# Patient Record
Sex: Female | Born: 1950 | Race: White | Hispanic: No | Smoking: Never smoker
Health system: Southern US, Community
[De-identification: ages and names within clinical notes are randomized; demographics above are authoritative.]

## PROBLEM LIST (undated history)

## (undated) DIAGNOSIS — T7840XA Allergy, unspecified, initial encounter: Secondary | ICD-10-CM

## (undated) DIAGNOSIS — F32A Depression, unspecified: Secondary | ICD-10-CM

## (undated) DIAGNOSIS — D219 Benign neoplasm of connective and other soft tissue, unspecified: Secondary | ICD-10-CM

## (undated) DIAGNOSIS — F329 Major depressive disorder, single episode, unspecified: Secondary | ICD-10-CM

## (undated) DIAGNOSIS — F419 Anxiety disorder, unspecified: Secondary | ICD-10-CM

## (undated) DIAGNOSIS — K219 Gastro-esophageal reflux disease without esophagitis: Secondary | ICD-10-CM

## (undated) DIAGNOSIS — S3992XA Unspecified injury of lower back, initial encounter: Secondary | ICD-10-CM

## (undated) DIAGNOSIS — E785 Hyperlipidemia, unspecified: Secondary | ICD-10-CM

## (undated) DIAGNOSIS — I10 Essential (primary) hypertension: Secondary | ICD-10-CM

## (undated) HISTORY — DX: Gastro-esophageal reflux disease without esophagitis: K21.9

## (undated) HISTORY — DX: Major depressive disorder, single episode, unspecified: F32.9

## (undated) HISTORY — DX: Essential (primary) hypertension: I10

## (undated) HISTORY — PX: MYOMECTOMY: SHX85

## (undated) HISTORY — PX: COLONOSCOPY: SHX174

## (undated) HISTORY — PX: INJECTION KNEE: SHX2446

## (undated) HISTORY — DX: Benign neoplasm of connective and other soft tissue, unspecified: D21.9

## (undated) HISTORY — DX: Allergy, unspecified, initial encounter: T78.40XA

## (undated) HISTORY — DX: Hyperlipidemia, unspecified: E78.5

## (undated) HISTORY — DX: Depression, unspecified: F32.A

## (undated) HISTORY — PX: ABDOMINAL HYSTERECTOMY: SHX81

## (undated) HISTORY — DX: Anxiety disorder, unspecified: F41.9

## (undated) HISTORY — PX: OTHER SURGICAL HISTORY: SHX169

---

## 1987-03-30 DIAGNOSIS — F419 Anxiety disorder, unspecified: Secondary | ICD-10-CM

## 1987-03-30 HISTORY — DX: Anxiety disorder, unspecified: F41.9

## 1998-06-13 ENCOUNTER — Ambulatory Visit (HOSPITAL_COMMUNITY): Admission: RE | Admit: 1998-06-13 | Discharge: 1998-06-13 | Payer: Self-pay | Admitting: General Surgery

## 1999-03-11 ENCOUNTER — Encounter: Admission: RE | Admit: 1999-03-11 | Discharge: 1999-03-11 | Payer: Self-pay | Admitting: Gynecology

## 1999-03-11 ENCOUNTER — Encounter: Payer: Self-pay | Admitting: Gynecology

## 1999-06-26 ENCOUNTER — Encounter (INDEPENDENT_AMBULATORY_CARE_PROVIDER_SITE_OTHER): Payer: Self-pay | Admitting: Specialist

## 1999-06-26 ENCOUNTER — Ambulatory Visit (HOSPITAL_COMMUNITY): Admission: RE | Admit: 1999-06-26 | Discharge: 1999-06-26 | Payer: Self-pay | Admitting: General Surgery

## 2000-03-09 ENCOUNTER — Other Ambulatory Visit: Admission: RE | Admit: 2000-03-09 | Discharge: 2000-03-09 | Payer: Self-pay | Admitting: Gynecology

## 2000-06-22 ENCOUNTER — Encounter: Admission: RE | Admit: 2000-06-22 | Discharge: 2000-06-22 | Payer: Self-pay | Admitting: Gynecology

## 2000-06-22 ENCOUNTER — Encounter: Payer: Self-pay | Admitting: Gynecology

## 2001-04-10 ENCOUNTER — Other Ambulatory Visit: Admission: RE | Admit: 2001-04-10 | Discharge: 2001-04-10 | Payer: Self-pay | Admitting: Gynecology

## 2001-09-05 ENCOUNTER — Encounter: Admission: RE | Admit: 2001-09-05 | Discharge: 2001-09-05 | Payer: Self-pay | Admitting: Gynecology

## 2001-09-05 ENCOUNTER — Encounter: Payer: Self-pay | Admitting: Gynecology

## 2002-05-07 ENCOUNTER — Other Ambulatory Visit: Admission: RE | Admit: 2002-05-07 | Discharge: 2002-05-07 | Payer: Self-pay | Admitting: Gynecology

## 2002-10-17 ENCOUNTER — Encounter: Admission: RE | Admit: 2002-10-17 | Discharge: 2002-10-17 | Payer: Self-pay | Admitting: Gynecology

## 2002-10-17 ENCOUNTER — Encounter: Payer: Self-pay | Admitting: Gynecology

## 2003-05-13 ENCOUNTER — Other Ambulatory Visit: Admission: RE | Admit: 2003-05-13 | Discharge: 2003-05-13 | Payer: Self-pay | Admitting: Gynecology

## 2004-01-08 ENCOUNTER — Encounter: Admission: RE | Admit: 2004-01-08 | Discharge: 2004-01-08 | Payer: Self-pay | Admitting: Gynecology

## 2004-05-13 ENCOUNTER — Other Ambulatory Visit: Admission: RE | Admit: 2004-05-13 | Discharge: 2004-05-13 | Payer: Self-pay | Admitting: Gynecology

## 2005-03-16 ENCOUNTER — Encounter: Admission: RE | Admit: 2005-03-16 | Discharge: 2005-03-16 | Payer: Self-pay | Admitting: Gynecology

## 2005-04-09 ENCOUNTER — Encounter: Admission: RE | Admit: 2005-04-09 | Discharge: 2005-04-09 | Payer: Self-pay | Admitting: Gynecology

## 2005-05-17 ENCOUNTER — Other Ambulatory Visit: Admission: RE | Admit: 2005-05-17 | Discharge: 2005-05-17 | Payer: Self-pay | Admitting: Gynecology

## 2005-10-19 ENCOUNTER — Other Ambulatory Visit: Admission: RE | Admit: 2005-10-19 | Discharge: 2005-10-19 | Payer: Self-pay | Admitting: Gynecology

## 2006-04-08 ENCOUNTER — Encounter: Admission: RE | Admit: 2006-04-08 | Discharge: 2006-04-08 | Payer: Self-pay | Admitting: Gynecology

## 2006-06-13 ENCOUNTER — Other Ambulatory Visit: Admission: RE | Admit: 2006-06-13 | Discharge: 2006-06-13 | Payer: Self-pay | Admitting: Gynecology

## 2006-12-26 ENCOUNTER — Other Ambulatory Visit: Admission: RE | Admit: 2006-12-26 | Discharge: 2006-12-26 | Payer: Self-pay | Admitting: Gynecology

## 2007-02-17 ENCOUNTER — Ambulatory Visit (HOSPITAL_BASED_OUTPATIENT_CLINIC_OR_DEPARTMENT_OTHER): Admission: RE | Admit: 2007-02-17 | Discharge: 2007-02-17 | Payer: Self-pay | Admitting: Gynecology

## 2007-05-15 ENCOUNTER — Encounter: Admission: RE | Admit: 2007-05-15 | Discharge: 2007-05-15 | Payer: Self-pay | Admitting: Gynecology

## 2007-11-21 ENCOUNTER — Encounter: Admission: RE | Admit: 2007-11-21 | Discharge: 2007-11-21 | Payer: Self-pay | Admitting: Emergency Medicine

## 2008-02-14 ENCOUNTER — Encounter: Admission: RE | Admit: 2008-02-14 | Discharge: 2008-02-14 | Payer: Self-pay | Admitting: Emergency Medicine

## 2008-06-11 ENCOUNTER — Encounter: Admission: RE | Admit: 2008-06-11 | Discharge: 2008-06-11 | Payer: Self-pay | Admitting: Gynecology

## 2009-06-12 ENCOUNTER — Encounter: Admission: RE | Admit: 2009-06-12 | Discharge: 2009-06-12 | Payer: Self-pay | Admitting: Gynecology

## 2009-11-11 ENCOUNTER — Ambulatory Visit: Payer: Self-pay | Admitting: Internal Medicine

## 2009-11-11 DIAGNOSIS — E785 Hyperlipidemia, unspecified: Secondary | ICD-10-CM | POA: Insufficient documentation

## 2009-11-11 DIAGNOSIS — J309 Allergic rhinitis, unspecified: Secondary | ICD-10-CM | POA: Insufficient documentation

## 2009-11-11 DIAGNOSIS — F4310 Post-traumatic stress disorder, unspecified: Secondary | ICD-10-CM | POA: Insufficient documentation

## 2009-11-11 DIAGNOSIS — I10 Essential (primary) hypertension: Secondary | ICD-10-CM | POA: Insufficient documentation

## 2009-11-11 DIAGNOSIS — K219 Gastro-esophageal reflux disease without esophagitis: Secondary | ICD-10-CM

## 2009-11-11 HISTORY — DX: Post-traumatic stress disorder, unspecified: F43.10

## 2009-11-11 HISTORY — DX: Gastro-esophageal reflux disease without esophagitis: K21.9

## 2009-12-22 ENCOUNTER — Ambulatory Visit: Payer: Self-pay | Admitting: Internal Medicine

## 2010-04-19 ENCOUNTER — Encounter: Payer: Self-pay | Admitting: Emergency Medicine

## 2010-04-19 ENCOUNTER — Encounter: Payer: Self-pay | Admitting: Gynecology

## 2010-04-26 LAB — CONVERTED CEMR LAB
ALT: 20 units/L (ref 0–35)
Albumin: 3.9 g/dL (ref 3.5–5.2)
Alkaline Phosphatase: 41 units/L (ref 39–117)
Basophils Absolute: 0.1 10*3/uL (ref 0.0–0.1)
Basophils Relative: 0.8 % (ref 0.0–3.0)
Bilirubin, Direct: 0.1 mg/dL (ref 0.0–0.3)
CO2: 25 meq/L (ref 19–32)
Calcium: 8.8 mg/dL (ref 8.4–10.5)
Chloride: 104 meq/L (ref 96–112)
Cholesterol, target level: 200 mg/dL
Creatinine, Ser: 0.8 mg/dL (ref 0.4–1.2)
Eosinophils Absolute: 0.2 10*3/uL (ref 0.0–0.7)
Glucose, Bld: 94 mg/dL (ref 70–99)
Lymphocytes Relative: 39.4 % (ref 12.0–46.0)
MCHC: 34.3 g/dL (ref 30.0–36.0)
MCV: 96.7 fL (ref 78.0–100.0)
Monocytes Absolute: 0.5 10*3/uL (ref 0.1–1.0)
Neutrophils Relative %: 49.5 % (ref 43.0–77.0)
Platelets: 216 10*3/uL (ref 150.0–400.0)
RBC: 4.01 M/uL (ref 3.87–5.11)
Total Protein: 6.4 g/dL (ref 6.0–8.3)
WBC: 7.1 10*3/uL (ref 4.5–10.5)

## 2010-04-28 NOTE — Assessment & Plan Note (Signed)
Summary: new to estab/cbs   Vital Signs:  Patient profile:   60 year old female Height:      67 inches (170.18 cm) Weight:      165.25 pounds (75.11 kg) BMI:     25.98 Temp:     98.1 degrees F (36.72 degrees C) oral Pulse rate:   76 / minute Pulse rhythm:   regular Resp:     20 per minute BP sitting:   138 / 88  (left arm) Cuff size:   regular  Vitals Entered By: Brenton Grills MA (November 11, 2009 2:12 PM)  CC: New Pt/aj, General Medical Evaluation, Lipid Management   CC:  New Pt/aj, General Medical Evaluation, and Lipid Management.  History of Present Illness: Jasmine Bray is here to establish as a new patient; she is  essentially asymptomatic . Hyperlipidemia Follow-Up      This is a 60 year old woman who presents for Hyperlipidemia follow-up.  The patient reports constipation( chronic unrelated to statin), but denies muscle aches, GI upset, abdominal pain, flushing, itching, diarrhea, and fatigue.  The patient denies the following symptoms: chest pain/pressure, exercise intolerance, dypsnea, palpitations, syncope, and pedal edema.  Compliance with medications (by patient report) has been near 100%.  Dietary compliance has been good.  Adjunctive measures currently used by the patient include ASA and fish oil supplements.                                                                   Hypertension Follow-Up      The patient also presents for Hypertension follow-up.  The patient reports urinary frequency with HCTZ , but denies lightheadedness, headaches, edema, rash, and fatigue.  Compliance with medications (by patient report) has been near 100%.  Adjunctive measures currently used by the patient include salt restriction.  BP @ home 125/75 on average.  Lipid Management History:      Positive NCEP/ATP III risk factors include female age 61 years old or older and hypertension.  Negative NCEP/ATP III risk factors include no history of early menopause without estrogen hormone replacement,  non-diabetic, no family history for ischemic heart disease, non-tobacco-user status, no ASHD (atherosclerotic heart disease), no prior stroke/TIA, no peripheral vascular disease, and no history of aortic aneurysm.     Preventive Screening-Counseling & Management  Alcohol-Tobacco     Smoking Status: never  Caffeine-Diet-Exercise     Does Patient Exercise: yes  Current Medications (verified): 1)  Valacyclovir Hcl 1 Gm Tabs (Valacyclovir Hcl) .Marland Kitchen.. 1 Tablet By Mouth Once Daily 2)  Simvastatin 40 Mg Tabs (Simvastatin) .Marland Kitchen.. 1 Tablet By Mouth Once Daily 3)  Alprazolam 0.5 Mg Tabs (Alprazolam) .Marland Kitchen.. 1 Tablet By Mouth Two Times A Day 4)  Lexapro 20 Mg Tabs (Escitalopram Oxalate) .Marland Kitchen.. 1 Tablet By Mouth Once Daily 5)  Hydrochlorothiazide 12.5 Mg Caps (Hydrochlorothiazide) .Marland Kitchen.. 1 Capsule Once Daily 6)  Estrace 0.1 Mg/gm Crea (Estradiol) .Marland Kitchen.. 1 Gram Three Times A Week 7)  Evamist 1.53 Mg/spray Soln (Estradiol) .... 2 Puffs Daily  Allergies (verified): No Known Drug Allergies  Past History:  Past Medical History: Situational anxiety/Depression  , post trauma Hyperlipidemia: [place] Study LDL goal = < 130. Hypertension Allergic rhinitis GERD  Past Surgical History: Myomectomy for fibroids ; Anal fistula ectomy Hysterectomy &  BSO for fibroids; G1 M1  Family History: Father: alcoholism,  Mother: arthritis, CVA Siblings: 2 sisters HTN & lipids; P aunt: breast cancer;Paternal GPs: CAD;MGM: DM;PGM : CVA  Social History: Occupation: Sales Single Never Smoked Alcohol use-yes: socially Regular exercise-yes: yoga, swimming, gym ( weights & aerobics) Smoking Status:  never Does Patient Exercise:  yes  Review of Systems  The patient denies anorexia, fever, vision loss, decreased hearing, hoarseness, prolonged cough, hemoptysis, abdominal pain, melena, hematochezia, severe indigestion/heartburn, hematuria, unusual weight change, abnormal bleeding, enlarged lymph nodes, and angioedema.          Weight down 7# with recent stress. Garlic & other supplements have caused easier bleeding.Intermittent  recent R chest pain post yoga  Physical Exam  General:  well-nourished; alert,appropriate and cooperative throughout examination Head:  Normocephalic and atraumatic without obvious abnormalities. Eyes:  No corneal or conjunctival inflammation noted. Perrla. Funduscopic exam benign, without hemorrhages, exudates or papilledema. Faint pterygia Ears:  External ear exam shows no significant lesions or deformities.  Otoscopic examination reveals clear canals, tympanic membranes are intact bilaterally without bulging, retraction, inflammation or discharge. Hearing is grossly normal bilaterally. Nose:  External nasal examination shows no deformity or inflammation. Nasal mucosa are pink and moist without lesions or exudates. Mouth:  Oral mucosa and oropharynx without lesions or exudates.  Teeth in good repair. Neck:  No deformities, masses, or tenderness noted. Chest Wall:  Slight R discomfort  to pressure R chest Lungs:  Normal respiratory effort, chest expands symmetrically. Lungs are clear to auscultation, no crackles or wheezes. Heart:  Normal rate and regular rhythm. S1 and S2 normal without gallop, murmur, click, rub.S4 Abdomen:  Bowel sounds positive,abdomen soft and non-tender without masses, organomegaly or hernias noted. Genitalia:  Pamelia Hoit, NP Pulses:  R and L carotid,radial,dorsalis pedis and posterior tibial pulses are full and equal bilaterally Extremities:  No clubbing, cyanosis, edema. Minor DIP deformities  noted with normal full range of motion of all joints.   Neurologic:  alert & oriented X3 and DTRs symmetrical and normal.   Skin:  Intact without suspicious lesions or rashes Cervical Nodes:  No lymphadenopathy noted Axillary Nodes:  No palpable lymphadenopathy Psych:  memory intact for recent and remote, normally interactive, good eye contact, not anxious  appearing, and not depressed appearing.     Impression & Recommendations:  Problem # 1:  ROUTINE GENERAL MEDICAL EXAM@[email or web address]  CARE FACL (ICD-V70.0)  Problem # 2:  HYPERTENSION (ICD-401.9) controlled Her updated medication list for this problem includes:    Hydrochlorothiazide 12.5 Mg Caps (Hydrochlorothiazide) .Marland Kitchen... 1 capsule once daily  Problem # 3:  HYPERLIPIDEMIA (ICD-272.4)  Her updated medication list for this problem includes:    Simvastatin 40 Mg Tabs (Simvastatin) .Marland Kitchen... 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 1 tablet by mouth once daily 2)  Simvastatin 40 Mg Tabs (Simvastatin) .Marland Kitchen.. 1 tablet by mouth once daily 3)  Alprazolam 0.5 Mg Tabs (Alprazolam) .Marland Kitchen.. 1 tablet by mouth two times a day 4)  Lexapro 20 Mg Tabs (Escitalopram oxalate) .Marland Kitchen.. 1 tablet by mouth once daily 5)  Hydrochlorothiazide 12.5 Mg Caps (Hydrochlorothiazide) .Marland Kitchen.. 1 capsule once daily 6)  Estrace 0.1 Mg/gm Crea (Estradiol) .Marland Kitchen.. 1 gram three times a week 7)  Evamist 1.53 Mg/spray Soln (Estradiol) .... 2 puffs daily  Lipid Assessment/Plan:      Based  on NCEP/ATP III, the patient's risk factor category is "2 or more risk factors and a calculated 10 year CAD risk of > 20%".  The patient's lipid goals are as follows: Total cholesterol goal is 200; LDL cholesterol goal is 130; HDL cholesterol goal is 40; Triglyceride goal is 150.    Patient Instructions: 1)  Consume < 30 grams of Hugh Fructose Corn Syrup sugar  / day  as discussed.  2)  Check your Blood Pressure regularly. If it is above:135/85 ON AVERAGE  you should make an appointment. Schedule fasting labs: 3)  BMP ; 4)  Hepatic Panel; 5)  NMR Lipoprofile Lipid Panel ; 6)  TSH ; 7)  CBC w/ Diff.   Preventive Care Screening  Mammogram:    Date:  06/27/2009     Results:  normal   Pap Smear:    Date:  05/27/2009    Results:  normal    Appended Document: new to estab/cbs     Allergies: No Known Drug Allergies   Complete Medication List: 1)  Valacyclovir Hcl 1 Gm Tabs (Valacyclovir hcl) .Marland Kitchen.. 1 tablet by mouth once daily 2)  Simvastatin 40 Mg Tabs (Simvastatin) .Marland Kitchen.. 1 tablet by mouth once daily 3)  Alprazolam 0.5 Mg Tabs (Alprazolam) .Marland Kitchen.. 1 tablet by mouth two times a day 4)  Lexapro 20 Mg Tabs (Escitalopram oxalate) .Marland Kitchen.. 1 tablet by mouth once daily 5)  Hydrochlorothiazide 12.5 Mg Caps (Hydrochlorothiazide) .Marland Kitchen.. 1 capsule once daily 6)  Estrace 0.1 Mg/gm Crea (Estradiol) .Marland Kitchen.. 1 gram three times a week 7)  Evamist 1.53 Mg/spray Soln (Estradiol) .... 2 puffs daily  Other Orders: Tdap => 85yrs IM (16109) Admin 1st Vaccine (60454)    Immunizations Administered:  Tetanus Vaccine:    Vaccine Type: Tdap    Site: right deltoid    Mfr: GlaxoSmithKline    Dose: 0.5 ml    Route: IM    Given by: Brenton Grills MA    Exp. Date: 06/21/2011    Lot #: [id number]    VIS given: 02/14/07 version given November 11, 2009.

## 2010-04-28 NOTE — Assessment & Plan Note (Signed)
Summary: FLU SHOT/KN  Nurse Visit   Allergies: No Known Drug Allergies  Orders Added: 1)  Admin 1st Vaccine [90471] 2)  Flu Vaccine 22yrs + [16109] Flu Vaccine Consent Questions     Do you have a history of severe allergic reactions to this vaccine? no    Any prior history of allergic reactions to egg and/or gelatin? no    Do you have a sensitivity to the preservative Thimersol? no    Do you have a past history of Guillan-Barre Syndrome? no    Do you currently have an acute febrile illness? no    Have you ever had a severe reaction to latex? no    Vaccine information given and explained to patient? yes    Are you currently pregnant? no    Lot Number:[id number]   Exp Date:09/26/2010   Site Given  Left Deltoid IM

## 2010-06-01 ENCOUNTER — Other Ambulatory Visit: Payer: Self-pay | Admitting: Gynecology

## 2010-06-01 DIAGNOSIS — Z1231 Encounter for screening mammogram for malignant neoplasm of breast: Secondary | ICD-10-CM

## 2010-06-16 ENCOUNTER — Ambulatory Visit: Payer: Self-pay

## 2010-06-19 ENCOUNTER — Ambulatory Visit
Admission: RE | Admit: 2010-06-19 | Discharge: 2010-06-19 | Disposition: A | Discharge status: Home / Self Care | Payer: Commercial Managed Care - PPO | Source: Ambulatory Visit | Attending: Gynecology | Admitting: Gynecology

## 2010-06-19 DIAGNOSIS — Z1231 Encounter for screening mammogram for malignant neoplasm of breast: Secondary | ICD-10-CM

## 2010-07-06 ENCOUNTER — Other Ambulatory Visit: Payer: Self-pay | Admitting: Gynecology

## 2010-08-11 NOTE — Op Note (Signed)
Jasmine Bray, Jasmine Bray               ACCOUNT NO.:  [id number]   MEDICAL RECORD NO.:  [id number]         PATIENT TYPE:  HAMB   LOCATION:                               FACILITY:  [facility]   PHYSICIAN:  Gretta Cool, M.D. DATE OF BIRTH:  31-Dec-1950   DATE OF PROCEDURE:  02/17/2007  DATE OF DISCHARGE:                               OPERATIVE REPORT   PREOPERATIVE DIAGNOSES:  Vaginal intraepithelial neoplasia 2, high-  grade, persistent for greater than two years.   POSTOPERATIVE DIAGNOSES:  Vaginal intraepithelial neoplasia 2, high-  grade, persistent for greater than two years.   PROCEDURE:  Laser ablation of vaginal intraepithelial neoplasia 2.   SURGEON:  Gretta Cool, M.D.   ANESTHESIA:  IV sedation and local infiltration.   DESCRIPTION OF PROCEDURE:  With the patient prepped and draped in Allen  stirrups in lithotomy position, with wet towels applied to her perineum,  the area of abnormality was identified and outlined by Lugol's iodine.  There was no other evidence of skip lesions, other than a patch  approximately the size of 2 by 3 cm.  There was one area of raised white  epithelium in the center, approximately 3 by 3 mm.  At this point, the  entire area was treated by laser vaporization, down to full-thickness  depth.  The entire area of VAIN 2 was treated with a margin of 4-5 mm  around the entire abnormal surface.  At the end of the procedure, there  were no complications.  There was no significant bleeding.  Procedure  was terminated without complication, the area cleaned thoroughly of  carbon and debris.  At this point, the patient returned to the recovery  room in excellent condition.           ______________________________  Gretta Cool, M.D.     CWL/MEDQ  D:  02/17/2007  T:  02/17/2007  Job:  [id number]

## 2010-12-02 ENCOUNTER — Other Ambulatory Visit: Payer: Self-pay | Admitting: Internal Medicine

## 2010-12-02 MED ORDER — HYDROCHLOROTHIAZIDE 12.5 MG PO CAPS: 12.5000 mg | ORAL_CAPSULE | Freq: Every day | ORAL | Status: DC

## 2010-12-02 NOTE — Telephone Encounter (Signed)
RX sent

## 2010-12-02 NOTE — Telephone Encounter (Signed)
Patient needs 30 day supply of hydrochlorotjiazide 12.5 mg - [facility] - [address]

## 2011-01-04 ENCOUNTER — Encounter: Payer: Self-pay | Admitting: Internal Medicine

## 2011-01-05 ENCOUNTER — Ambulatory Visit (INDEPENDENT_AMBULATORY_CARE_PROVIDER_SITE_OTHER): Payer: Commercial Managed Care - PPO | Admitting: Internal Medicine

## 2011-01-05 ENCOUNTER — Encounter: Payer: Self-pay | Admitting: Internal Medicine

## 2011-01-05 VITALS — BP 118/76 | HR 60 | Temp 97.7°F | Resp 12 | Ht 67.25 in | Wt 167.4 lb

## 2011-01-05 DIAGNOSIS — E785 Hyperlipidemia, unspecified: Secondary | ICD-10-CM

## 2011-01-05 DIAGNOSIS — I10 Essential (primary) hypertension: Secondary | ICD-10-CM

## 2011-01-05 DIAGNOSIS — F4310 Post-traumatic stress disorder, unspecified: Secondary | ICD-10-CM

## 2011-01-05 DIAGNOSIS — Z0000 Encounter for general adult medical examination without abnormal findings: Secondary | ICD-10-CM

## 2011-01-05 LAB — CBC WITH DIFFERENTIAL/PLATELET
Basophils Relative: 0.7 % (ref 0.0–3.0)
Eosinophils Relative: 4.3 % (ref 0.0–5.0)
HCT: 41.3 % (ref 36.0–46.0)
Hemoglobin: 13.6 g/dL (ref 12.0–15.0)
Lymphs Abs: 2.2 10*3/uL (ref 0.7–4.0)
MCV: 98.4 fl (ref 78.0–100.0)
Monocytes Absolute: 0.4 10*3/uL (ref 0.1–1.0)
Monocytes Relative: 7.3 % (ref 3.0–12.0)
Neutro Abs: 2.9 10*3/uL (ref 1.4–7.7)
Platelets: 238 10*3/uL (ref 150.0–400.0)
RBC: 4.2 Mil/uL (ref 3.87–5.11)
WBC: 5.9 10*3/uL (ref 4.5–10.5)

## 2011-01-05 LAB — LIPID PANEL
Cholesterol: 183 mg/dL (ref 0–200)
LDL Cholesterol: 90 mg/dL (ref 0–99)
Total CHOL/HDL Ratio: 3
VLDL: 22.8 mg/dL (ref 0.0–40.0)

## 2011-01-05 LAB — HEPATIC FUNCTION PANEL
ALT: 31 U/L (ref 0–35)
Bilirubin, Direct: 0 mg/dL (ref 0.0–0.3)
Total Bilirubin: 0.6 mg/dL (ref 0.3–1.2)

## 2011-01-05 LAB — POCT HEMOGLOBIN-HEMACUE
Hemoglobin: 13.9
Operator id: 268271

## 2011-01-05 LAB — BASIC METABOLIC PANEL
Chloride: 102 mEq/L (ref 96–112)
GFR: 74.31 mL/min (ref >60.00)
Potassium: 3.8 mEq/L (ref 3.5–5.1)
Sodium: 140 mEq/L (ref 135–145)

## 2011-01-05 MED ORDER — HYDROCHLOROTHIAZIDE 12.5 MG PO CAPS: 12.5000 mg | ORAL_CAPSULE | Freq: Every day | ORAL | Status: DC

## 2011-01-05 MED ORDER — SIMVASTATIN 40 MG PO TABS: 40.0000 mg | ORAL_TABLET | Freq: Every day | ORAL | Status: DC

## 2011-01-05 NOTE — Progress Notes (Signed)
Subjective:    Patient ID: Jasmine Bray, female    DOB: 1950/11/16, 60 y.o.   MRN: [id number]  HPI  Jasmine Bray  is here for a physical; she has no acute issues.      Review of Systems HYPERTENSION: Disease Monitoring: Blood pressure range- 120- 130/69 -72  Chest pain, palpitations- no       Dyspnea- no Medications: Compliance- yes  Lightheadedness,Syncope- no    Edema- no   HYPERLIPIDEMIA: Disease Monitoring: See symptoms for Hypertension Medications: Compliance- yes  Abd pain, bowel changes- no but fistula may be recurring Muscle aches- no     ROS See HPI above   PMH Smoking Status noted         Objective:   Physical Exam Gen.: Healthy and well-nourished in appearance. Alert, appropriate and cooperative throughout exam. She appears much younger than her stated age Head: Normocephalic without obvious abnormalities  Eyes: No corneal or conjunctival inflammation noted. Pupils equal round reactive to light and accommodation. Fundal exam is benign without hemorrhages, exudate, papilledema. Extraocular motion intact. Vision grossly normal. Mild pterygia present medially over the sclera. Ears: External  ear exam reveals no significant lesions or deformities. Canals clear .TMs normal. Hearing is grossly normal bilaterally. Nose: External nasal exam reveals no deformity or inflammation. Nasal mucosa are pink and moist. No lesions or exudates noted.  Mouth: Oral mucosa and oropharynx reveal no lesions or exudates. Teeth in good repair. Neck: No deformities, masses, or tenderness noted. Range of motion &  Thyroid normal. Lungs: Normal respiratory effort; chest expands symmetrically. Lungs are clear to auscultation without rales, wheezes, or increased work of breathing. Heart: Normal rate and rhythm. Normal S1 and S2. No gallop, click, or rub. S 4 with slight slurring murmur. Abdomen: Bowel sounds normal; abdomen soft and nontender. No masses, organomegaly or hernias  noted. Genitalia: Dr Nicholas Lose   .                                                                                   Musculoskeletal/extremities: No deformity or scoliosis noted of  the thoracic or lumbar spine; slight muscle asymmetry. No clubbing, cyanosis, edema  noted. Range of motion  normal .Tone & strength  Normal.Joints:mixed arthritic hand changes . Nail health  good. Vascular: Carotid, radial artery, dorsalis pedis and  posterior tibial pulses are full and equal. No bruits present. Neurologic: Alert and oriented x3. Deep tendon reflexes symmetrical and normal.         Skin: Intact without suspicious lesions or rashes. Lymph: No cervical, axillary lymphadenopathy present. Psych: Mood and affect are normal. Normally interactive                                                                                         Assessment & Plan:  #1 comprehensive physical exam; no acute  findings #2 see Problem List with Assessments & Recommendations Plan: see Orders

## 2011-01-05 NOTE — Patient Instructions (Signed)
Preventive Health Care: Exercise  30-45  minutes a day, 3-4 days a week. Walking is especially valuable in preventing Osteoporosis. Eat a low-fat diet with lots of fruits and vegetables, up to 7-9 servings per day. Consume less than 30 grams of sugar per day from foods & drinks with High Fructose Corn Syrup as # 1,2,3 or #4 on label. Alcohol If you drink, do it moderately - 1 drink per day or less.  Health Care Power of Attorney & Living Will place you in charge of your health care  decisions. Verify these are  in place.

## 2011-01-11 LAB — HEMOGLOBIN A1C: Hgb A1c MFr Bld: 5.5 % (ref 4.6–6.5)

## 2011-02-02 ENCOUNTER — Ambulatory Visit (INDEPENDENT_AMBULATORY_CARE_PROVIDER_SITE_OTHER): Payer: Commercial Managed Care - PPO

## 2011-02-02 DIAGNOSIS — Z23 Encounter for immunization: Secondary | ICD-10-CM

## 2011-05-20 ENCOUNTER — Other Ambulatory Visit: Payer: Self-pay | Admitting: Gynecology

## 2011-05-20 DIAGNOSIS — Z1231 Encounter for screening mammogram for malignant neoplasm of breast: Secondary | ICD-10-CM

## 2011-06-21 ENCOUNTER — Ambulatory Visit: Payer: Commercial Managed Care - PPO

## 2011-06-22 ENCOUNTER — Ambulatory Visit
Admission: RE | Admit: 2011-06-22 | Discharge: 2011-06-22 | Disposition: A | Discharge status: Home / Self Care | Payer: Commercial Managed Care - PPO | Source: Ambulatory Visit | Attending: Gynecology | Admitting: Gynecology

## 2011-06-22 DIAGNOSIS — Z1231 Encounter for screening mammogram for malignant neoplasm of breast: Secondary | ICD-10-CM

## 2011-06-29 ENCOUNTER — Ambulatory Visit (INDEPENDENT_AMBULATORY_CARE_PROVIDER_SITE_OTHER): Payer: Commercial Managed Care - PPO | Admitting: Internal Medicine

## 2011-06-29 ENCOUNTER — Encounter: Payer: Self-pay | Admitting: Internal Medicine

## 2011-06-29 VITALS — BP 112/72 | HR 72 | Temp 97.7°F | Wt 155.4 lb

## 2011-06-29 DIAGNOSIS — M5412 Radiculopathy, cervical region: Secondary | ICD-10-CM

## 2011-06-29 MED ORDER — SIMVASTATIN 40 MG PO TABS: 40.0000 mg | ORAL_TABLET | Freq: Every day | ORAL | Status: DC

## 2011-06-29 MED ORDER — HYDROCHLOROTHIAZIDE 12.5 MG PO CAPS: 12.5000 mg | ORAL_CAPSULE | Freq: Every day | ORAL | Status: DC

## 2011-06-29 MED ORDER — GABAPENTIN 100 MG PO CAPS: 100.0000 mg | ORAL_CAPSULE | Freq: Three times a day (TID) | ORAL | Status: DC

## 2011-06-29 MED ORDER — TRAMADOL HCL 50 MG PO TABS: 50.0000 mg | ORAL_TABLET | Freq: Four times a day (QID) | ORAL | Status: DC | PRN

## 2011-06-29 MED ORDER — CYCLOBENZAPRINE HCL 5 MG PO TABS: ORAL_TABLET | ORAL | Status: DC

## 2011-06-29 NOTE — Progress Notes (Signed)
  Subjective:    Patient ID: Jasmine Bray, female    DOB: 1950/05/18, 61 y.o.   MRN: [id number]  HPI NECK PAIN: Location: L posterior scalp line   Onset:1 mo ago  Severity: up to 8 Pain is described NG:[id number] Worse with: riding gaiting  Horse & planks in yoga  Better with: NSAIDS with minimal response Pain radiates to:to LUE to elbow intermittentlly & to L jaw & ear   Impaired range of motion: yes  History of repetitive motion: no History of overuse or hyperextension: no History of trauma: no   Past history of similar problem:  no  Symptoms Back Pain:  no  Numbness/tingling:  no  Weakness: no  Red Flags Fever: no Headache:  no  Bowel/bladder dysfunction:  no      Review of Systems she denies weight loss, blurred vision, double vision, or loss of vision. She also denies tinnitus or hearing loss. She's had no hoarseness or trouble swallowing No associated rash     Objective:   Physical Exam  Gen. appearance: Well-nourished, in no distress Eyes: Extraocular motion intact, field of vision normal, vision grossly intact, no nystagmus. Pterygium OS ENT: Canals clear, tympanic membranes normal,  hearing grossly normal Neck: Normal range of motion but ROM cause pain, no masses, normal thyroid. She is tender over the lateral neck Cardiovascular: Rate and rhythm normal; no murmurs, gallops or extra heart sounds Muscle skeletal: Range of motion, tone, &  strength normal. She has osteoarthritic changes in her hands which are mild Neuro:no cranial nerve deficit, deep tendon  reflexes normal, gait normal Lymph: No cervical or axillary LA Skin: Warm and dry without suspicious lesions or rashes. Romberg and finger to nose testing are negative Psych: no anxiety or mood change. Normally interactive and cooperative.         Assessment & Plan:  #1 cervical radiculopathy on the left without neurovascular deficit on exam. 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

## 2011-06-29 NOTE — Patient Instructions (Addendum)
Do not take  Tramadol  within 8-12 hours the sertraline. Order for x-rays entered into  the computer; these will be performed at [address] across from [facility]. No appointment is necessary.  Assess response to the gabapentin one every 8 hours as needed. If it is partially beneficial, it can be increased up to a total of 3 pills every 8 hours as needed. This increase of 1 pill each dose  should take place over 72 hours at least.

## 2011-06-30 ENCOUNTER — Ambulatory Visit (INDEPENDENT_AMBULATORY_CARE_PROVIDER_SITE_OTHER)
Admission: RE | Admit: 2011-06-30 | Discharge: 2011-06-30 | Disposition: A | Discharge status: Home / Self Care | Payer: Commercial Managed Care - PPO | Source: Ambulatory Visit | Attending: Internal Medicine | Admitting: Internal Medicine

## 2011-06-30 DIAGNOSIS — M5412 Radiculopathy, cervical region: Secondary | ICD-10-CM

## 2011-07-01 ENCOUNTER — Telehealth: Payer: Self-pay | Admitting: Internal Medicine

## 2011-07-01 NOTE — Telephone Encounter (Signed)
Patient called & would like her results from her Xray as soon as they are available Patient PH# [phone]

## 2011-07-01 NOTE — Telephone Encounter (Signed)
I spoke with patient, discussed all information on xray report. Patient indicated she will wait and observe pain. Patient will call if she feels the need for a MRI

## 2011-07-01 NOTE — Telephone Encounter (Signed)
Dr.Hopper please advise 

## 2011-12-14 ENCOUNTER — Encounter: Payer: Self-pay | Admitting: Internal Medicine

## 2012-01-10 ENCOUNTER — Ambulatory Visit (INDEPENDENT_AMBULATORY_CARE_PROVIDER_SITE_OTHER): Payer: Commercial Managed Care - PPO | Admitting: Internal Medicine

## 2012-01-10 ENCOUNTER — Encounter: Payer: Self-pay | Admitting: Internal Medicine

## 2012-01-10 VITALS — BP 128/84 | HR 57 | Temp 98.3°F | Resp 14 | Ht 68.03 in | Wt 167.8 lb

## 2012-01-10 DIAGNOSIS — E785 Hyperlipidemia, unspecified: Secondary | ICD-10-CM

## 2012-01-10 DIAGNOSIS — R7301 Impaired fasting glucose: Secondary | ICD-10-CM | POA: Insufficient documentation

## 2012-01-10 DIAGNOSIS — Z0000 Encounter for general adult medical examination without abnormal findings: Secondary | ICD-10-CM

## 2012-01-10 DIAGNOSIS — Z23 Encounter for immunization: Secondary | ICD-10-CM

## 2012-01-10 DIAGNOSIS — I10 Essential (primary) hypertension: Secondary | ICD-10-CM

## 2012-01-10 DIAGNOSIS — R7303 Prediabetes: Secondary | ICD-10-CM | POA: Insufficient documentation

## 2012-01-10 LAB — BASIC METABOLIC PANEL
CO2: 28 mEq/L (ref 19–32)
Calcium: 9.1 mg/dL (ref 8.4–10.5)
Chloride: 103 mEq/L (ref 96–112)
Glucose, Bld: 86 mg/dL (ref 70–99)
Potassium: 3.7 mEq/L (ref 3.5–5.1)
Sodium: 140 mEq/L (ref 135–145)

## 2012-01-10 LAB — CBC WITH DIFFERENTIAL/PLATELET
Basophils Absolute: 0.1 10*3/uL (ref 0.0–0.1)
Basophils Relative: 0.7 % (ref 0.0–3.0)
Eosinophils Absolute: 0.2 10*3/uL (ref 0.0–0.7)
HCT: 40.9 % (ref 36.0–46.0)
Hemoglobin: 13.7 g/dL (ref 12.0–15.0)
Lymphs Abs: 2.5 10*3/uL (ref 0.7–4.0)
MCHC: 33.4 g/dL (ref 30.0–36.0)
MCV: 96.5 fl (ref 78.0–100.0)
Monocytes Absolute: 0.6 10*3/uL (ref 0.1–1.0)
Neutro Abs: 3.8 10*3/uL (ref 1.4–7.7)
RBC: 4.24 Mil/uL (ref 3.87–5.11)
RDW: 13.2 % (ref 11.5–14.6)

## 2012-01-10 LAB — HEPATIC FUNCTION PANEL
Albumin: 4 g/dL (ref 3.5–5.2)
Alkaline Phosphatase: 37 U/L — ABNORMAL LOW (ref 39–117)
Total Protein: 7 g/dL (ref 6.0–8.3)

## 2012-01-10 LAB — LIPID PANEL
Cholesterol: 192 mg/dL (ref 0–200)
HDL: 63.9 mg/dL (ref >39.00)
VLDL: 20 mg/dL (ref 0.0–40.0)

## 2012-01-10 LAB — TSH: TSH: 1.95 u[IU]/mL (ref 0.35–5.50)

## 2012-01-10 MED ORDER — ERYTHROMYCIN 5 MG/GM OP OINT: TOPICAL_OINTMENT | Freq: Four times a day (QID) | OPHTHALMIC | Status: DC

## 2012-01-10 MED ORDER — HYDROCHLOROTHIAZIDE 12.5 MG PO CAPS: 12.5000 mg | ORAL_CAPSULE | Freq: Every day | ORAL | Status: DC

## 2012-01-10 NOTE — Progress Notes (Signed)
  Subjective:    Patient ID: Jasmine Bray, female    DOB: July 26, 1950, 61 y.o.   MRN: [id number]  HPI  Quinne is here for a physical;acute issues include possible sty.      Review of Systems HYPERTENSION: Disease Monitoring: Blood pressure average- 117/72 Chest pain, palpitations- no       Dyspnea- no Medications: Compliance- yes  Lightheadedness,Syncope- no    Edema- no  FASTING HYPERGLYCEMIA, PMH of:  Polyuria/phagia/dipsia- frequency     Visual problems- no  HYPERLIPIDEMIA: Disease Monitoring: See symptoms for Hypertension Medications: Compliance- yes  Abd pain, bowel changes- chronic constipation   Muscle aches- no         Objective:   Physical Exam Gen.:  well-nourished in appearance. Alert, appropriate and cooperative throughout exam. Head: Normocephalic without obvious abnormalities  Eyes: No corneal ,lid or conjunctival inflammation noted. Pupils equal round reactive to light and accommodation. Fundal exam is benign without hemorrhages, exudate, papilledema. Extraocular motion intact. Vision grossly normal. Ears: External  ear exam reveals no significant lesions or deformities. Canals clear .TMs normal. Hearing is grossly normal bilaterally. Nose: External nasal exam reveals no deformity or inflammation. Nasal mucosa are pink and moist. No lesions or exudates noted.   Mouth: Oral mucosa and oropharynx reveal no lesions or exudates. Teeth in good repair. Neck: No deformities, masses, or tenderness noted. Range of motion decreased.Thyroid normal. Lungs: Normal respiratory effort; chest expands symmetrically. Lungs are clear to auscultation without rales, wheezes, or increased work of breathing. Heart: Normal rate and rhythm. Normal S1 and S2. No gallop, click, or rub. S4 with slurring; no murmur. Abdomen: Bowel sounds normal; abdomen soft and nontender. No masses, organomegaly or hernias noted. Genitalia: Pamelia Hoit, NP                                                          Musculoskeletal/extremities: minimal lordosis noted of  the thoracic .There is some asymmetry of the posterior thoracic musculature suggesting occult scoliosis.  No clubbing, cyanosis,or edema noted. Range of motion  normal .Tone & strength  Normal.Joints : mixed PIP/DIP changes. Nail health  good. Vascular: Carotid, radial artery, dorsalis pedis and  posterior tibial pulses are full and equal. No bruits present. Neurologic: Alert and oriented x3. Deep tendon reflexes symmetrical and normal.          Skin: Intact without suspicious lesions or rashes. Lymph: No cervical, axillary lymphadenopathy present. Psych: Mood and affect are normal. Normally interactive                                                                                         Assessment & Plan:  #1 comprehensive physical exam; no acute findings #2 There is potential increased risk of abnormal heart rate &/or cardiac rhythm due to combo of SSRI & statin. She plans to take Lexapro 20 mg 1/2 pill daily #3 no sty visualized Plan: see Orders

## 2012-01-10 NOTE — Patient Instructions (Addendum)
Preventive Health Care: Exercise  30-45  minutes a day, 3-4 days a week. Walking is especially valuable in preventing Osteoporosis. Eat a low-fat diet with lots of fruits and vegetables, up to 7-9 servings per day.  Consume less than 30 grams of sugar per day from foods & drinks with High Fructose Corn Syrup as #1,2,3 or #4 on label. Eye Doctor - have an eye exam @ least annually Health Care Power of Attorney & Living Will place you in charge of your health care  decisions. Verify these are  in place. Blood Pressure Goal  Ideally is an AVERAGE < 135/85. This AVERAGE should be calculated from @ least 5-7 BP readings taken @ different times of day on different days of week. You should not respond to isolated BP readings , but rather the AVERAGE for that week. If you activate My Chart; the results can be released to you as soon as they populate from the lab. If you choose not to use this program; the labs have to be reviewed, copied & mailed   causing a delay in getting the results to you.

## 2012-04-24 ENCOUNTER — Ambulatory Visit (INDEPENDENT_AMBULATORY_CARE_PROVIDER_SITE_OTHER): Payer: Commercial Managed Care - PPO | Admitting: Internal Medicine

## 2012-04-24 ENCOUNTER — Encounter: Payer: Self-pay | Admitting: Internal Medicine

## 2012-04-24 VITALS — BP 132/82 | HR 73 | Temp 97.6°F

## 2012-04-24 DIAGNOSIS — M5431 Sciatica, right side: Secondary | ICD-10-CM

## 2012-04-24 DIAGNOSIS — M5430 Sciatica, unspecified side: Secondary | ICD-10-CM

## 2012-04-24 MED ORDER — METHYLPREDNISOLONE ACETATE 80 MG/ML IJ SUSP
80.0000 mg | Freq: Once | INTRAMUSCULAR | Status: AC
  Administered 2012-04-24: 80 mg via INTRAMUSCULAR

## 2012-04-24 MED ORDER — KETOROLAC TROMETHAMINE 30 MG/ML IJ SOLN
30.0000 mg | Freq: Once | INTRAMUSCULAR | Status: AC
  Administered 2012-04-24: 30 mg via INTRAMUSCULAR

## 2012-04-24 MED ORDER — PREDNISONE 20 MG PO TABS: ORAL_TABLET | ORAL | Status: DC

## 2012-04-24 MED ORDER — OXYCODONE-ACETAMINOPHEN 10-325 MG PO TABS: 1.0000 | ORAL_TABLET | ORAL | Status: DC | PRN

## 2012-04-24 NOTE — Patient Instructions (Signed)
Sciatica with Rehab The sciatic nerve runs from the back down the leg and is responsible for sensation and control of the muscles in the back (posterior) side of the thigh, lower leg, and foot. Sciatica is a condition that is characterized by inflammation of this nerve.  SYMPTOMS   Signs of nerve damage, including numbness and/or weakness along the posterior side of the lower extremity.  Pain in the back of the thigh that may also travel down the leg.  Pain that worsens when sitting for long periods of time.  Occasionally, pain in the back or buttock. CAUSES  Inflammation of the sciatic nerve is the cause of sciatica. The inflammation is due to something irritating the nerve. Common sources of irritation include:  Sitting for long periods of time.  Direct trauma to the nerve.  Arthritis of the spine.  Herniated or ruptured disk.  Slipping of the vertebrae (spondylolithesis)  Pressure from soft tissues, such as muscles or ligament-like tissue (fascia). RISK INCREASES WITH:  Sports that place pressure or stress on the spine (football or weightlifting).  Poor strength and flexibility.  Failure to warm-up properly before activity.  Family history of low back pain or disk disorders.  Previous back injury or surgery.  Poor body mechanics, especially when lifting, or poor posture. PREVENTION   Warm up and stretch properly before activity.  Maintain physical fitness:  Strength, flexibility, and endurance.  Cardiovascular fitness.  Learn and use proper technique, especially with posture and lifting. When possible, have coach correct improper technique.  Avoid activities that place stress on the spine. PROGNOSIS If treated properly, then sciatica usually resolves within 6 weeks. However, occasionally surgery is necessary.  RELATED COMPLICATIONS   Permanent nerve damage, including pain, numbness, tingle, or weakness.  Chronic back pain.  Risks of surgery: infection,  bleeding, nerve damage, or damage to surrounding tissues. TREATMENT Treatment initially involves resting from any activities that aggravate your symptoms. The use of ice and medication may help reduce pain and inflammation. The use of strengthening and stretching exercises may help reduce pain with activity. These exercises may be performed at home or with referral to a therapist. A therapist may recommend further treatments, such as transcutaneous electronic nerve stimulation (TENS) or ultrasound. Your caregiver may recommend corticosteroid injections to help reduce inflammation of the sciatic nerve. If symptoms persist despite non-surgical (conservative) treatment, then surgery may be recommended. MEDICATION  If pain medication is necessary, then nonsteroidal anti-inflammatory medications, such as aspirin and ibuprofen, or other minor pain relievers, such as acetaminophen, are often recommended.  Do not take pain medication for 7 days before surgery.  Prescription pain relievers may be given if deemed necessary by your caregiver. Use only as directed and only as much as you need.  Ointments applied to the skin may be helpful.  Corticosteroid injections may be given by your caregiver. These injections should be reserved for the most serious cases, because they may only be given a certain number of times. HEAT AND COLD  Cold treatment (icing) relieves pain and reduces inflammation. Cold treatment should be applied for 10 to 15 minutes every 2 to 3 hours for inflammation and pain and immediately after any activity that aggravates your symptoms. Use ice packs or massage the area with a piece of ice (ice massage).  Heat treatment may be used prior to performing the stretching and strengthening activities prescribed by your caregiver, physical therapist, or athletic trainer. Use a heat pack or soak the injury in warm water. SEEK   MEDICAL CARE IF:  Treatment seems to offer no benefit, or the condition  worsens.  Any medications produce adverse side effects. EXERCISES  RANGE OF MOTION (ROM) AND STRETCHING EXERCISES - Sciatica Most people with sciatic will find that their symptoms worsen with either excessive bending forward (flexion) or arching at the low back (extension). The exercises which will help resolve your symptoms will focus on the opposite motion. Your physician, physical therapist or athletic trainer will help you determine which exercises will be most helpful to resolve your low back pain. Do not complete any exercises without first consulting with your clinician. Discontinue any exercises which worsen your symptoms until you speak to your clinician. If you have pain, numbness or tingling which travels down into your buttocks, leg or foot, the goal of the therapy is for these symptoms to move closer to your back and eventually resolve. Occasionally, these leg symptoms will get better, but your low back pain may worsen; this is typically an indication of progress in your rehabilitation. Be certain to be very alert to any changes in your symptoms and the activities in which you participated in the 24 hours prior to the change. Sharing this information with your clinician will allow him/her to most efficiently treat your condition. These exercises may help you when beginning to rehabilitate your injury. Your symptoms may resolve with or without further involvement from your physician, physical therapist or athletic trainer. While completing these exercises, remember:   Restoring tissue flexibility helps normal motion to return to the joints. This allows healthier, less painful movement and activity.  An effective stretch should be held for at least 30 seconds.  A stretch should never be painful. You should only feel a gentle lengthening or release in the stretched tissue. FLEXION RANGE OF MOTION AND STRETCHING EXERCISES: STRETCH  Flexion, Single Knee to Chest   Lie on a firm bed or floor  with both legs extended in front of you.  Keeping one leg in contact with the floor, bring your opposite knee to your chest. Hold your leg in place by either grabbing behind your thigh or at your knee.  Pull until you feel a gentle stretch in your low back. Hold __________ seconds.  Slowly release your grasp and repeat the exercise with the opposite side. Repeat __________ times. Complete this exercise __________ times per day.  STRETCH  Flexion, Double Knee to Chest  Lie on a firm bed or floor with both legs extended in front of you.  Keeping one leg in contact with the floor, bring your opposite knee to your chest.  Tense your stomach muscles to support your back and then lift your other knee to your chest. Hold your legs in place by either grabbing behind your thighs or at your knees.  Pull both knees toward your chest until you feel a gentle stretch in your low back. Hold __________ seconds.  Tense your stomach muscles and slowly return one leg at a time to the floor. Repeat __________ times. Complete this exercise __________ times per day.  STRETCH  Low Trunk Rotation   Lie on a firm bed or floor. Keeping your legs in front of you, bend your knees so they are both pointed toward the ceiling and your feet are flat on the floor.  Extend your arms out to the side. This will stabilize your upper body by keeping your shoulders in contact with the floor.  Gently and slowly drop both knees together to one side until you   feel a gentle stretch in your low back. Hold for __________ seconds.  Tense your stomach muscles to support your low back as you bring your knees back to the starting position. Repeat the exercise to the other side. Repeat __________ times. Complete this exercise __________ times per day  EXTENSION RANGE OF MOTION AND FLEXIBILITY EXERCISES: STRETCH  Extension, Prone on Elbows  Lie on your stomach on the floor, a bed will be too soft. Place your palms about shoulder  width apart and at the height of your head.  Place your elbows under your shoulders. If this is too painful, stack pillows under your chest.  Allow your body to relax so that your hips drop lower and make contact more completely with the floor.  Hold this position for __________ seconds.  Slowly return to lying flat on the floor. Repeat __________ times. Complete this exercise __________ times per day.  RANGE OF MOTION  Extension, Prone Press Ups  Lie on your stomach on the floor, a bed will be too soft. Place your palms about shoulder width apart and at the height of your head.  Keeping your back as relaxed as possible, slowly straighten your elbows while keeping your hips on the floor. You may adjust the placement of your hands to maximize your comfort. As you gain motion, your hands will come more underneath your shoulders.  Hold this position __________ seconds.  Slowly return to lying flat on the floor. Repeat __________ times. Complete this exercise __________ times per day.  STRENGTHENING EXERCISES - Sciatica  These exercises may help you when beginning to rehabilitate your injury. These exercises should be done near your "sweet spot." This is the neutral, low-back arch, somewhere between fully rounded and fully arched, that is your least painful position. When performed in this safe range of motion, these exercises can be used for people who have either a flexion or extension based injury. These exercises may resolve your symptoms with or without further involvement from your physician, physical therapist or athletic trainer. While completing these exercises, remember:   Muscles can gain both the endurance and the strength needed for everyday activities through controlled exercises.  Complete these exercises as instructed by your physician, physical therapist or athletic trainer. Progress with the resistance and repetition exercises only as your caregiver advises.  You may  experience muscle soreness or fatigue, but the pain or discomfort you are trying to eliminate should never worsen during these exercises. If this pain does worsen, stop and make certain you are following the directions exactly. If the pain is still present after adjustments, discontinue the exercise until you can discuss the trouble with your clinician. STRENGTHENING Deep Abdominals, Pelvic Tilt   Lie on a firm bed or floor. Keeping your legs in front of you, bend your knees so they are both pointed toward the ceiling and your feet are flat on the floor.  Tense your lower abdominal muscles to press your low back into the floor. This motion will rotate your pelvis so that your tail bone is scooping upwards rather than pointing at your feet or into the floor.  With a gentle tension and even breathing, hold this position for __________ seconds. Repeat __________ times. Complete this exercise __________ times per day.  STRENGTHENING  Abdominals, Crunches   Lie on a firm bed or floor. Keeping your legs in front of you, bend your knees so they are both pointed toward the ceiling and your feet are flat on the floor. Cross   your arms over your chest.  Slightly tip your chin down without bending your neck.  Tense your abdominals and slowly lift your trunk high enough to just clear your shoulder blades. Lifting higher can put excessive stress on the low back and does not further strengthen your abdominal muscles.  Control your return to the starting position. Repeat __________ times. Complete this exercise __________ times per day.  STRENGTHENING  Quadruped, Opposite UE/LE Lift  Assume a hands and knees position on a firm surface. Keep your hands under your shoulders and your knees under your hips. You may place padding under your knees for comfort.  Find your neutral spine and gently tense your abdominal muscles so that you can maintain this position. Your shoulders and hips should form a rectangle that  is parallel with the floor and is not twisted.  Keeping your trunk steady, lift your right hand no higher than your shoulder and then your left leg no higher than your hip. Make sure you are not holding your breath. Hold this position __________ seconds.  Continuing to keep your abdominal muscles tense and your back steady, slowly return to your starting position. Repeat with the opposite arm and leg. Repeat __________ times. Complete this exercise __________ times per day.  STRENGTHENING  Abdominals and Quadriceps, Straight Leg Raise   Lie on a firm bed or floor with both legs extended in front of you.  Keeping one leg in contact with the floor, bend the other knee so that your foot can rest flat on the floor.  Find your neutral spine, and tense your abdominal muscles to maintain your spinal position throughout the exercise.  Slowly lift your straight leg off the floor about 6 inches for a count of 15, making sure to not hold your breath.  Still keeping your neutral spine, slowly lower your leg all the way to the floor. Repeat this exercise with each leg __________ times. Complete this exercise __________ times per day. POSTURE AND BODY MECHANICS CONSIDERATIONS - Sciatica Keeping correct posture when sitting, standing or completing your activities will reduce the stress put on different body tissues, allowing injured tissues a chance to heal and limiting painful experiences. The following are general guidelines for improved posture. Your physician or physical therapist will provide you with any instructions specific to your needs. While reading these guidelines, remember:  The exercises prescribed by your provider will help you have the flexibility and strength to maintain correct postures.  The correct posture provides the optimal environment for your joints to work. All of your joints have less wear and tear when properly supported by a spine with good posture. This means you will  experience a healthier, less painful body.  Correct posture must be practiced with all of your activities, especially prolonged sitting and standing. Correct posture is as important when doing repetitive low-stress activities (typing) as it is when doing a single heavy-load activity (lifting). RESTING POSITIONS Consider which positions are most painful for you when choosing a resting position. If you have pain with flexion-based activities (sitting, bending, stooping, squatting), choose a position that allows you to rest in a less flexed posture. You would want to avoid curling into a fetal position on your side. If your pain worsens with extension-based activities (prolonged standing, working overhead), avoid resting in an extended position such as sleeping on your stomach. Most people will find more comfort when they rest with their spine in a more neutral position, neither too rounded nor too arched. Lying   on a non-sagging bed on your side with a pillow between your knees, or on your back with a pillow under your knees will often provide some relief. Keep in mind, being in any one position for a prolonged period of time, no matter how correct your posture, can still lead to stiffness. PROPER SITTING POSTURE In order to minimize stress and discomfort on your spine, you must sit with correct posture Sitting with good posture should be effortless for a healthy body. Returning to good posture is a gradual process. Many people can work toward this most comfortably by using various supports until they have the flexibility and strength to maintain this posture on their own. When sitting with proper posture, your ears will fall over your shoulders and your shoulders will fall over your hips. You should use the back of the chair to support your upper back. Your low back will be in a neutral position, just slightly arched. You may place a small pillow or folded towel at the base of your low back for support.  When  working at a desk, create an environment that supports good, upright posture. Without extra support, muscles fatigue and lead to excessive strain on joints and other tissues. Keep these recommendations in mind: CHAIR:   A chair should be able to slide under your desk when your back makes contact with the back of the chair. This allows you to work closely.  The chair's height should allow your eyes to be level with the upper part of your monitor and your hands to be slightly lower than your elbows. BODY POSITION  Your feet should make contact with the floor. If this is not possible, use a foot rest.  Keep your ears over your shoulders. This will reduce stress on your neck and low back. INCORRECT SITTING POSTURES   If you are feeling tired and unable to assume a healthy sitting posture, do not slouch or slump. This puts excessive strain on your back tissues, causing more damage and pain. Healthier options include:  Using more support, like a lumbar pillow.  Switching tasks to something that requires you to be upright or walking.  Talking a brief walk.  Lying down to rest in a neutral-spine position. PROLONGED STANDING WHILE SLIGHTLY LEANING FORWARD  When completing a task that requires you to lean forward while standing in one place for a long time, place either foot up on a stationary 2-4 inch high object to help maintain the best posture. When both feet are on the ground, the low back tends to lose its slight inward curve. If this curve flattens (or becomes too large), then the back and your other joints will experience too much stress, fatigue more quickly and can cause pain.  CORRECT STANDING POSTURES Proper standing posture should be assumed with all daily activities, even if they only take a few moments, like when brushing your teeth. As in sitting, your ears should fall over your shoulders and your shoulders should fall over your hips. You should keep a slight tension in your abdominal  muscles to brace your spine. Your tailbone should point down to the ground, not behind your body, resulting in an over-extended swayback posture.  INCORRECT STANDING POSTURES  Common incorrect standing postures include a forward head, locked knees and/or an excessive swayback. WALKING Walk with an upright posture. Your ears, shoulders and hips should all line-up. PROLONGED ACTIVITY IN A FLEXED POSITION When completing a task that requires you to bend forward at your   waist or lean over a low surface, try to find a way to stabilize 3 of 4 of your limbs. You can place a hand or elbow on your thigh or rest a knee on the surface you are reaching across. This will provide you more stability so that your muscles do not fatigue as quickly. By keeping your knees relaxed, or slightly bent, you will also reduce stress across your low back. CORRECT LIFTING TECHNIQUES DO :   Assume a wide stance. This will provide you more stability and the opportunity to get as close as possible to the object which you are lifting.  Tense your abdominals to brace your spine; then bend at the knees and hips. Keeping your back locked in a neutral-spine position, lift using your leg muscles. Lift with your legs, keeping your back straight.  Test the weight of unknown objects before attempting to lift them.  Try to keep your elbows locked down at your sides in order get the best strength from your shoulders when carrying an object.  Always ask for help when lifting heavy or awkward objects. INCORRECT LIFTING TECHNIQUES DO NOT:   Lock your knees when lifting, even if it is a small object.  Bend and twist. Pivot at your feet or move your feet when needing to change directions.  Assume that you cannot safely pick up a paperclip without proper posture. Document Released: 03/15/2005 Document Revised: 06/07/2011 Document Reviewed: 06/27/2008 [employer] Patient Information 2013 [employer]  

## 2012-04-24 NOTE — Progress Notes (Signed)
Subjective:    Patient ID: Jasmine Bray, female    DOB: 05/18/1950, 62 y.o.   MRN: [id number]  HPI  Pt presents to the clinic today with c/o right leg pain that shoots down her leg. This started yesterday. She was doing some heavy lifting this past weekend but does not recall an injury to her back. The pain starts in her right buttock and extends down her right leg. She states the pain is 8/10 and feels like a hot poker. She has never had pain like this before and has not taken anything for the pain.  Review of Systems      Past Medical History  Diagnosis Date  . Hyperlipidemia   . Hypertension   . GERD (gastroesophageal reflux disease)   . Allergy     RHINITIS  . Anxiety   . Depression   . Fibroids     Current Outpatient Prescriptions  Medication Sig Dispense Refill  . ALPRAZolam (XANAX) 1 MG tablet Take 1 mg by mouth as needed.      Marland Kitchen aspirin EC 81 MG tablet Take 81 mg by mouth daily.      Marland Kitchen erythromycin ophthalmic ointment Place into the right eye every 6 (six) hours. Fill if sty present  3.5 g  0  . escitalopram (LEXAPRO) 20 MG tablet Take 20 mg by mouth daily.        Marland Kitchen estradiol (ESTRACE) 0.1 MG/GM vaginal cream Place 2 g vaginally. 1 GRAM 3x A WEEK       . hydrochlorothiazide (MICROZIDE) 12.5 MG capsule Take 1 capsule (12.5 mg total) by mouth daily.  90 capsule  3  . Omega-3 Fatty Acids (FISH OIL) 1000 MG CAPS Take by mouth daily.      . simvastatin (ZOCOR) 40 MG tablet Take 1 tablet (40 mg total) by mouth at bedtime.  90 tablet  2  . valACYclovir (VALTREX) 1000 MG tablet Take 1,000 mg by mouth. 1/2 by mouth once daily      . vitamin E 400 UNIT capsule Take 400 Units by mouth daily.      Marland Kitchen oxyCODONE-acetaminophen (PERCOCET) 10-325 MG per tablet Take 1 tablet by mouth every 4 (four) hours as needed for pain.  20 tablet  0  . predniSONE (DELTASONE) 20 MG tablet Take 3 tablets on days 1-3, take 2 tablets on days 4-6, take 1 tablet on days 7-9, take 1/2 tablet on days 10-12   21 tablet  0    No Known Allergies  Family History  Problem Relation Age of Onset  . Arthritis Mother   . Stroke Mother 51  . Alcohol abuse Father   . Hyperlipidemia Sister   . Hypertension Sister   . Breast cancer Paternal Aunt   . Diabetes Maternal Grandmother   . Heart disease Paternal Grandmother     CAD; no MI  . Stroke Paternal Grandmother     in 72s  . Heart disease Paternal Grandfather     CAD; no MI    History   Social History  . Marital Status: Divorced    Spouse Name: N/A    Number of Children: N/A  . Years of Education: N/A   Occupational History  . SALES    Social History Main Topics  . Smoking status: Never Smoker   . Smokeless tobacco: Not on file  . Alcohol Use: 0.0 oz/week     Comment:  rarely  . Drug Use: No  . Sexually Active: Not on file  Other Topics Concern  . Not on file   Social History Narrative   GETS REG EXERCISE     Constitutional: Denies fever, malaise, fatigue, headache or abrupt weight changes.  Musculoskeletal: Pt reports pain in her right leg. Denies decrease in range of motion, muscle pain or joint pain and swelling.  Neurological: Denies dizziness, difficulty with memory, difficulty with speech or problems with balance and coordination.   No other specific complaints in a complete review of systems (except as listed in HPI above).  Objective:   Physical Exam   BP 132/82  Pulse 73  Temp 97.6 F (36.4 C) (Oral)  SpO2 95% Wt Readings from Last 3 Encounters:  01/10/12 167 lb 12.8 oz (76.114 kg)  06/29/11 155 lb 6.4 oz (70.489 kg)  01/05/11 167 lb 6.4 oz (75.932 kg)    General: Appears her stated age, well developed, well nourished in NAD. Cardiovascular: Normal rate and rhythm. S1,S2 noted.  No murmur, rubs or gallops noted. No JVD or BLE edema. No carotid bruits noted. Pulmonary/Chest: Normal effort and positive vesicular breath sounds. No respiratory distress. No wheezes, rales or ronchi noted.    Musculoskeletal: Normal range of motion. No signs of joint swelling. No difficulty with gait.  Neurological: Alert and oriented. Cranial nerves II-XII intact. Coordination normal. +DTRs bilaterally.        Assessment & Plan:   Sciatica, right leg, new onset with additional workup required:  Do stretching exercises as shown on handout Toradol and Depo IM today eRx for pred taper  RTC as needed or if symptoms persist

## 2012-04-26 ENCOUNTER — Telehealth: Payer: Self-pay | Admitting: *Deleted

## 2012-04-26 NOTE — Telephone Encounter (Signed)
Yes, would be fine; many other pt's do this as well

## 2012-04-26 NOTE — Telephone Encounter (Signed)
Called the patient left message to call back 

## 2012-04-26 NOTE — Telephone Encounter (Signed)
Pt was seen in office on Monday for sciatic nerve pain by Nicki Reaper. Her pain is a little better but not much. Pt wants to know if she can take her Gabapentin prescribed for her bone spurs along with pain medication prescribed (Percocet) and Prednisone. Please advise in RB's absence.

## 2012-04-27 NOTE — Telephone Encounter (Signed)
Left message for pt to callback office.  

## 2012-04-27 NOTE — Telephone Encounter (Signed)
Called left message to call back 

## 2012-04-27 NOTE — Telephone Encounter (Signed)
Patient informed. 

## 2012-04-28 ENCOUNTER — Other Ambulatory Visit: Payer: Self-pay | Admitting: Internal Medicine

## 2012-04-28 ENCOUNTER — Telehealth: Payer: Self-pay | Admitting: Internal Medicine

## 2012-04-28 DIAGNOSIS — M5431 Sciatica, right side: Secondary | ICD-10-CM

## 2012-04-28 MED ORDER — OXYCODONE-ACETAMINOPHEN 10-325 MG PO TABS: 1.0000 | ORAL_TABLET | ORAL | Status: DC | PRN

## 2012-04-28 NOTE — Telephone Encounter (Signed)
Ash, Refilled this time. If continues to have pain should be revaluated by orthopedics or nuerosurgery. Rene Kocher

## 2012-04-28 NOTE — Telephone Encounter (Signed)
Pt informed of NP's advisement. Rx placed upfront in cabinet ready for pickup.

## 2012-04-28 NOTE — Telephone Encounter (Signed)
Patient is requesting a refill on her percocet to get her through the weekend since that is the only way she gets any relief

## 2012-06-09 ENCOUNTER — Other Ambulatory Visit: Payer: Self-pay

## 2012-06-09 DIAGNOSIS — Z1231 Encounter for screening mammogram for malignant neoplasm of breast: Secondary | ICD-10-CM

## 2012-06-27 ENCOUNTER — Ambulatory Visit
Admission: RE | Admit: 2012-06-27 | Discharge: 2012-06-27 | Disposition: A | Discharge status: Home / Self Care | Payer: Commercial Managed Care - PPO | Source: Ambulatory Visit

## 2012-06-27 DIAGNOSIS — Z1231 Encounter for screening mammogram for malignant neoplasm of breast: Secondary | ICD-10-CM

## 2012-07-27 ENCOUNTER — Other Ambulatory Visit: Payer: Self-pay | Admitting: Internal Medicine

## 2012-08-31 ENCOUNTER — Other Ambulatory Visit: Payer: Self-pay | Admitting: Dermatology

## 2012-12-01 ENCOUNTER — Other Ambulatory Visit: Payer: Self-pay | Admitting: Internal Medicine

## 2013-01-09 ENCOUNTER — Telehealth: Payer: Self-pay

## 2013-01-09 NOTE — Telephone Encounter (Signed)
LM for C/B. 

## 2013-01-09 NOTE — Telephone Encounter (Addendum)
Medication and allergies: done (may need adjustment on milligrams for supplements)  [facility] 90 day supply [facility] [address]   Immunizations due: UTD WE Zostavax  A/P:  Last:  PAP:    UTD 06/2010 WNL MMG: UTD 05/2012 WNL Dexa: na  CCS: due patient states will schedule DM: na HTN: due Lipids: due  Patient states that her BP has been in the 150/70 range.

## 2013-01-10 ENCOUNTER — Ambulatory Visit (INDEPENDENT_AMBULATORY_CARE_PROVIDER_SITE_OTHER): Payer: Commercial Managed Care - PPO | Admitting: Internal Medicine

## 2013-01-10 ENCOUNTER — Encounter: Payer: Self-pay | Admitting: Internal Medicine

## 2013-01-10 VITALS — BP 144/80 | HR 72 | Temp 97.8°F | Ht 67.25 in | Wt 177.4 lb

## 2013-01-10 DIAGNOSIS — E785 Hyperlipidemia, unspecified: Secondary | ICD-10-CM

## 2013-01-10 DIAGNOSIS — Z0000 Encounter for general adult medical examination without abnormal findings: Secondary | ICD-10-CM

## 2013-01-10 DIAGNOSIS — K219 Gastro-esophageal reflux disease without esophagitis: Secondary | ICD-10-CM

## 2013-01-10 LAB — CBC WITH DIFFERENTIAL/PLATELET
Basophils Absolute: 0.1 10*3/uL (ref 0.0–0.1)
Basophils Relative: 1.1 % (ref 0.0–3.0)
Eosinophils Absolute: 0.2 10*3/uL (ref 0.0–0.7)
Hemoglobin: 13.6 g/dL (ref 12.0–15.0)
MCHC: 34.3 g/dL (ref 30.0–36.0)
MCV: 94.9 fl (ref 78.0–100.0)
Monocytes Absolute: 0.6 10*3/uL (ref 0.1–1.0)
Neutro Abs: 3.9 10*3/uL (ref 1.4–7.7)
Neutrophils Relative %: 61.1 % (ref 43.0–77.0)
RBC: 4.17 Mil/uL (ref 3.87–5.11)
RDW: 13.3 % (ref 11.5–14.6)

## 2013-01-10 LAB — BASIC METABOLIC PANEL
CO2: 25 mEq/L (ref 19–32)
Calcium: 8.9 mg/dL (ref 8.4–10.5)
Chloride: 102 mEq/L (ref 96–112)
Creatinine, Ser: 0.8 mg/dL (ref 0.4–1.2)
Glucose, Bld: 95 mg/dL (ref 70–99)

## 2013-01-10 LAB — HEPATIC FUNCTION PANEL
Albumin: 4.1 g/dL (ref 3.5–5.2)
Total Protein: 7.2 g/dL (ref 6.0–8.3)

## 2013-01-10 LAB — LIPID PANEL
Cholesterol: 204 mg/dL — ABNORMAL HIGH (ref 0–200)
HDL: 58 mg/dL (ref >39.00)
Triglycerides: 161 mg/dL — ABNORMAL HIGH (ref 0.0–149.0)
VLDL: 32.2 mg/dL (ref 0.0–40.0)

## 2013-01-10 LAB — LDL CHOLESTEROL, DIRECT: Direct LDL: 131 mg/dL

## 2013-01-10 MED ORDER — SIMVASTATIN 40 MG PO TABS: ORAL_TABLET | ORAL | Status: DC

## 2013-01-10 NOTE — Patient Instructions (Addendum)
Reflux of gastric acid may be asymptomatic as this may occur mainly during sleep.The triggers for reflux  include stress; the "aspirin family" ; alcohol; peppermint; and caffeine (coffee, tea, cola, and chocolate). The aspirin family would include aspirin and the nonsteroidal agents such as ibuprofen &  Naproxen. Tylenol would not cause reflux. If having symptoms ; food & drink should be avoided for @ least 2 hours before going to bed.  

## 2013-01-10 NOTE — Progress Notes (Signed)
  Subjective:    Patient ID: Jasmine Bray, female    DOB: February 26, 1951, 62 y.o.   MRN: [id number]  HPI  She is here for a physical;acute issues denied.     Review of Systems She is on a heart healthy diet; she exercises as elliptical , stair master 30-90 minutes or horse back riding averaging 180 min; total of  4 times per week without symptoms. Specifically she denies chest pain, palpitations, dyspnea, or claudication.  Family history is negative for premature coronary disease . Advanced cholesterol testing reveals her LDL goal is less than 100. There is medication compliance with the statin. Significant abdominal symptoms, memory deficit, or myalgias denied @ present.  Over the prior 6 months she did have some dysphagia which resolved with Prilosec OTC as needed. She has been taking 2 Advil when she awakens and then 2 at bedtime. She also takes 81 mg ASA daily .     Objective:   Physical Exam  Gen.: Healthy and well-nourished in appearance. Alert, appropriate and cooperative throughout exam.Appears younger than stated age  Head: Normocephalic without obvious abnormalities Eyes: No corneal or conjunctival inflammation noted. Pupils equal round reactive to light and accommodation. Minor OS pterygium.Marland Kitchen Extraocular motion intact.  Ears: External  ear exam reveals no significant lesions or deformities. Canals clear .TMs normal. Hearing is grossly normal bilaterally. Nose: External nasal exam reveals no deformity or inflammation. Nasal mucosa are pink and moist. No lesions or exudates noted.   Mouth: Oral mucosa and oropharynx reveal no lesions or exudates. Teeth in good repair. Neck: No deformities, masses, or tenderness noted. Range of motion & Thyroid normal. Lungs: Normal respiratory effort; chest expands symmetrically. Lungs are clear to auscultation without rales, wheezes, or increased work of breathing. Heart: Normal rate and rhythm. Normal S1 and S2. No gallop, click, or rub. S4 w/o  murmur. Abdomen: Bowel sounds normal; abdomen soft and nontender. No masses, organomegaly or hernias noted. Genitalia: As per Gyn                                  Musculoskeletal/extremities: No deformity or scoliosis noted of  the thoracic or lumbar spine.  No clubbing, cyanosis, or edema noted. Range of motion normal .Tone & strength  Normal. Joints  reveal mixed PIP & DIP changes. Nail health good. Able to lie down & sit up w/o help. Negative SLR bilaterally Vascular: Carotid, radial artery, dorsalis pedis and  posterior tibial pulses are full and equal. No bruits present. Neurologic: Alert and oriented x3. Deep tendon reflexes symmetrical and normal.        Skin: Intact without suspicious lesions or rashes. Lymph: No cervical, axillary lymphadenopathy present. Psych: Mood and affect are normal. Normally interactive                                                                                        Assessment & Plan:  #1 comprehensive physical exam; no acute findings #2 dysphagia, resolved with Prilosec OTC  Plan: see Orders  & Recommendations

## 2013-03-20 ENCOUNTER — Encounter: Payer: Self-pay | Admitting: Internal Medicine

## 2013-04-23 ENCOUNTER — Telehealth: Payer: Self-pay | Admitting: *Deleted

## 2013-04-23 NOTE — Telephone Encounter (Signed)
See one of his partners for continuity of care

## 2013-04-23 NOTE — Telephone Encounter (Signed)
Patient called and stated that she received a letter stating that it was time to have her colonoscopy done again but the doctor she had seen before is retired. Patient does not know were to go now. Please advise

## 2013-04-23 NOTE — Telephone Encounter (Signed)
Please advise 

## 2013-04-24 ENCOUNTER — Ambulatory Visit (INDEPENDENT_AMBULATORY_CARE_PROVIDER_SITE_OTHER): Payer: Commercial Managed Care - PPO | Admitting: Family Medicine

## 2013-04-24 ENCOUNTER — Encounter: Payer: Self-pay | Admitting: Family Medicine

## 2013-04-24 VITALS — BP 130/82 | HR 71 | Temp 97.8°F | Resp 16 | Wt 174.2 lb

## 2013-04-24 DIAGNOSIS — M25519 Pain in unspecified shoulder: Secondary | ICD-10-CM

## 2013-04-24 DIAGNOSIS — M25511 Pain in right shoulder: Secondary | ICD-10-CM | POA: Insufficient documentation

## 2013-04-24 DIAGNOSIS — M5412 Radiculopathy, cervical region: Secondary | ICD-10-CM

## 2013-04-24 DIAGNOSIS — Z0184 Encounter for antibody response examination: Secondary | ICD-10-CM

## 2013-04-24 MED ORDER — NAPROXEN 500 MG PO TABS: 500.0000 mg | ORAL_TABLET | Freq: Two times a day (BID) | ORAL | Status: AC

## 2013-04-24 MED ORDER — TRAMADOL HCL 50 MG PO TABS: 50.0000 mg | ORAL_TABLET | Freq: Four times a day (QID) | ORAL | Status: AC | PRN

## 2013-04-24 NOTE — Patient Instructions (Signed)
Follow up as needed Start the Naproxen twice daily- take w/ food Use the Tramadol for pain at night We'll call you with your Ortho appt Call with any questions or concerns Hang in there!!!

## 2013-04-24 NOTE — Progress Notes (Signed)
Pre visit review using our clinic review tool, if applicable. No additional management support is needed unless otherwise documented below in the visit note. 

## 2013-04-24 NOTE — Assessment & Plan Note (Signed)
New.  Suspect rotator cuff pathology.  Start scheduled NSAIDs and tramadol for pain.  Refer to ortho.

## 2013-04-24 NOTE — Progress Notes (Signed)
   Subjective:    Patient ID: Jasmine Bray, female    DOB: May 29, 1950, 63 y.o.   MRN: [id number]  HPI Shoulder pain- R sided, occurred 1 month ago after falling in the barn.  Last night was 1st time she was able to sleep w/o pain.  Having pain at bra strap and a pulling sensation in biceps.  Unable to lift arm to 90 degrees and no overhead motion.  Unable to lift purse w/o pain.  Able to use arm in the 0-60 degree range   Review of Systems For ROS see HPI     Objective:   Physical Exam  Vitals reviewed. Constitutional: She appears well-developed and well-nourished. No distress.  Cardiovascular: Intact distal pulses.   Musculoskeletal: She exhibits no edema.  R shoulder- + impingement signs, limited forward flexion above 90 degrees, painful abduction above 60 degrees.  Good internal/external rotation  Neurological: She has normal reflexes.          Assessment & Plan:

## 2013-04-25 LAB — VARICELLA ZOSTER ANTIBODY, IGG: VARICELLA IGG: 446 {index} — AB (ref <135.00)

## 2013-04-25 NOTE — Telephone Encounter (Signed)
Called and left message for patient to return call. JG//CMA

## 2013-05-11 HISTORY — PX: OTHER SURGICAL HISTORY: SHX169

## 2013-05-16 ENCOUNTER — Telehealth: Payer: Self-pay | Admitting: General Practice

## 2013-05-16 NOTE — Telephone Encounter (Signed)
Pt called the triage line stating that she has a severe cough and fatigue. Pt wanted to know what ITC meds she could take pt advised delsym for cough and mucinex for drainage due to hypertension on problem list pt was told not to take any sudafed or nyquil. Pt was made an appt for Thursday @ 11:30am with [place].

## 2013-05-17 ENCOUNTER — Ambulatory Visit (INDEPENDENT_AMBULATORY_CARE_PROVIDER_SITE_OTHER): Payer: Commercial Managed Care - PPO | Admitting: Family Medicine

## 2013-05-17 ENCOUNTER — Encounter: Payer: Self-pay | Admitting: Family Medicine

## 2013-05-17 VITALS — BP 98/60 | HR 83 | Temp 97.9°F

## 2013-05-17 DIAGNOSIS — J209 Acute bronchitis, unspecified: Secondary | ICD-10-CM

## 2013-05-17 DIAGNOSIS — J019 Acute sinusitis, unspecified: Secondary | ICD-10-CM

## 2013-05-17 MED ORDER — TRIAMCINOLONE ACETONIDE 55 MCG/ACT NA AERO: 2.0000 | INHALATION_SPRAY | Freq: Every day | NASAL | Status: DC

## 2013-05-17 MED ORDER — AMOXICILLIN-POT CLAVULANATE 875-125 MG PO TABS: 1.0000 | ORAL_TABLET | Freq: Two times a day (BID) | ORAL | Status: DC

## 2013-05-17 MED ORDER — GUAIFENESIN-CODEINE 100-10 MG/5ML PO SYRP: ORAL_SOLUTION | ORAL | Status: DC

## 2013-05-17 NOTE — Progress Notes (Signed)
Pre visit review using our clinic review tool, if applicable. No additional management support is needed unless otherwise documented below in the visit note. 

## 2013-05-17 NOTE — Patient Instructions (Signed)

## 2013-05-17 NOTE — Progress Notes (Signed)
  Subjective:     Jasmine Bray is a 63 y.o. female who presents for evaluation of symptoms of a URI. Symptoms include achiness, congestion, facial pain, nasal congestion, productive cough with  green colored sputum, shortness of breath, sinus pressure and sore throat. Onset of symptoms was 5 days ago, and has been gradually worsening since that time. Treatment to date: none.  The following portions of the patient's history were reviewed and updated as appropriate: allergies, current medications, past family history, past medical history, past social history, past surgical history and problem list.  Review of Systems Pertinent items are noted in HPI.   Objective:    BP 98/60  Pulse 83  Temp(Src) 97.9 F (36.6 C) (Oral)  SpO2 95% General appearance: alert, cooperative, appears stated age and no distress Ears: normal TM's and external ear canals both ears Nose: green discharge, moderate congestion, turbinates red, swollen, sinus tenderness bilateral Throat: lips, mucosa, and tongue normal; teeth and gums normal Neck: mild anterior cervical adenopathy, supple, symmetrical, trachea midline and thyroid not enlarged, symmetric, no tenderness/mass/nodules Lungs: rhonchi bilaterally and wheezes bilaterally Heart: S1, S2 normal   Assessment:    bronchitis and sinusitis   Plan:    Suggested symptomatic OTC remedies. Nasal saline spray for congestion. Augmentin per orders. Nasal steroids per orders. Follow up as needed. cough meds per meds and orders

## 2013-05-22 ENCOUNTER — Encounter: Payer: Self-pay | Admitting: Internal Medicine

## 2013-05-23 ENCOUNTER — Other Ambulatory Visit: Payer: Self-pay | Admitting: Family Medicine

## 2013-08-01 ENCOUNTER — Encounter: Payer: Self-pay | Admitting: Internal Medicine

## 2013-09-11 ENCOUNTER — Ambulatory Visit (AMBULATORY_SURGERY_CENTER): Payer: Self-pay

## 2013-09-11 VITALS — Ht 67.5 in | Wt 170.0 lb

## 2013-09-11 DIAGNOSIS — Z1211 Encounter for screening for malignant neoplasm of colon: Secondary | ICD-10-CM

## 2013-09-11 MED ORDER — SUPREP BOWEL PREP KIT 17.5-3.13-1.6 GM/177ML PO SOLN: 1.0000 | Freq: Once | ORAL | Status: DC

## 2013-09-11 NOTE — Progress Notes (Signed)
No allergies to eggs or soy No home oxygen No diet/weight loss meds No past problems with anesthesia  Has email  Emmi instructions given for colonoscopy 

## 2013-09-25 ENCOUNTER — Encounter: Payer: Self-pay | Admitting: Internal Medicine

## 2013-09-25 ENCOUNTER — Ambulatory Visit (AMBULATORY_SURGERY_CENTER): Payer: Commercial Managed Care - PPO | Admitting: Internal Medicine

## 2013-09-25 VITALS — BP 167/74 | HR 48 | Temp 96.5°F | Resp 13 | Ht 67.5 in | Wt 170.0 lb

## 2013-09-25 DIAGNOSIS — Z1211 Encounter for screening for malignant neoplasm of colon: Secondary | ICD-10-CM

## 2013-09-25 MED ORDER — SODIUM CHLORIDE 0.9 % IV SOLN: 500.0000 mL | INTRAVENOUS | Status: DC

## 2013-09-25 NOTE — Op Note (Signed)
[place]  [address] [place], [address]   COLONOSCOPY PROCEDURE REPORT  PATIENT: Jasmine Bray, Jasmine Bray  MR#: [id number] BIRTHDATE: [birth year] , 89  yrs. old GENDER: Female ENDOSCOPIST: Gatha Mayer, MD, [facility] PROCEDURE DATE:  09/25/2013 PROCEDURE:   Colonoscopy, screening First Screening Colonoscopy - Avg.  risk and is 50 yrs.  old or older - No.  Prior Negative Screening - Now for repeat screening. 10 or more years since last screening  History of Adenoma - Now for follow-up colonoscopy & has been > or = to 3 yrs.  N/A  Polyps Removed Today? No.  Recommend repeat exam, <10 yrs? No. ASA CLASS:   Class II INDICATIONS:average risk screening and Last colonoscopy performed 10 years ago. MEDICATIONS: propofol (Diprivan) 250mg  IV, MAC sedation, administered by CRNA, and These medications were titrated to patient response per physician's verbal order  DESCRIPTION OF PROCEDURE:   After the risks benefits and alternatives of the procedure were thoroughly explained, informed consent was obtained.  A digital rectal exam revealed no abnormalities of the rectum.   The LB [id number]  endoscope was introduced through the anus and advanced to the cecum, which was identified by both the appendix and ileocecal valve. No adverse events experienced.   The quality of the prep was excellent using Suprep  The instrument was then slowly withdrawn as the colon was fully examined.      COLON FINDINGS: A normal appearing cecum, ileocecal valve, and appendiceal orifice were identified.  The ascending, hepatic flexure, transverse, splenic flexure, descending, sigmoid colon and rectum appeared unremarkable.  No polyps or cancers were seen.   A right colon retroflexion was performed.  Retroflexed views revealed no abnormalities. The time to cecum=3 minutes 29 seconds. Withdrawal time=6 minutes 55 seconds.  The scope was withdrawn and the procedure completed. COMPLICATIONS:  There were no complications.  ENDOSCOPIC IMPRESSION: Normal colonoscopy  RECOMMENDATIONS: Repeat colonoscopy 10 years - 2025   eSigned:  Gatha Mayer, MD, [facility] 09/25/2013 12:31 PM   cc: The Patient

## 2013-09-25 NOTE — Progress Notes (Signed)
A/ox3 pleased with MAC, report to Jane RN 

## 2013-09-25 NOTE — Patient Instructions (Addendum)
The colonoscopy was normal!  Next routine colonoscopy in 10 years - 2025  I appreciate the opportunity to care for you. Gatha Mayer, MD, FACG   YOU HAD AN ENDOSCOPIC PROCEDURE TODAY AT [facility]: Refer to the procedure report that was given to you for any specific questions about what was found during the examination.  If the procedure report does not answer your questions, please call your gastroenterologist to clarify.  If you requested that your care partner not be given the details of your procedure findings, then the procedure report has been included in a sealed envelope for you to review at your convenience later.  YOU SHOULD EXPECT: Some feelings of bloating in the abdomen. Passage of more gas than usual.  Walking can help get rid of the air that was put into your GI tract during the procedure and reduce the bloating. If you had a lower endoscopy (such as a colonoscopy or flexible sigmoidoscopy) you may notice spotting of blood in your stool or on the toilet paper. If you underwent a bowel prep for your procedure, then you may not have a normal bowel movement for a few days.  DIET: Your first meal following the procedure should be a light meal and then it is ok to progress to your normal diet.  A half-sandwich or bowl of soup is an example of a good first meal.  Heavy or fried foods are harder to digest and may make you feel nauseous or bloated.  Likewise meals heavy in dairy and vegetables can cause extra gas to form and this can also increase the bloating.  Drink plenty of fluids but you should avoid alcoholic beverages for 24 hours.  ACTIVITY: Your care partner should take you home directly after the procedure.  You should plan to take it easy, moving slowly for the rest of the day.  You can resume normal activity the day after the procedure however you should NOT DRIVE or use heavy machinery for 24 hours (because of the sedation medicines used during the test).     SYMPTOMS TO REPORT IMMEDIATELY: A gastroenterologist can be reached at any hour.  During normal business hours, 8:30 AM to 5:00 PM Monday through Friday, call [phone].  After hours and on weekends, please call the GI answering service at [phone] who will take a message and have the physician on call contact you.   Following lower endoscopy (colonoscopy or flexible sigmoidoscopy):  Excessive amounts of blood in the stool  Significant tenderness or worsening of abdominal pains  Swelling of the abdomen that is new, acute  Fever of 100F or higher  FOLLOW UP: If any biopsies were taken you will be contacted by phone or by letter within the next 1-3 weeks.  Call your gastroenterologist if you have not heard about the biopsies in 3 weeks.  Our staff will call the home number listed on your records the next business day following your procedure to check on you and address any questions or concerns that you may have at that time regarding the information given to you following your procedure. This is a courtesy call and so if there is no answer at the home number and we have not heard from you through the emergency physician on call, we will assume that you have returned to your regular daily activities without incident.  SIGNATURES/CONFIDENTIALITY: You and/or your care partner have signed paperwork which will be entered into your electronic medical record.  These  signatures attest to the fact that that the information above on your After Visit Summary has been reviewed and is understood.  Full responsibility of the confidentiality of this discharge information lies with you and/or your care-partner. 

## 2013-09-26 ENCOUNTER — Telehealth: Payer: Self-pay | Admitting: *Deleted

## 2013-09-26 NOTE — Telephone Encounter (Signed)
No answer, left message to call if questions or concerns. 

## 2013-11-07 ENCOUNTER — Encounter: Payer: Self-pay | Admitting: Internal Medicine

## 2013-12-04 ENCOUNTER — Other Ambulatory Visit: Payer: Self-pay

## 2013-12-04 MED ORDER — HYDROCHLOROTHIAZIDE 12.5 MG PO CAPS: 12.5000 mg | ORAL_CAPSULE | Freq: Every day | ORAL | Status: DC

## 2013-12-22 ENCOUNTER — Other Ambulatory Visit: Payer: Self-pay | Admitting: Internal Medicine

## 2014-01-11 ENCOUNTER — Other Ambulatory Visit: Payer: Self-pay

## 2014-01-14 ENCOUNTER — Encounter: Payer: Commercial Managed Care - PPO | Admitting: Internal Medicine

## 2014-01-15 ENCOUNTER — Other Ambulatory Visit: Payer: Self-pay

## 2014-01-15 MED ORDER — HYDROCHLOROTHIAZIDE 12.5 MG PO CAPS: 12.5000 mg | ORAL_CAPSULE | Freq: Every day | ORAL | Status: DC

## 2014-01-17 ENCOUNTER — Ambulatory Visit (INDEPENDENT_AMBULATORY_CARE_PROVIDER_SITE_OTHER): Payer: Commercial Managed Care - PPO | Admitting: Internal Medicine

## 2014-01-17 ENCOUNTER — Encounter: Payer: Self-pay | Admitting: Internal Medicine

## 2014-01-17 ENCOUNTER — Other Ambulatory Visit (INDEPENDENT_AMBULATORY_CARE_PROVIDER_SITE_OTHER): Payer: Commercial Managed Care - PPO

## 2014-01-17 VITALS — BP 160/98 | HR 59 | Temp 97.9°F | Resp 13 | Ht 67.25 in | Wt 175.2 lb

## 2014-01-17 DIAGNOSIS — Z0000 Encounter for general adult medical examination without abnormal findings: Secondary | ICD-10-CM

## 2014-01-17 DIAGNOSIS — Z23 Encounter for immunization: Secondary | ICD-10-CM

## 2014-01-17 DIAGNOSIS — I10 Essential (primary) hypertension: Secondary | ICD-10-CM

## 2014-01-17 DIAGNOSIS — E785 Hyperlipidemia, unspecified: Secondary | ICD-10-CM

## 2014-01-17 DIAGNOSIS — R7301 Impaired fasting glucose: Secondary | ICD-10-CM

## 2014-01-17 LAB — CBC WITH DIFFERENTIAL/PLATELET
BASOS ABS: 0 10*3/uL (ref 0.0–0.1)
Basophils Relative: 0.7 % (ref 0.0–3.0)
EOS ABS: 0.2 10*3/uL (ref 0.0–0.7)
Eosinophils Relative: 3 % (ref 0.0–5.0)
HCT: 40.7 % (ref 36.0–46.0)
Hemoglobin: 13.7 g/dL (ref 12.0–15.0)
Lymphocytes Relative: 32 % (ref 12.0–46.0)
Lymphs Abs: 2.3 10*3/uL (ref 0.7–4.0)
MCHC: 33.6 g/dL (ref 30.0–36.0)
MCV: 95.2 fl (ref 78.0–100.0)
MONO ABS: 0.5 10*3/uL (ref 0.1–1.0)
Monocytes Relative: 7.1 % (ref 3.0–12.0)
NEUTROS PCT: 57.2 % (ref 43.0–77.0)
Neutro Abs: 4 10*3/uL (ref 1.4–7.7)
PLATELETS: 220 10*3/uL (ref 150.0–400.0)
RBC: 4.28 Mil/uL (ref 3.87–5.11)
RDW: 13.1 % (ref 11.5–15.5)
WBC: 7.1 10*3/uL (ref 4.0–10.5)

## 2014-01-17 LAB — HEPATIC FUNCTION PANEL
ALT: 21 U/L (ref 0–35)
AST: 24 U/L (ref 0–37)
Albumin: 3.9 g/dL (ref 3.5–5.2)
Alkaline Phosphatase: 54 U/L (ref 39–117)
BILIRUBIN DIRECT: 0 mg/dL (ref 0.0–0.3)
BILIRUBIN TOTAL: 0.8 mg/dL (ref 0.2–1.2)
Total Protein: 7.7 g/dL (ref 6.0–8.3)

## 2014-01-17 LAB — LIPID PANEL
CHOL/HDL RATIO: 3
CHOLESTEROL: 198 mg/dL (ref 0–200)
HDL: 65.8 mg/dL (ref >39.00)
LDL Cholesterol: 108 mg/dL — ABNORMAL HIGH (ref 0–99)
NonHDL: 132.2
Triglycerides: 123 mg/dL (ref 0.0–149.0)
VLDL: 24.6 mg/dL (ref 0.0–40.0)

## 2014-01-17 LAB — BASIC METABOLIC PANEL
BUN: 19 mg/dL (ref 6–23)
CHLORIDE: 101 meq/L (ref 96–112)
CO2: 28 mEq/L (ref 19–32)
CREATININE: 0.8 mg/dL (ref 0.4–1.2)
Calcium: 9.5 mg/dL (ref 8.4–10.5)
GFR: 77.89 mL/min (ref >60.00)
Glucose, Bld: 89 mg/dL (ref 70–99)
Potassium: 3.9 mEq/L (ref 3.5–5.1)
Sodium: 136 mEq/L (ref 135–145)

## 2014-01-17 LAB — TSH: TSH: 2.28 u[IU]/mL (ref 0.35–4.50)

## 2014-01-17 LAB — URIC ACID: Uric Acid, Serum: 5.9 mg/dL (ref 2.4–7.0)

## 2014-01-17 MED ORDER — LOSARTAN POTASSIUM 100 MG PO TABS: 100.0000 mg | ORAL_TABLET | Freq: Every day | ORAL | Status: DC

## 2014-01-17 NOTE — Progress Notes (Signed)
Subjective:    Patient ID: Jasmine Bray, female    DOB: 1950/04/09, 63 y.o.   MRN: [id number]  HPI  She is here for a physical;acute issues denoted below.  She is on  heart healthy diet. She is involved in boot camp 3 times per week for 45 minutes. She has no cardiopulmonary symptoms with this activity.  She does not monitor her blood pressure on a regular basis.  Her ophthalmologic exam is up to date. She's been treated for dry eyes with artificial tears.        Review of Systems  She has had pain in the right third toe without specific injury or trigger . She describes urinary frequency. She also describes a sweet pungent odor to her urine in the morning at times. Constipation is a recurrent issue.  All below NEGATIVE: Chest pain, palpitations       Dyspnea Edema Claudication  Lightheadedness,Syncope Weight change Polyuria/phagia/dipsia    Blurred vision /diplopia/lossof vision Limb numbness/tingling/burning Non healing skin lesions Abd pain, bowel changes   Myalgias      Objective:   Physical Exam   Gen.: Healthy and well-nourished in appearance. Alert, appropriate and cooperative throughout exam. Appears younger than stated age  Head: Normocephalic without obvious abnormalities  Eyes: No corneal or conjunctival inflammation noted. Pupils equal round reactive to light and accommodation. Extraocular motion intact.  Ears: External  ear exam reveals no significant lesions or deformities. Canals clear .TMs normal. Hearing is grossly normal bilaterally. Nose: External nasal exam reveals no deformity or inflammation. Nasal mucosa are pink and moist. No lesions or exudates noted.   Mouth: Oral mucosa and oropharynx reveal no lesions or exudates. Teeth in good repair. Neck: No deformities, masses, or tenderness noted. Range of motion & Thyroid  normal. Lungs: Normal respiratory effort; chest expands symmetrically. Lungs are clear to auscultation without rales,  wheezes, or increased work of breathing. Heart: Normal rate and rhythm. Normal S1 and S2. No gallop, click, or rub. No murmur. Repeat BP 132/98 Abdomen: Bowel sounds normal; abdomen soft and nontender. No masses, organomegaly or hernias noted. Genitalia:  as per Gyn                                  Musculoskeletal/extremities: No deformity or scoliosis noted of  the thoracic or lumbar spine. No clubbing, cyanosis,or edema noted. Range of motion normal .Tone & strength normal. Hand joints reveal mixed PIP &  DIP changes. Enlarged distal 3rd R toe.Hammer toe changes. Fingernail / toenail health good. Able to lie down & sit up w/o help. Negative SLR bilaterally Vascular: Carotid, radial artery, dorsalis pedis and  posterior tibial pulses are full and equal. No bruits present. Neurologic: Alert and oriented x3. Deep tendon reflexes symmetrical and normal.  Gait normal .       Skin: Intact without suspicious lesions or rashes. Lymph: No cervical, axillary lymphadenopathy present. Psych: Mood and affect are normal. Normally interactive  Assessment & Plan:  #1 comprehensive physical exam; no acute findings  Plan: see Orders  & Recommendations

## 2014-01-17 NOTE — Progress Notes (Signed)
Pre visit review using our clinic review tool, if applicable. No additional management support is needed unless otherwise documented below in the visit note. 

## 2014-01-17 NOTE — Patient Instructions (Signed)
Your next office appointment will be determined based upon review of your pending labs . Those instructions will be transmitted to you through My Chart Followup as needed for your acute issue. Please report any significant change in your symptoms. Minimal Blood Pressure Goal= AVERAGE < 140/90;  Ideal is an AVERAGE < 135/85. This AVERAGE should be calculated from @ least 5-7 BP readings taken @ different times of day on different days of week. You should not respond to isolated BP readings , but rather the AVERAGE for that week .Please bring your  blood pressure cuff to office visits to verify that it is reliable.It  can also be checked against the blood pressure device at the pharmacy. Finger or wrist cuffs are not dependable; an arm cuff is. Fill the  prescription for the BP medication if BP NOT @ goal based on  7 to 14 day average.

## 2014-01-18 ENCOUNTER — Telehealth: Payer: Self-pay | Admitting: Internal Medicine

## 2014-01-18 NOTE — Telephone Encounter (Signed)
emmi emailed °

## 2014-01-29 ENCOUNTER — Other Ambulatory Visit: Payer: Self-pay

## 2014-01-29 MED ORDER — SIMVASTATIN 40 MG PO TABS: ORAL_TABLET | ORAL | Status: DC

## 2014-03-15 ENCOUNTER — Other Ambulatory Visit: Payer: Self-pay | Admitting: Internal Medicine

## 2014-07-09 ENCOUNTER — Other Ambulatory Visit: Payer: Self-pay | Admitting: Internal Medicine

## 2014-07-15 ENCOUNTER — Ambulatory Visit: Payer: Commercial Managed Care - PPO

## 2014-07-19 ENCOUNTER — Other Ambulatory Visit: Payer: Self-pay | Admitting: Internal Medicine

## 2015-01-20 ENCOUNTER — Encounter: Payer: Self-pay | Admitting: Internal Medicine

## 2015-01-20 ENCOUNTER — Other Ambulatory Visit (INDEPENDENT_AMBULATORY_CARE_PROVIDER_SITE_OTHER): Payer: Commercial Managed Care - PPO

## 2015-01-20 ENCOUNTER — Ambulatory Visit (INDEPENDENT_AMBULATORY_CARE_PROVIDER_SITE_OTHER): Payer: Commercial Managed Care - PPO | Admitting: Internal Medicine

## 2015-01-20 VITALS — BP 150/86 | HR 59 | Temp 97.7°F | Resp 16 | Ht 67.0 in | Wt 180.0 lb

## 2015-01-20 DIAGNOSIS — E785 Hyperlipidemia, unspecified: Secondary | ICD-10-CM

## 2015-01-20 DIAGNOSIS — R7989 Other specified abnormal findings of blood chemistry: Secondary | ICD-10-CM | POA: Diagnosis not present

## 2015-01-20 DIAGNOSIS — Z0000 Encounter for general adult medical examination without abnormal findings: Secondary | ICD-10-CM | POA: Diagnosis not present

## 2015-01-20 DIAGNOSIS — Z23 Encounter for immunization: Secondary | ICD-10-CM | POA: Diagnosis not present

## 2015-01-20 DIAGNOSIS — I10 Essential (primary) hypertension: Secondary | ICD-10-CM

## 2015-01-20 LAB — CBC WITH DIFFERENTIAL/PLATELET
BASOS PCT: 0.6 % (ref 0.0–3.0)
Basophils Absolute: 0 10*3/uL (ref 0.0–0.1)
EOS ABS: 0.2 10*3/uL (ref 0.0–0.7)
EOS PCT: 3.3 % (ref 0.0–5.0)
HEMATOCRIT: 41.4 % (ref 36.0–46.0)
HEMOGLOBIN: 13.9 g/dL (ref 12.0–15.0)
LYMPHS PCT: 32.6 % (ref 12.0–46.0)
Lymphs Abs: 2.2 10*3/uL (ref 0.7–4.0)
MCHC: 33.5 g/dL (ref 30.0–36.0)
MCV: 95.9 fl (ref 78.0–100.0)
MONO ABS: 0.5 10*3/uL (ref 0.1–1.0)
Monocytes Relative: 6.9 % (ref 3.0–12.0)
NEUTROS ABS: 3.9 10*3/uL (ref 1.4–7.7)
Neutrophils Relative %: 56.6 % (ref 43.0–77.0)
PLATELETS: 232 10*3/uL (ref 150.0–400.0)
RBC: 4.31 Mil/uL (ref 3.87–5.11)
RDW: 13.3 % (ref 11.5–15.5)
WBC: 6.9 10*3/uL (ref 4.0–10.5)

## 2015-01-20 LAB — COMPREHENSIVE METABOLIC PANEL
ALBUMIN: 4.2 g/dL (ref 3.5–5.2)
ALT: 32 U/L (ref 0–35)
AST: 31 U/L (ref 0–37)
Alkaline Phosphatase: 60 U/L (ref 39–117)
BUN: 16 mg/dL (ref 6–23)
CALCIUM: 9.4 mg/dL (ref 8.4–10.5)
CHLORIDE: 100 meq/L (ref 96–112)
CO2: 28 meq/L (ref 19–32)
Creatinine, Ser: 0.75 mg/dL (ref 0.40–1.20)
GFR: 82.44 mL/min (ref >60.00)
Glucose, Bld: 110 mg/dL — ABNORMAL HIGH (ref 70–99)
POTASSIUM: 3.9 meq/L (ref 3.5–5.1)
Sodium: 137 mEq/L (ref 135–145)
Total Bilirubin: 0.5 mg/dL (ref 0.2–1.2)
Total Protein: 7.1 g/dL (ref 6.0–8.3)

## 2015-01-20 LAB — LIPID PANEL
CHOL/HDL RATIO: 4
Cholesterol: 195 mg/dL (ref 0–200)
HDL: 51.9 mg/dL (ref >39.00)
NonHDL: 143.22
Triglycerides: 216 mg/dL — ABNORMAL HIGH (ref 0.0–149.0)
VLDL: 43.2 mg/dL — AB (ref 0.0–40.0)

## 2015-01-20 LAB — LDL CHOLESTEROL, DIRECT: LDL DIRECT: 113 mg/dL

## 2015-01-20 LAB — TSH: TSH: 1.94 u[IU]/mL (ref 0.35–4.50)

## 2015-01-20 MED ORDER — SIMVASTATIN 40 MG PO TABS: 40.0000 mg | ORAL_TABLET | Freq: Every day | ORAL | Status: DC

## 2015-01-20 MED ORDER — HYDROCHLOROTHIAZIDE 25 MG PO TABS: 25.0000 mg | ORAL_TABLET | Freq: Every day | ORAL | Status: DC

## 2015-01-20 NOTE — Patient Instructions (Addendum)
We increased your blood pressure medication hydrochlorothiazide from 12.5 mg daily to 25 mg daily.  Monitor your BP at home and call with any questions or concerns.  Your prescriptions have been sent to your pharmacy.  You have received the shingles vaccine.  Your blood work has been ordered and we will let you know the results within 72 hours.   Health Maintenance, Female Adopting a healthy lifestyle and getting preventive care can go a long way to promote health and wellness. Talk with your health care provider about what schedule of regular examinations is right for you. This is a good chance for you to check in with your provider about disease prevention and staying healthy. In between checkups, there are plenty of things you can do on your own. Experts have done a lot of research about which lifestyle changes and preventive measures are most likely to keep you healthy. Ask your health care provider for more information. WEIGHT AND DIET  Eat a healthy diet  Be sure to include plenty of vegetables, fruits, low-fat dairy products, and lean protein.  Do not eat a lot of foods high in solid fats, added sugars, or salt.  Get regular exercise. This is one of the most important things you can do for your health.  Most adults should exercise for at least 150 minutes each week. The exercise should increase your heart rate and make you sweat (moderate-intensity exercise).  Most adults should also do strengthening exercises at least twice a week. This is in addition to the moderate-intensity exercise.  Maintain a healthy weight  Body mass index (BMI) is a measurement that can be used to identify possible weight problems. It estimates body fat based on height and weight. Your health care provider can help determine your BMI and help you achieve or maintain a healthy weight.  For females 32 years of age and older:   A BMI below 18.5 is considered underweight.  A BMI of 18.5 to 24.9 is  normal.  A BMI of 25 to 29.9 is considered overweight.  A BMI of 30 and above is considered obese.  Watch levels of cholesterol and blood lipids  You should start having your blood tested for lipids and cholesterol at 65 years of age, then have this test every 5 years.  You may need to have your cholesterol levels checked more often if:  Your lipid or cholesterol levels are high.  You are older than 65 years of age.  You are at high risk for heart disease.  CANCER SCREENING   Lung Cancer  Lung cancer screening is recommended for adults 69-92 years old who are at high risk for lung cancer because of a history of smoking.  A yearly low-dose CT scan of the lungs is recommended for people who:  Currently smoke.  Have quit within the past 15 years.  Have at least a 30-pack-year history of smoking. A pack year is smoking an average of one pack of cigarettes a day for 1 year.  Yearly screening should continue until it has been 15 years since you quit.  Yearly screening should stop if you develop a health problem that would prevent you from having lung cancer treatment.  Breast Cancer  Practice breast self-awareness. This means understanding how your breasts normally appear and feel.  It also means doing regular breast self-exams. Let your health care provider know about any changes, no matter how small.  If you are in your 20s or 30s, you should  have a clinical breast exam (CBE) by a health care provider every 1-3 years as part of a regular health exam.  If you are 69 or older, have a CBE every year. Also consider having a breast X-ray (mammogram) every year.  If you have a family history of breast cancer, talk to your health care provider about genetic screening.  If you are at high risk for breast cancer, talk to your health care provider about having an MRI and a mammogram every year.  Breast cancer gene (BRCA) assessment is recommended for women who have family members  with BRCA-related cancers. BRCA-related cancers include:  Breast.  Ovarian.  Tubal.  Peritoneal cancers.  Results of the assessment will determine the need for genetic counseling and BRCA1 and BRCA2 testing. Cervical Cancer Your health care provider may recommend that you be screened regularly for cancer of the pelvic organs (ovaries, uterus, and vagina). This screening involves a pelvic examination, including checking for microscopic changes to the surface of your cervix (Pap test). You may be encouraged to have this screening done every 3 years, beginning at age 24.  For women ages 86-65, health care providers may recommend pelvic exams and Pap testing every 3 years, or they may recommend the Pap and pelvic exam, combined with testing for human papilloma virus (HPV), every 5 years. Some types of HPV increase your risk of cervical cancer. Testing for HPV may also be done on women of any age with unclear Pap test results.  Other health care providers may not recommend any screening for nonpregnant women who are considered low risk for pelvic cancer and who do not have symptoms. Ask your health care provider if a screening pelvic exam is right for you.  If you have had past treatment for cervical cancer or a condition that could lead to cancer, you need Pap tests and screening for cancer for at least 20 years after your treatment. If Pap tests have been discontinued, your risk factors (such as having a new sexual partner) need to be reassessed to determine if screening should resume. Some women have medical problems that increase the chance of getting cervical cancer. In these cases, your health care provider may recommend more frequent screening and Pap tests. Colorectal Cancer  This type of cancer can be detected and often prevented.  Routine colorectal cancer screening usually begins at 65 years of age and continues through 65 years of age.  Your health care provider may recommend  screening at an earlier age if you have risk factors for colon cancer.  Your health care provider may also recommend using home test kits to check for hidden blood in the stool.  A small camera at the end of a tube can be used to examine your colon directly (sigmoidoscopy or colonoscopy). This is done to check for the earliest forms of colorectal cancer.  Routine screening usually begins at age 75.  Direct examination of the colon should be repeated every 5-10 years through 65 years of age. However, you may need to be screened more often if early forms of precancerous polyps or small growths are found. Skin Cancer  Check your skin from head to toe regularly.  Tell your health care provider about any new moles or changes in moles, especially if there is a change in a mole's shape or color.  Also tell your health care provider if you have a mole that is larger than the size of a pencil eraser.  Always use sunscreen. Apply  sunscreen liberally and repeatedly throughout the day.  Protect yourself by wearing long sleeves, pants, a wide-brimmed hat, and sunglasses whenever you are outside. HEART DISEASE, DIABETES, AND HIGH BLOOD PRESSURE   High blood pressure causes heart disease and increases the risk of stroke. High blood pressure is more likely to develop in:  People who have blood pressure in the high end of the normal range (130-139/85-89 mm Hg).  People who are overweight or obese.  People who are African American.  If you are 36-50 years of age, have your blood pressure checked every 3-5 years. If you are 39 years of age or older, have your blood pressure checked every year. You should have your blood pressure measured twice--once when you are at a hospital or clinic, and once when you are not at a hospital or clinic. Record the average of the two measurements. To check your blood pressure when you are not at a hospital or clinic, you can use:  An automated blood pressure machine at a  pharmacy.  A home blood pressure monitor.  If you are between 7 years and 19 years old, ask your health care provider if you should take aspirin to prevent strokes.  Have regular diabetes screenings. This involves taking a blood sample to check your fasting blood sugar level.  If you are at a normal weight and have a low risk for diabetes, have this test once every three years after 65 years of age.  If you are overweight and have a high risk for diabetes, consider being tested at a younger age or more often. PREVENTING INFECTION  Hepatitis B  If you have a higher risk for hepatitis B, you should be screened for this virus. You are considered at high risk for hepatitis B if:  You were born in a country where hepatitis B is common. Ask your health care provider which countries are considered high risk.  Your parents were born in a high-risk country, and you have not been immunized against hepatitis B (hepatitis B vaccine).  You have HIV or AIDS.  You use needles to inject street drugs.  You live with someone who has hepatitis B.  You have had sex with someone who has hepatitis B.  You get hemodialysis treatment.  You take certain medicines for conditions, including cancer, organ transplantation, and autoimmune conditions. Hepatitis C  Blood testing is recommended for:  Everyone born from [birth year] through 1966.  Anyone with known risk factors for hepatitis C. Sexually transmitted infections (STIs)  You should be screened for sexually transmitted infections (STIs) including gonorrhea and chlamydia if:  You are sexually active and are younger than 65 years of age.  You are older than 65 years of age and your health care provider tells you that you are at risk for this type of infection.  Your sexual activity has changed since you were last screened and you are at an increased risk for chlamydia or gonorrhea. Ask your health care provider if you are at risk.  If you do not  have HIV, but are at risk, it may be recommended that you take a prescription medicine daily to prevent HIV infection. This is called pre-exposure prophylaxis (PrEP). You are considered at risk if:  You are sexually active and do not regularly use condoms or know the HIV status of your partner(s).  You take drugs by injection.  You are sexually active with a partner who has HIV. Talk with your health care provider about whether  you are at high risk of being infected with HIV. If you choose to begin PrEP, you should first be tested for HIV. You should then be tested every 3 months for as long as you are taking PrEP.  PREGNANCY   If you are premenopausal and you may become pregnant, ask your health care provider about preconception counseling.  If you may become pregnant, take 400 to 800 micrograms (mcg) of folic acid every day.  If you want to prevent pregnancy, talk to your health care provider about birth control (contraception). OSTEOPOROSIS AND MENOPAUSE   Osteoporosis is a disease in which the bones lose minerals and strength with aging. This can result in serious bone fractures. Your risk for osteoporosis can be identified using a bone density scan.  If you are 64 years of age or older, or if you are at risk for osteoporosis and fractures, ask your health care provider if you should be screened.  Ask your health care provider whether you should take a calcium or vitamin D supplement to lower your risk for osteoporosis.  Menopause may have certain physical symptoms and risks.  Hormone replacement therapy may reduce some of these symptoms and risks. Talk to your health care provider about whether hormone replacement therapy is right for you.  HOME CARE INSTRUCTIONS   Schedule regular health, dental, and eye exams.  Stay current with your immunizations.   Do not use any tobacco products including cigarettes, chewing tobacco, or electronic cigarettes.  If you are pregnant, do not  drink alcohol.  If you are breastfeeding, limit how much and how often you drink alcohol.  Limit alcohol intake to no more than 1 drink per day for nonpregnant women. One drink equals 12 ounces of beer, 5 ounces of wine, or 1 ounces of hard liquor.  Do not use street drugs.  Do not share needles.  Ask your health care provider for help if you need support or information about quitting drugs.  Tell your health care provider if you often feel depressed.  Tell your health care provider if you have ever been abused or do not feel safe at home.   This information is not intended to replace advice given to you by your health care provider. Make sure you discuss any questions you have with your health care provider.   Document Released: 09/28/2010 Document Revised: 04/05/2014 Document Reviewed: 02/14/2013 Elsevier Interactive Patient Education [employer].

## 2015-01-20 NOTE — Progress Notes (Signed)
Subjective:    Patient ID: Jasmine Bray, female    DOB: [birth year], 64 y.o.   MRN: [id number]  HPI She is here for a physical exam.  She has no concerns.  She has arthritis in her hands.  She takes tumeric and glucosamine.  She has noticed decreased swelling and pain in her hands since then.  She denies any other joint problems.  She is exercising regularly and plans on using working on weight loss.  She gained weight when she was not able to exercise because of a knee injury.  She monitors her BP at home regularly.  It typically runs 130-150/80s.    She drinks alcohol daily and plans on cutting back on the amount that she drinks.   Medications and allergies reviewed with patient and updated if appropriate.  Patient Active Problem List   Diagnosis Date Noted  . Right shoulder pain 04/24/2013  . Fasting hyperglycemia 01/10/2012  . Hyperlipidemia 11/11/2009  . 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 Essential hypertension 11/11/2009  . GERD 11/11/2009    Past Medical History  Diagnosis Date  . Hyperlipidemia   . Hypertension   . GERD (gastroesophageal reflux disease)   . Allergy     RHINITIS  . Anxiety 1989    Dr Jake Michaelis, [place]  . Depression     Dr Toy Care  . Fibroids     Past Surgical History  Procedure Laterality Date  . Myomectomy      FOR FIBROIDS, Dr Ubaldo Glassing  . Anal fistula ectomy       X 2, Dr Rosana Hoes  . Abdominal hysterectomy      & USO  . Colonoscopy  2004, 2015    Carlean Purl  . Steroid injection shoulder  05/11/2013    contusion/strain; Dr Onnie Graham    Social History   Social History  . Marital Status: Divorced    Spouse Name: N/A  . Number of Children: N/A  . Years of Education: N/A   Occupational History  . SALES    Social History Main Topics  . Smoking status: Never Smoker   . Smokeless tobacco: Never Used  . Alcohol Use: Yes     Comment:  1-2 / day  . Drug Use: No  . Sexual Activity: Not Asked   Other Topics Concern  . None   Social  History Narrative   GETS REG EXERCISE          Review of Systems  Constitutional: Positive for fatigue. Negative for fever, chills and appetite change.  HENT: Negative for hearing loss.   Eyes: Negative for visual disturbance.       Dry eyes, severe  Respiratory: Negative for cough, shortness of breath and wheezing.   Cardiovascular: Negative.   Gastrointestinal: Negative for nausea, abdominal pain, diarrhea, constipation and blood in stool.       No GERD  Genitourinary: Negative for dysuria and hematuria.  Musculoskeletal: Positive for arthralgias (shoulder - old injury, hand arthritis).  Neurological: Negative for dizziness, weakness, light-headedness, numbness and headaches.       Objective:   Filed Vitals:   01/20/15 0911  BP: 150/86  Pulse: 59  Temp: 97.7 F (36.5 C)  Resp: 16   Filed Weights   01/20/15 0911  Weight: 180 lb (81.647 kg)   Body mass index is 28.19 kg/(m^2).   Physical Exam  Constitutional: She is oriented to person, place, and time. She appears well-developed and well-nourished. No distress.  HENT:  Head: Normocephalic  and atraumatic.  Right Ear: External ear normal.  Left Ear: External ear normal.  Nose: Nose normal.  Mouth/Throat: Oropharynx is clear and moist.  Eyes: Conjunctivae and EOM are normal.  Neck: Neck supple. No tracheal deviation present. No thyromegaly present.  No carotid bruit  Cardiovascular: Normal rate, regular rhythm, normal heart sounds and intact distal pulses.   No murmur heard. Pulmonary/Chest: Effort normal and breath sounds normal. No respiratory distress. She has no wheezes.  Abdominal: Soft. Bowel sounds are normal. She exhibits no distension. There is no tenderness.  Musculoskeletal: She exhibits no edema.  Lymphadenopathy:    She has no cervical adenopathy.  Neurological: She is alert and oriented to person, place, and time.  Skin: Skin is warm and dry. No rash noted. She is not diaphoretic.  Psychiatric: She  has a normal mood and affect. Her behavior is normal.          Assessment & Plan:   Physical Exam: Screening blood work ordered Shingles vaccine today Will get flu vaccine at [facility] Colonoscopy up to date Mammogram  up todate Pap/gyn up to date dexa done about 5 years ago and was normal ekg - will defer, asymptomatic with exercise Exercise: regular and trying to increase slowly Working on weight loss Working on decreasing alcohol intake Skin cancer prevention -- sees derm yearly  See problem list for further a/p  Follow up annually

## 2015-01-20 NOTE — Assessment & Plan Note (Signed)
Chronic, related to a fall off a horse years ago Only hurts when exercising

## 2015-01-20 NOTE — Assessment & Plan Note (Signed)
bp elevated here and slightly high at home Increase hctz to 25 mg daily Increasing exercising and working on weight loss She will monitor at home

## 2015-01-20 NOTE — Assessment & Plan Note (Signed)
Will check lipid panel, cmp On simvastatin 40 mg daily Exercising regularly Working on weight loss Complaint with a low fat/cholesterol diet

## 2015-01-20 NOTE — Progress Notes (Signed)
Pre visit review using our clinic review tool, if applicable. No additional management support is needed unless otherwise documented below in the visit note. 

## 2015-01-22 ENCOUNTER — Encounter: Payer: Commercial Managed Care - PPO | Admitting: Internal Medicine

## 2015-01-22 ENCOUNTER — Encounter: Payer: Self-pay | Admitting: Internal Medicine

## 2015-01-22 LAB — VITAMIN D 1,25 DIHYDROXY
VITAMIN D3 1, 25 (OH): 51 pg/mL
Vitamin D 1, 25 (OH)2 Total: 51 pg/mL (ref 18–72)

## 2015-10-14 DIAGNOSIS — B001 Herpesviral vesicular dermatitis: Secondary | ICD-10-CM | POA: Diagnosis not present

## 2015-10-14 DIAGNOSIS — Z01419 Encounter for gynecological examination (general) (routine) without abnormal findings: Secondary | ICD-10-CM | POA: Diagnosis not present

## 2015-10-14 DIAGNOSIS — Z6827 Body mass index (BMI) 27.0-27.9, adult: Secondary | ICD-10-CM | POA: Diagnosis not present

## 2015-10-14 DIAGNOSIS — Z79890 Hormone replacement therapy (postmenopausal): Secondary | ICD-10-CM | POA: Diagnosis not present

## 2015-10-14 DIAGNOSIS — Z780 Asymptomatic menopausal state: Secondary | ICD-10-CM | POA: Diagnosis not present

## 2015-10-14 DIAGNOSIS — Z1231 Encounter for screening mammogram for malignant neoplasm of breast: Secondary | ICD-10-CM | POA: Diagnosis not present

## 2015-10-22 DIAGNOSIS — D239 Other benign neoplasm of skin, unspecified: Secondary | ICD-10-CM | POA: Diagnosis not present

## 2015-10-22 DIAGNOSIS — L570 Actinic keratosis: Secondary | ICD-10-CM | POA: Diagnosis not present

## 2015-10-22 DIAGNOSIS — L821 Other seborrheic keratosis: Secondary | ICD-10-CM | POA: Diagnosis not present

## 2015-10-22 DIAGNOSIS — D1801 Hemangioma of skin and subcutaneous tissue: Secondary | ICD-10-CM | POA: Diagnosis not present

## 2015-10-22 DIAGNOSIS — L814 Other melanin hyperpigmentation: Secondary | ICD-10-CM | POA: Diagnosis not present

## 2015-10-22 DIAGNOSIS — D2221 Melanocytic nevi of right ear and external auricular canal: Secondary | ICD-10-CM | POA: Diagnosis not present

## 2015-11-06 ENCOUNTER — Other Ambulatory Visit (INDEPENDENT_AMBULATORY_CARE_PROVIDER_SITE_OTHER): Payer: PPO

## 2015-11-06 ENCOUNTER — Ambulatory Visit (INDEPENDENT_AMBULATORY_CARE_PROVIDER_SITE_OTHER): Payer: PPO | Admitting: Family

## 2015-11-06 ENCOUNTER — Encounter: Payer: Self-pay | Admitting: Family

## 2015-11-06 VITALS — BP 178/104 | HR 68 | Temp 98.0°F | Resp 16 | Ht 67.0 in | Wt 175.8 lb

## 2015-11-06 DIAGNOSIS — I10 Essential (primary) hypertension: Secondary | ICD-10-CM | POA: Diagnosis not present

## 2015-11-06 DIAGNOSIS — R000 Tachycardia, unspecified: Secondary | ICD-10-CM

## 2015-11-06 LAB — BASIC METABOLIC PANEL
BUN: 11 mg/dL (ref 6–23)
CALCIUM: 9.6 mg/dL (ref 8.4–10.5)
CO2: 28 mEq/L (ref 19–32)
CREATININE: 0.73 mg/dL (ref 0.40–1.20)
Chloride: 100 mEq/L (ref 96–112)
GFR: 84.85 mL/min (ref >60.00)
Glucose, Bld: 99 mg/dL (ref 70–99)
Potassium: 3.6 mEq/L (ref 3.5–5.1)
Sodium: 136 mEq/L (ref 135–145)

## 2015-11-06 MED ORDER — METOPROLOL SUCCINATE ER 25 MG PO TB24: 25.0000 mg | ORAL_TABLET | Freq: Every day | ORAL | 0 refills | Status: DC

## 2015-11-06 NOTE — Assessment & Plan Note (Addendum)
Transient tachycardia with concern for SVT which resolved with Xanax and relaxation. In office EKG shows normal sinus rhythm with cardiac exam being normal. Obtain BMET to check electrolytes. Given fatigue and increased shortness of breath will obtain cardiac stress test to rule out possible ischemia. Continue to monitor pending results.

## 2015-11-06 NOTE — Assessment & Plan Note (Signed)
Blood pressure remains elevated above goal 150/90 with current regimen. No adverse side effects of medication or worse headache of life. No symptoms of end organ damage present on exam. Continue current dosage of hydrochlorothiazide. Start metoprolol for blood pressure as well as heart rate control. Continue to monitor blood pressure at home. Decrease sodium in diet. Follow-up in 2 weeks or sooner if needed.

## 2015-11-06 NOTE — Patient Instructions (Signed)
Thank you for choosing [facility].  Summary/Instructions:  Your prescription(s) have been submitted to your pharmacy or been printed and provided for you. Please take as directed and contact our office if you believe you are having problem(s) with the medication(s) or have any questions.  Please stop by the lab on the lower level of the building for your blood work. Your results will be released to Jasmine Bray (or called to you) after review, usually within 72 hours after test completion. If any changes need to be made, you will be notified at that same time.  1. The lab is open from 7:30am to 5:30 pm Monday-Friday  2. No appointment is necessary  3. Fasting (if needed) is 6-8 hours after food and drink; black coffee  and water are okay   If your symptoms worsen or fail to improve, please contact our office for further instruction, or in case of emergency go directly to the emergency room at the closest medical facility.    Paroxysmal Supraventricular Tachycardia Paroxysmal supraventricular tachycardia (PSVT) is a type of abnormal heart rhythm. It causes your heart to beat very quickly and then suddenly stop beating so quickly. A normal heart rate is 60-100 beats per minute. During an episode of PSVT, your heart rate may be 150-250 beats per minute. This can make you feel light-headed and short of breath. An episode of PSVT can be frightening. It is usually not dangerous. The heart has four chambers. All chambers need to work together for the heart to beat effectively. A normal heartbeat usually starts in the right upper chamber of the heart (atrium) when an area (sinoatrial node) puts out an electrical signal that spreads to the other chambers. People with PSVT may have abnormal electrical pathways, or they may have other areas in the upper chambers that send out electrical signals. The result is a very rapid heartbeat. When your heart beats very quickly, it does not have time to fill  completely with blood. When PSVT happens often or it lasts for long periods, it can lead to heart weakness and failure. Most people with PSVT do not have any other heart disease. CAUSES Abnormal electrical activity in the heart causes PSVT. It is not known why some people get PSVT and others do not. RISK FACTORS You may be more likely to have PSVT if:  You are 39-79 years old.  You are a woman. Other factors that may increase your chances of an attack include:  Stress.  Being tired.  Smoking.  Stimulant drugs.  Alcoholic drinks.  Caffeine.  Pregnancy. SIGNS AND SYMPTOMS A mild episode of PSVT may cause no symptoms. If you do have signs and symptoms, they may include:  A pounding heart.  Feeling of skipped heartbeats (palpitations).  Weakness.  Shortness of breath.  Tightness or pain in your chest.  Light-headedness.  Anxiety.  Dizziness.  Sweating.  Nausea.  A fainting spell. DIAGNOSIS Your health care provider may suspect PSVT if you have symptoms that come and go. The health care provider will do a physical exam. If you are having an episode during the exam, the health care provider may be able to diagnose PSVT by listening to your heart and feeling your pulse. Tests may also be done, including:  An electrical study of your heart (electrocardiogram, or ECG).  A test in which you wear a portable ECG monitor all day (Holter monitor) or for several days (event monitor).  A test that involves taking an image of your heart  using sound waves (echocardiogram) to rule out other causes of a fast heart rate. TREATMENT You may not need treatment if episodes of PSVT do not happen often or if they do not cause symptoms. If PSVT episodes do cause symptoms, your health care provider may first suggest trying a self-treatment called vagus nerve stimulation. The vagus nerve extends down from the brain. It regulates certain body functions. Stimulating this nerve can slow down  the heart. Your health care provider can teach you ways to do this. You may need to try a few ways to find what works best for you. Options include:  Holding your breath and pushing, as though you are having a bowel movement.  Massaging an area on one side of your neck below your jaw.  Bending forward with your head between your legs.  Bending forward with your head between your legs and coughing.  Massaging your eyeballs with your eyes closed. If vagus nerve stimulation does not work, other treatment options include:  Medicines to prevent an attack.  Being treated in the hospital with medicine or electric shock to stop an attack (cardioversion). This treatment can include:  Getting medicine through an IV line.  Having a small electric shock delivered to your heart. You will be given medicine to make you sleep through this procedure.  If you have frequent episodes with symptoms, you may need a procedure to get rid of the faulty areas of your heart (radiofrequency ablation) and end the episodes of PSVT. In this procedure:  A long, thin tube (catheter) is passed through one of your veins into your heart.  Energy directed through the catheter eliminates the areas of your heart that are causing abnormal electric stimulation. HOME CARE INSTRUCTIONS  Take medicines only as directed by your health care provider.  Do not use caffeine in any form if caffeine triggers episodes of PSVT. Otherwise, consume caffeine in moderation. This means no more than a few cups of coffee or the equivalent each day.  Do not drink alcohol if alcohol triggers episodes of PSVT. Otherwise, limit alcohol intake to no more than 1 drink per day for nonpregnant women and 2 drinks per day for men. One drink equals 12 ounces of beer, 5 ounces of wine, or 1 ounces of hard liquor.  Do not use any tobacco products, including cigarettes, chewing tobacco, or electronic cigarettes. If you need help quitting, ask your health  care provider.  Try to get at least 7 hours of sleep each night.  Find healthy ways to manage stress.  Perform vagus nerve stimulation as directed by your health care provider.  Maintain a healthy weight.  Get some exercise on most days. Ask your health care provider to suggest some good activities for you. SEEK MEDICAL CARE IF:  You are having episodes of PSVT more often, or they are lasting longer.  Vagus nerve stimulation is no longer helping.  You have new symptoms during an episode. SEEK IMMEDIATE MEDICAL CARE IF:  You have chest pain or trouble breathing.  You have an episode of PSVT that has lasted longer than 20 minutes.  You have passed out from an episode of PSVT. These symptoms may represent a serious problem that is an emergency. Do not wait to see if the symptoms will go away. Get medical help right away. Call your local emergency services (911 in the [place] Do not drive yourself to the hospital.   This information is not intended to replace advice given to you by  your health care provider. Make sure you discuss any questions you have with your health care provider.   Document Released: 03/15/2005 Document Revised: 04/05/2014 Document Reviewed: 08/23/2013 Elsevier Interactive Patient Education [employer].

## 2015-11-06 NOTE — Progress Notes (Signed)
Subjective:    Patient ID: MATINA HOHNSTEIN, female    DOB: 17-Sep-1950, 65 y.o.   MRN: [id number]  Chief Complaint  Patient presents with  . Tachycardia    on monday had an issue with heart racing BP was 198/160 pulse was 178, never had this issue before    HPI:  TALMA LIVELY is a 65 y.o. female who  has a past medical history of Allergy; Anxiety (1989); Depression; Fibroids; GERD (11/11/2009); GERD (gastroesophageal reflux disease); Hyperlipidemia; and Hypertension. and presents today for an acute office visit.   This is a new problem. Associated symptoms of faitness and lightheadedness that occurred for an episode about 3 days lasted about 1 hour. When she took her blood pressure it was 198/160 and her heart rate was 178. There were no triggering factors. Modifying factors include Xanax which caused her to relax and sleep. She notes the symptoms were improved when she woke up. Describes that she does tire a little more easily than she used to. She has not had any episodes since the initial episodes. No current symptoms. Blood pressure continues to remain elevated with no worse headache of her life or symptoms of end organ damage.    No Known Allergies   Current Outpatient Prescriptions on File Prior to Visit  Medication Sig Dispense Refill  . ALPRAZolam (XANAX) 1 MG tablet Take 1 mg by mouth as needed.    Marland Kitchen aspirin EC 81 MG tablet Take 81 mg by mouth daily.    Marland Kitchen b complex vitamins tablet Take 1 tablet by mouth daily.    . Biotin 1 MG CAPS Take 1 tablet by mouth daily.    Marland Kitchen buPROPion (WELLBUTRIN XL) 150 MG 24 hr tablet Take 150 mg by mouth daily.    . Cholecalciferol (VITAMIN D3) 3000 UNITS TABS Take 1 capsule by mouth daily. Taking 1000 units daily    . escitalopram (LEXAPRO) 20 MG tablet Take 10 mg by mouth daily.     Marland Kitchen estradiol (ESTRACE) 0.1 MG/GM vaginal cream Place 2 g vaginally. 1 GRAM 3x A WEEK     . estradiol (EVAMIST) 1.53 MG/SPRAY transdermal spray Place 1 spray onto the  skin daily.    . Garlic 123XX123 MG CAPS Take 1 capsule by mouth daily.    . hydrochlorothiazide (HYDRODIURIL) 25 MG tablet Take 1 tablet (25 mg total) by mouth daily. 90 tablet 3  . Misc Natural Products (COSAMIN ASU ADVANCED FORMULA PO) Take by mouth 2 (two) times daily.    . Multiple Vitamin (MULTIVITAMIN) tablet Take 1 tablet by mouth daily.    . Omega-3 Fatty Acids (FISH OIL) 1000 MG CAPS Take by mouth daily.    . Probiotic Product (PROBIOTIC PEARLS PO) Take 1 tablet by mouth daily.    . simvastatin (ZOCOR) 40 MG tablet Take 1 tablet (40 mg total) by mouth at bedtime. 90 tablet 3  . Turmeric 500 MG CAPS Take 500 mg by mouth daily.    . valACYclovir (VALTREX) 1000 MG tablet Take 1,000 mg by mouth. 1/2 by mouth once daily     No current facility-administered medications on file prior to visit.      Past Surgical History:  Procedure Laterality Date  . ABDOMINAL HYSTERECTOMY     & USO  . ANAL FISTULA ECTOMY      X 2, Dr Rosana Hoes  . COLONOSCOPY  2004, 2015   Carlean Purl  . MYOMECTOMY     FOR FIBROIDS, Dr Ubaldo Glassing  . steroid  injection shoulder  05/11/2013   contusion/strain; Dr Onnie Graham   Past Medical History:  Diagnosis Date  . Allergy    RHINITIS  . Anxiety 1989   Dr Jake Michaelis, [place]  . Depression    Dr Toy Care  . Fibroids   . GERD 11/11/2009   Qualifier: Diagnosis of  By: Linna Darner MD, Gwyndolyn Saxon   Dysphagia once monthly on average 4-10/14 ; resolved with Prilosec OTC prn No PMH of endoscopy    . GERD (gastroesophageal reflux disease)   . Hyperlipidemia   . Hypertension     Review of Systems  Constitutional: Negative for chills and fever.  Respiratory: Negative for chest tightness and shortness of breath.   Cardiovascular: Negative for chest pain, palpitations and leg swelling.  Neurological: Negative for weakness and headaches.      Objective:    BP (!) 178/104 (BP Location: Left Arm, Patient Position: Sitting, Cuff Size: Normal)   Pulse 68   Temp 98 F (36.7 C) (Oral)   Resp 16   Ht 5'  7" (1.702 m)   Wt 175 lb 12.8 oz (79.7 kg)   SpO2 97%   BMI 27.53 kg/m  Nursing note and vital signs reviewed.  Physical Exam  Constitutional: She is oriented to person, place, and time. She appears well-developed and well-nourished. No distress.  Cardiovascular: Normal rate, regular rhythm, normal heart sounds and intact distal pulses.  Exam reveals no gallop and no friction rub.   No murmur heard. Pulmonary/Chest: Effort normal and breath sounds normal.  Neurological: She is alert and oriented to person, place, and time.  Skin: Skin is warm and dry.  Psychiatric: She has a normal mood and affect. Her behavior is normal. Judgment and thought content normal.       Assessment & Plan:   Problem List Items Addressed This Visit      Cardiovascular and Mediastinum   Essential hypertension    Blood pressure remains elevated above goal 150/90 with current regimen. No adverse side effects of medication or worse headache of life. No symptoms of end organ damage present on exam. Continue current dosage of hydrochlorothiazide. Start metoprolol for blood pressure as well as heart rate control. Continue to monitor blood pressure at home. Decrease sodium in diet. Follow-up in 2 weeks or sooner if needed.      Relevant Medications   metoprolol succinate (TOPROL-XL) 25 MG 24 hr tablet     Other   Tachycardia - Primary    Transient tachycardia with concern for SVT which resolved with Xanax and relaxation. In office EKG shows normal sinus rhythm with cardiac exam being normal. Obtain BMET to check electrolytes. Given fatigue and increased shortness of breath will obtain cardiac stress test to rule out possible ischemia. Continue to monitor pending results.       Relevant Medications   metoprolol succinate (TOPROL-XL) 25 MG 24 hr tablet   Other Relevant Orders   EKG 12-Lead (Completed)   Exercise Tolerance Test   Basic Metabolic Panel (BMET)    Other Visit Diagnoses   None.      I am  having Ms. Alcock start on metoprolol succinate. I am also having her maintain her estradiol, escitalopram, valACYclovir, ALPRAZolam, aspirin EC, Fish Oil, multivitamin, b complex vitamins, Vitamin D3, Biotin, buPROPion, Probiotic Product (PROBIOTIC PEARLS PO), Garlic, estradiol, Turmeric, Misc Natural Products (COSAMIN ASU ADVANCED FORMULA PO), hydrochlorothiazide, and simvastatin.   Meds ordered this encounter  Medications  . metoprolol succinate (TOPROL-XL) 25 MG 24 hr tablet  Sig: Take 1 tablet (25 mg total) by mouth daily.    Dispense:  30 tablet    Refill:  0    Order Specific Question:   Supervising Provider    Answer:   Pricilla Holm A [id number]     Follow-up: Return in about 2 weeks (around 11/20/2015), or if symptoms worsen or fail to improve.  Mauricio Po, FNP

## 2015-11-07 ENCOUNTER — Telehealth (HOSPITAL_COMMUNITY): Payer: Self-pay

## 2015-11-07 NOTE — Telephone Encounter (Signed)
Encounter complete. 

## 2015-11-11 ENCOUNTER — Telehealth (HOSPITAL_COMMUNITY): Payer: Self-pay

## 2015-11-11 NOTE — Telephone Encounter (Signed)
Pt to call back for any questions or concerns.

## 2015-11-12 ENCOUNTER — Ambulatory Visit (HOSPITAL_COMMUNITY)
Admission: RE | Admit: 2015-11-12 | Discharge: 2015-11-12 | Disposition: A | Discharge status: Home / Self Care | Payer: PPO | Source: Ambulatory Visit | Attending: Family | Admitting: Family

## 2015-11-12 DIAGNOSIS — R000 Tachycardia, unspecified: Secondary | ICD-10-CM | POA: Diagnosis not present

## 2015-11-12 DIAGNOSIS — R9439 Abnormal result of other cardiovascular function study: Secondary | ICD-10-CM | POA: Insufficient documentation

## 2015-11-12 LAB — EXERCISE TOLERANCE TEST
CHL CUP MPHR: 155 {beats}/min
CHL CUP RESTING HR STRESS: 63 {beats}/min
CHL CUP STRESS STAGE 1 DBP: 93 mmHg
CHL CUP STRESS STAGE 2 SPEED: 0.4 mph
CHL CUP STRESS STAGE 3 HR: 65 {beats}/min
CHL CUP STRESS STAGE 3 SPEED: 0.8 mph
CHL CUP STRESS STAGE 4 DBP: 82 mmHg
CHL CUP STRESS STAGE 4 GRADE: 10 %
CHL CUP STRESS STAGE 4 SBP: 132 mmHg
CHL CUP STRESS STAGE 4 SPEED: 1.7 mph
CHL CUP STRESS STAGE 5 HR: 115 {beats}/min
CHL CUP STRESS STAGE 5 SBP: 166 mmHg
CHL CUP STRESS STAGE 6 DBP: 96 mmHg
CHL CUP STRESS STAGE 8 GRADE: 0 %
CHL CUP STRESS STAGE 8 HR: 120 {beats}/min
CHL CUP STRESS STAGE 8 SBP: 120 mmHg
CHL CUP STRESS STAGE 8 SPEED: 0 mph
CHL CUP STRESS STAGE 9 DBP: 85 mmHg
CHL CUP STRESS STAGE 9 GRADE: 0 %
CHL CUP STRESS STAGE 9 HR: 68 {beats}/min
CHL CUP STRESS STAGE 9 SBP: 164 mmHg
CHL RATE OF PERCEIVED EXERTION: 16
CSEPED: 9 min
CSEPEDS: 1 s
CSEPEW: 10.1 METS
CSEPHR: 86 %
CSEPPHR: 131 {beats}/min
Percent of predicted max HR: 84 %
Stage 1 Grade: 0 %
Stage 1 HR: 66 {beats}/min
Stage 1 SBP: 144 mmHg
Stage 1 Speed: 0 mph
Stage 2 Grade: 0 %
Stage 2 HR: 65 {beats}/min
Stage 3 Grade: 0 %
Stage 4 HR: 97 {beats}/min
Stage 5 DBP: 64 mmHg
Stage 5 Grade: 12 %
Stage 5 Speed: 2.5 mph
Stage 6 Grade: 14 %
Stage 6 HR: 133 {beats}/min
Stage 6 SBP: 176 mmHg
Stage 6 Speed: 3.4 mph
Stage 7 Grade: 14.2 %
Stage 7 HR: 131 {beats}/min
Stage 7 Speed: 3.4 mph
Stage 8 DBP: 89 mmHg
Stage 9 Speed: 0 mph

## 2015-12-02 ENCOUNTER — Other Ambulatory Visit: Payer: Self-pay | Admitting: Family

## 2015-12-02 DIAGNOSIS — R000 Tachycardia, unspecified: Secondary | ICD-10-CM

## 2015-12-02 DIAGNOSIS — I10 Essential (primary) hypertension: Secondary | ICD-10-CM

## 2015-12-18 DIAGNOSIS — H524 Presbyopia: Secondary | ICD-10-CM | POA: Diagnosis not present

## 2015-12-18 DIAGNOSIS — H04562 Stenosis of left lacrimal punctum: Secondary | ICD-10-CM | POA: Diagnosis not present

## 2016-01-05 DIAGNOSIS — Z23 Encounter for immunization: Secondary | ICD-10-CM | POA: Diagnosis not present

## 2016-01-05 DIAGNOSIS — L570 Actinic keratosis: Secondary | ICD-10-CM | POA: Diagnosis not present

## 2016-01-22 ENCOUNTER — Encounter: Payer: Self-pay | Admitting: Internal Medicine

## 2016-01-22 ENCOUNTER — Other Ambulatory Visit (INDEPENDENT_AMBULATORY_CARE_PROVIDER_SITE_OTHER): Payer: PPO

## 2016-01-22 ENCOUNTER — Ambulatory Visit (INDEPENDENT_AMBULATORY_CARE_PROVIDER_SITE_OTHER): Payer: PPO | Admitting: Internal Medicine

## 2016-01-22 VITALS — BP 136/90 | HR 64 | Temp 97.7°F | Resp 16 | Ht 67.0 in | Wt 182.0 lb

## 2016-01-22 DIAGNOSIS — I10 Essential (primary) hypertension: Secondary | ICD-10-CM

## 2016-01-22 DIAGNOSIS — Z0000 Encounter for general adult medical examination without abnormal findings: Secondary | ICD-10-CM

## 2016-01-22 DIAGNOSIS — R7301 Impaired fasting glucose: Secondary | ICD-10-CM

## 2016-01-22 DIAGNOSIS — E785 Hyperlipidemia, unspecified: Secondary | ICD-10-CM | POA: Diagnosis not present

## 2016-01-22 DIAGNOSIS — Z1159 Encounter for screening for other viral diseases: Secondary | ICD-10-CM | POA: Diagnosis not present

## 2016-01-22 DIAGNOSIS — Z23 Encounter for immunization: Secondary | ICD-10-CM

## 2016-01-22 DIAGNOSIS — R945 Abnormal results of liver function studies: Secondary | ICD-10-CM | POA: Diagnosis not present

## 2016-01-22 LAB — CBC WITH DIFFERENTIAL/PLATELET
BASOS ABS: 0.1 10*3/uL (ref 0.0–0.1)
Basophils Relative: 0.8 % (ref 0.0–3.0)
EOS PCT: 3.6 % (ref 0.0–5.0)
Eosinophils Absolute: 0.3 10*3/uL (ref 0.0–0.7)
HCT: 39.5 % (ref 36.0–46.0)
HEMOGLOBIN: 13.6 g/dL (ref 12.0–15.0)
LYMPHS ABS: 2.2 10*3/uL (ref 0.7–4.0)
Lymphocytes Relative: 28.7 % (ref 12.0–46.0)
MCHC: 34.3 g/dL (ref 30.0–36.0)
MCV: 94.7 fl (ref 78.0–100.0)
MONO ABS: 0.6 10*3/uL (ref 0.1–1.0)
MONOS PCT: 7.2 % (ref 3.0–12.0)
NEUTROS PCT: 59.7 % (ref 43.0–77.0)
Neutro Abs: 4.6 10*3/uL (ref 1.4–7.7)
Platelets: 195 10*3/uL (ref 150.0–400.0)
RBC: 4.18 Mil/uL (ref 3.87–5.11)
RDW: 13.6 % (ref 11.5–15.5)
WBC: 7.7 10*3/uL (ref 4.0–10.5)

## 2016-01-22 LAB — COMPREHENSIVE METABOLIC PANEL
ALBUMIN: 4.3 g/dL (ref 3.5–5.2)
ALK PHOS: 56 U/L (ref 39–117)
ALT: 36 U/L — AB (ref 0–35)
AST: 33 U/L (ref 0–37)
BILIRUBIN TOTAL: 0.5 mg/dL (ref 0.2–1.2)
BUN: 22 mg/dL (ref 6–23)
CO2: 29 mEq/L (ref 19–32)
CREATININE: 0.75 mg/dL (ref 0.40–1.20)
Calcium: 9.5 mg/dL (ref 8.4–10.5)
Chloride: 101 mEq/L (ref 96–112)
GFR: 82.19 mL/min (ref >60.00)
GLUCOSE: 103 mg/dL — AB (ref 70–99)
Potassium: 4.3 mEq/L (ref 3.5–5.1)
SODIUM: 138 meq/L (ref 135–145)
TOTAL PROTEIN: 7.3 g/dL (ref 6.0–8.3)

## 2016-01-22 LAB — LIPID PANEL
CHOLESTEROL: 192 mg/dL (ref 0–200)
HDL: 53.1 mg/dL (ref >39.00)
LDL Cholesterol: 109 mg/dL — ABNORMAL HIGH (ref 0–99)
NONHDL: 138.68
Total CHOL/HDL Ratio: 4
Triglycerides: 148 mg/dL (ref 0.0–149.0)
VLDL: 29.6 mg/dL (ref 0.0–40.0)

## 2016-01-22 LAB — HEMOGLOBIN A1C: HEMOGLOBIN A1C: 5.6 % (ref 4.6–6.5)

## 2016-01-22 LAB — TSH: TSH: 2.42 u[IU]/mL (ref 0.35–4.50)

## 2016-01-22 MED ORDER — SIMVASTATIN 40 MG PO TABS: 40.0000 mg | ORAL_TABLET | Freq: Every day | ORAL | 3 refills | Status: DC

## 2016-01-22 MED ORDER — HYDROCHLOROTHIAZIDE 25 MG PO TABS: 25.0000 mg | ORAL_TABLET | Freq: Every day | ORAL | 3 refills | Status: DC

## 2016-01-22 MED ORDER — METOPROLOL SUCCINATE ER 25 MG PO TB24: 25.0000 mg | ORAL_TABLET | Freq: Every day | ORAL | 3 refills | Status: DC

## 2016-01-22 NOTE — Patient Instructions (Addendum)
Test(s) ordered today. Your results will be released to MyChart (or called to you) after review, usually within 72hours after test completion. If any changes need to be made, you will be notified at that same time.  All other Health Maintenance issues reviewed.   All recommended immunizations and age-appropriate screenings are up-to-date or discussed.  Flu immunization administered today.   Medications reviewed and updated.  No changes recommended at this time.  Your prescription(s) have been submitted to your pharmacy. Please take as directed and contact our office if you believe you are having problem(s) with the medication(s).   Please followup in one year for a physical exam    Health Maintenance, Female Adopting a healthy lifestyle and getting preventive care can go a long way to promote health and wellness. Talk with your health care provider about what schedule of regular examinations is right for you. This is a good chance for you to check in with your provider about disease prevention and staying healthy. In between checkups, there are plenty of things you can do on your own. Experts have done a lot of research about which lifestyle changes and preventive measures are most likely to keep you healthy. Ask your health care provider for more information. WEIGHT AND DIET  Eat a healthy diet  Be sure to include plenty of vegetables, fruits, low-fat dairy products, and lean protein.  Do not eat a lot of foods high in solid fats, added sugars, or salt.  Get regular exercise. This is one of the most important things you can do for your health.  Most adults should exercise for at least 150 minutes each week. The exercise should increase your heart rate and make you sweat (moderate-intensity exercise).  Most adults should also do strengthening exercises at least twice a week. This is in addition to the moderate-intensity exercise.  Maintain a healthy weight  Body mass index (BMI) is  a measurement that can be used to identify possible weight problems. It estimates body fat based on height and weight. Your health care provider can help determine your BMI and help you achieve or maintain a healthy weight.  For females 20 years of age and older:   A BMI below 18.5 is considered underweight.  A BMI of 18.5 to 24.9 is normal.  A BMI of 25 to 29.9 is considered overweight.  A BMI of 30 and above is considered obese.  Watch levels of cholesterol and blood lipids  You should start having your blood tested for lipids and cholesterol at 66 years of age, then have this test every 5 years.  You may need to have your cholesterol levels checked more often if:  Your lipid or cholesterol levels are high.  You are older than 66 years of age.  You are at high risk for heart disease.  CANCER SCREENING   Lung Cancer  Lung cancer screening is recommended for adults 55-80 years old who are at high risk for lung cancer because of a history of smoking.  A yearly low-dose CT scan of the lungs is recommended for people who:  Currently smoke.  Have quit within the past 15 years.  Have at least a 30-pack-year history of smoking. A pack year is smoking an average of one pack of cigarettes a day for 1 year.  Yearly screening should continue until it has been 15 years since you quit.  Yearly screening should stop if you develop a health problem that would prevent you from having lung   cancer treatment.  Breast Cancer  Practice breast self-awareness. This means understanding how your breasts normally appear and feel.  It also means doing regular breast self-exams. Let your health care provider know about any changes, no matter how small.  If you are in your 20s or 30s, you should have a clinical breast exam (CBE) by a health care provider every 1-3 years as part of a regular health exam.  If you are 29 or older, have a CBE every year. Also consider having a breast X-ray  (mammogram) every year.  If you have a family history of breast cancer, talk to your health care provider about genetic screening.  If you are at high risk for breast cancer, talk to your health care provider about having an MRI and a mammogram every year.  Breast cancer gene (BRCA) assessment is recommended for women who have family members with BRCA-related cancers. BRCA-related cancers include:  Breast.  Ovarian.  Tubal.  Peritoneal cancers.  Results of the assessment will determine the need for genetic counseling and BRCA1 and BRCA2 testing. Cervical Cancer Your health care provider may recommend that you be screened regularly for cancer of the pelvic organs (ovaries, uterus, and vagina). This screening involves a pelvic examination, including checking for microscopic changes to the surface of your cervix (Pap test). You may be encouraged to have this screening done every 3 years, beginning at age 39.  For women ages 53-65, health care providers may recommend pelvic exams and Pap testing every 3 years, or they may recommend the Pap and pelvic exam, combined with testing for human papilloma virus (HPV), every 5 years. Some types of HPV increase your risk of cervical cancer. Testing for HPV may also be done on women of any age with unclear Pap test results.  Other health care providers may not recommend any screening for nonpregnant women who are considered low risk for pelvic cancer and who do not have symptoms. Ask your health care provider if a screening pelvic exam is right for you.  If you have had past treatment for cervical cancer or a condition that could lead to cancer, you need Pap tests and screening for cancer for at least 20 years after your treatment. If Pap tests have been discontinued, your risk factors (such as having a new sexual partner) need to be reassessed to determine if screening should resume. Some women have medical problems that increase the chance of getting  cervical cancer. In these cases, your health care provider may recommend more frequent screening and Pap tests. Colorectal Cancer  This type of cancer can be detected and often prevented.  Routine colorectal cancer screening usually begins at 66 years of age and continues through 66 years of age.  Your health care provider may recommend screening at an earlier age if you have risk factors for colon cancer.  Your health care provider may also recommend using home test kits to check for hidden blood in the stool.  A small camera at the end of a tube can be used to examine your colon directly (sigmoidoscopy or colonoscopy). This is done to check for the earliest forms of colorectal cancer.  Routine screening usually begins at age 18.  Direct examination of the colon should be repeated every 5-10 years through 66 years of age. However, you may need to be screened more often if early forms of precancerous polyps or small growths are found. Skin Cancer  Check your skin from head to toe regularly.  Tell  your health care provider about any new moles or changes in moles, especially if there is a change in a mole's shape or color.  Also tell your health care provider if you have a mole that is larger than the size of a pencil eraser.  Always use sunscreen. Apply sunscreen liberally and repeatedly throughout the day.  Protect yourself by wearing long sleeves, pants, a wide-brimmed hat, and sunglasses whenever you are outside. HEART DISEASE, DIABETES, AND HIGH BLOOD PRESSURE   High blood pressure causes heart disease and increases the risk of stroke. High blood pressure is more likely to develop in:  People who have blood pressure in the high end of the normal range (130-139/85-89 mm Hg).  People who are overweight or obese.  People who are African American.  If you are 31-56 years of age, have your blood pressure checked every 3-5 years. If you are 59 years of age or older, have your blood  pressure checked every year. You should have your blood pressure measured twice--once when you are at a hospital or clinic, and once when you are not at a hospital or clinic. Record the average of the two measurements. To check your blood pressure when you are not at a hospital or clinic, you can use:  An automated blood pressure machine at a pharmacy.  A home blood pressure monitor.  If you are between 58 years and 12 years old, ask your health care provider if you should take aspirin to prevent strokes.  Have regular diabetes screenings. This involves taking a blood sample to check your fasting blood sugar level.  If you are at a normal weight and have a low risk for diabetes, have this test once every three years after 66 years of age.  If you are overweight and have a high risk for diabetes, consider being tested at a younger age or more often. PREVENTING INFECTION  Hepatitis B  If you have a higher risk for hepatitis B, you should be screened for this virus. You are considered at high risk for hepatitis B if:  You were born in a country where hepatitis B is common. Ask your health care provider which countries are considered high risk.  Your parents were born in a high-risk country, and you have not been immunized against hepatitis B (hepatitis B vaccine).  You have HIV or AIDS.  You use needles to inject street drugs.  You live with someone who has hepatitis B.  You have had sex with someone who has hepatitis B.  You get hemodialysis treatment.  You take certain medicines for conditions, including cancer, organ transplantation, and autoimmune conditions. Hepatitis C  Blood testing is recommended for:  Everyone born from [birth year] through 1966.  Anyone with known risk factors for hepatitis C. Sexually transmitted infections (STIs)  You should be screened for sexually transmitted infections (STIs) including gonorrhea and chlamydia if:  You are sexually active and are  younger than 66 years of age.  You are older than 66 years of age and your health care provider tells you that you are at risk for this type of infection.  Your sexual activity has changed since you were last screened and you are at an increased risk for chlamydia or gonorrhea. Ask your health care provider if you are at risk.  If you do not have HIV, but are at risk, it may be recommended that you take a prescription medicine daily to prevent HIV infection. This is called pre-exposure prophylaxis (  PrEP). You are considered at risk if:  You are sexually active and do not regularly use condoms or know the HIV status of your partner(s).  You take drugs by injection.  You are sexually active with a partner who has HIV. Talk with your health care provider about whether you are at high risk of being infected with HIV. If you choose to begin PrEP, you should first be tested for HIV. You should then be tested every 3 months for as long as you are taking PrEP.  PREGNANCY   If you are premenopausal and you may become pregnant, ask your health care provider about preconception counseling.  If you may become pregnant, take 400 to 800 micrograms (mcg) of folic acid every day.  If you want to prevent pregnancy, talk to your health care provider about birth control (contraception). OSTEOPOROSIS AND MENOPAUSE   Osteoporosis is a disease in which the bones lose minerals and strength with aging. This can result in serious bone fractures. Your risk for osteoporosis can be identified using a bone density scan.  If you are 65 years of age or older, or if you are at risk for osteoporosis and fractures, ask your health care provider if you should be screened.  Ask your health care provider whether you should take a calcium or vitamin D supplement to lower your risk for osteoporosis.  Menopause may have certain physical symptoms and risks.  Hormone replacement therapy may reduce some of these symptoms and  risks. Talk to your health care provider about whether hormone replacement therapy is right for you.  HOME CARE INSTRUCTIONS   Schedule regular health, dental, and eye exams.  Stay current with your immunizations.   Do not use any tobacco products including cigarettes, chewing tobacco, or electronic cigarettes.  If you are pregnant, do not drink alcohol.  If you are breastfeeding, limit how much and how often you drink alcohol.  Limit alcohol intake to no more than 1 drink per day for nonpregnant women. One drink equals 12 ounces of beer, 5 ounces of wine, or 1 ounces of hard liquor.  Do not use street drugs.  Do not share needles.  Ask your health care provider for help if you need support or information about quitting drugs.  Tell your health care provider if you often feel depressed.  Tell your health care provider if you have ever been abused or do not feel safe at home.   This information is not intended to replace advice given to you by your health care provider. Make sure you discuss any questions you have with your health care provider.   Document Released: 09/28/2010 Document Revised: 04/05/2014 Document Reviewed: 02/14/2013 Elsevier Interactive Patient Education 2016 [employer] Inc.  

## 2016-01-22 NOTE — Assessment & Plan Note (Signed)
Controlled with metoprolol - work up negative for anything concerning -  Stress test 11/12/15 - no ischemia

## 2016-01-22 NOTE — Assessment & Plan Note (Signed)
Will check a1c

## 2016-01-22 NOTE — Assessment & Plan Note (Signed)
Continue simvastatin Check lipid,cmp

## 2016-01-22 NOTE — Progress Notes (Signed)
Subjective:    Patient ID: Jasmine Bray, female    DOB: Oct 31, 1950, 65 y.o.   MRN: [id number]  HPI She is here for a physical exam.   Hypertension: She is taking her medication daily. She is compliant with a low sodium diet.  She denies chest pain, palpitations, edema, shortness of breath and regular headaches. She is exercising regularly.  She does not monitor her blood pressure at home, 140-low 150's/80's - on average less than 150/90.    Hyperlipidemia: She is taking her medication daily. She is compliant with a low fat/cholesterol diet. She is exercising regularly. She denies myalgias.   Her anxiety and depression are well controlled.   She is following with psychiatry.    Medications and allergies reviewed with patient and updated if appropriate.  Patient Active Problem List   Diagnosis Date Noted  . Tachycardia 11/06/2015  . Right shoulder pain 04/24/2013  . Fasting hyperglycemia 01/10/2012  . Hyperlipidemia 11/11/2009  . 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 Essential hypertension 11/11/2009    Current Outpatient Prescriptions on File Prior to Visit  Medication Sig Dispense Refill  . ALPRAZolam (XANAX) 1 MG tablet Take 1 mg by mouth as needed.    Marland Kitchen aspirin EC 81 MG tablet Take 81 mg by mouth daily.    Marland Kitchen b complex vitamins tablet Take 1 tablet by mouth daily.    . Biotin 1 MG CAPS Take 1 tablet by mouth daily.    Marland Kitchen buPROPion (WELLBUTRIN XL) 150 MG 24 hr tablet Take 150 mg by mouth daily.    . Cholecalciferol (VITAMIN D3) 3000 UNITS TABS Take 1 capsule by mouth daily. Taking 1000 units daily    . escitalopram (LEXAPRO) 20 MG tablet Take 10 mg by mouth daily.     Marland Kitchen estradiol (ESTRACE) 0.1 MG/GM vaginal cream Place 2 g vaginally. 1 GRAM 3x A WEEK     . estradiol (EVAMIST) 1.53 MG/SPRAY transdermal spray Place 1 spray onto the skin daily.    . Garlic 123XX123 MG CAPS Take 1 capsule by mouth daily.    . hydrochlorothiazide (HYDRODIURIL) 25 MG tablet Take 1 tablet (25  mg total) by mouth daily. 90 tablet 3  . metoprolol succinate (TOPROL-XL) 25 MG 24 hr tablet TAKE 1 TABLET BY MOUTH DAILY 30 tablet 5  . Misc Natural Products (COSAMIN ASU ADVANCED FORMULA PO) Take by mouth 2 (two) times daily.    . Multiple Vitamin (MULTIVITAMIN) tablet Take 1 tablet by mouth daily.    . Omega-3 Fatty Acids (FISH OIL) 1000 MG CAPS Take by mouth daily.    . Probiotic Product (PROBIOTIC PEARLS PO) Take 1 tablet by mouth daily.    . simvastatin (ZOCOR) 40 MG tablet Take 1 tablet (40 mg total) by mouth at bedtime. 90 tablet 3  . Turmeric 500 MG CAPS Take 500 mg by mouth daily.    . valACYclovir (VALTREX) 1000 MG tablet Take 1,000 mg by mouth. 1/2 by mouth once daily     No current facility-administered medications on file prior to visit.     Past Medical History:  Diagnosis Date  . Allergy    RHINITIS  . Anxiety 1989   Dr Jake Michaelis, [place]  . Depression    Dr Toy Care  . Fibroids   . GERD 11/11/2009   Qualifier: Diagnosis of  By: Linna Darner MD, Gwyndolyn Saxon   Dysphagia once monthly on average 4-10/14 ; resolved with Prilosec OTC prn No PMH of endoscopy    .  GERD (gastroesophageal reflux disease)   . Hyperlipidemia   . Hypertension     Past Surgical History:  Procedure Laterality Date  . ABDOMINAL HYSTERECTOMY     & USO  . ANAL FISTULA ECTOMY      X 2, Dr Rosana Hoes  . COLONOSCOPY  2004, 2015   Carlean Purl  . MYOMECTOMY     FOR FIBROIDS, Dr Ubaldo Glassing  . steroid injection shoulder  05/11/2013   contusion/strain; Dr Onnie Graham    Social History   Social History  . Marital status: Divorced    Spouse name: N/A  . Number of children: N/A  . Years of education: N/A   Occupational History  . SALES    Social History Main Topics  . Smoking status: Never Smoker  . Smokeless tobacco: Never Used  . Alcohol use Yes     Comment:  1-2 / day  . Drug use: No  . Sexual activity: Not on file   Other Topics Concern  . Not on file   Social History Narrative   GETS REG EXERCISE          Family  History  Problem Relation Age of Onset  . Arthritis Mother   . Stroke Mother 98  . Alcohol abuse Father   . Hyperlipidemia Sister   . Hypertension Sister   . Atrial fibrillation Sister   . Breast cancer Paternal Aunt   . Diabetes Maternal Grandmother   . Heart disease Paternal Grandmother     CAD; no MI  . Stroke Paternal Grandmother     in 67s  . Stroke Paternal Grandfather     in late 67s  . Colon cancer Neg Hx     Review of Systems  Constitutional: Negative for appetite change, chills, fatigue and fever.  HENT: Negative for hearing loss.   Eyes: Negative for visual disturbance.  Respiratory: Negative for cough, shortness of breath and wheezing.   Cardiovascular: Negative for chest pain, palpitations and leg swelling.  Gastrointestinal: Negative for abdominal pain, blood in stool, constipation, diarrhea and nausea.       Rare gerd  Genitourinary: Negative for dysuria and hematuria.  Musculoskeletal: Negative for arthralgias, back pain and myalgias.  Neurological: Positive for light-headedness (with changes in position - occasional). Negative for dizziness and headaches.  Psychiatric/Behavioral: Positive for dysphoric mood. Negative for sleep disturbance. The patient is nervous/anxious (controlled).        Objective:   Vitals:   01/22/16 0943  BP: 136/90  Pulse: 64  Resp: 16  Temp: 97.7 F (36.5 C)   Filed Weights   01/22/16 0943  Weight: 182 lb (82.6 kg)   Body mass index is 28.51 kg/m.   Physical Exam Constitutional: She appears well-developed and well-nourished. No distress.  HENT:  Head: Normocephalic and atraumatic.  Right Ear: External ear normal. Normal ear canal and TM Left Ear: External ear normal.  Normal ear canal and TM Mouth/Throat: Oropharynx is clear and moist.  Eyes: Conjunctivae and EOM are normal.  Neck: Neck supple. No tracheal deviation present. No thyromegaly present.  No carotid bruit  Cardiovascular: Normal rate, regular rhythm and  normal heart sounds.   No murmur heard.  No edema. Pulmonary/Chest: Effort normal and breath sounds normal. No respiratory distress. She has no wheezes. She has no rales.  Breast: deferred to Gyn Abdominal: Soft. She exhibits no distension. There is no tenderness.  Lymphadenopathy: She has no cervical adenopathy.  Skin: Skin is warm and dry. She is not diaphoretic.  Psychiatric:  She has a normal mood and affect. Her behavior is normal.      Assessment & Plan:    Physical exam: Screening blood work  ordered Immunizations  Flu vaccine today, discussed prevnar - will come back in 4 weeks for it Colonoscopy   Up to date  Mammogram  Up to date  Gyn  Up to date  Dexa - will do next year - has had one years ago and it was normal.  Active and on estrogen Eye exams  Up to date  EKG - done 10/2015 Exercise - exercising regularly Weight - has gained work - will work on [employer] loss Skin  - full body scan by derm within the past year Substance abuse - none  See Problem List for Assessment and Plan of chronic medical problems.

## 2016-01-22 NOTE — Progress Notes (Signed)
Pre visit review using our clinic review tool, if applicable. No additional management support is needed unless otherwise documented below in the visit note. 

## 2016-01-22 NOTE — Assessment & Plan Note (Addendum)
BP well controlled - on average less than 150/90 Current regimen effective and well tolerated Continue current medications at current doses cmp

## 2016-01-23 LAB — HEPATITIS C ANTIBODY: HCV AB: REACTIVE — AB

## 2016-01-23 LAB — HIV ANTIBODY (ROUTINE TESTING W REFLEX): HIV: NONREACTIVE

## 2016-01-27 LAB — HEPATITIS C RNA QUANTITATIVE: HCV QUANT: NOT DETECTED [IU]/mL (ref <15)

## 2016-01-29 ENCOUNTER — Encounter: Payer: Self-pay | Admitting: Internal Medicine

## 2016-01-29 DIAGNOSIS — R768 Other specified abnormal immunological findings in serum: Secondary | ICD-10-CM | POA: Insufficient documentation

## 2016-02-03 ENCOUNTER — Telehealth: Payer: Self-pay | Admitting: *Deleted

## 2016-02-03 DIAGNOSIS — I10 Essential (primary) hypertension: Secondary | ICD-10-CM

## 2016-02-03 MED ORDER — HYDROCHLOROTHIAZIDE 25 MG PO TABS: 25.0000 mg | ORAL_TABLET | Freq: Every day | ORAL | 0 refills | Status: DC

## 2016-02-03 MED ORDER — SIMVASTATIN 40 MG PO TABS: 40.0000 mg | ORAL_TABLET | Freq: Every day | ORAL | 3 refills | Status: DC

## 2016-02-03 MED ORDER — HYDROCHLOROTHIAZIDE 25 MG PO TABS: 25.0000 mg | ORAL_TABLET | Freq: Every day | ORAL | 3 refills | Status: DC

## 2016-02-03 MED ORDER — METOPROLOL SUCCINATE ER 25 MG PO TB24: 25.0000 mg | ORAL_TABLET | Freq: Every day | ORAL | 3 refills | Status: DC

## 2016-02-03 NOTE — Telephone Encounter (Signed)
Left msg on triage stating saw MD couple weeks ago for cpx still have not receive refills from mail service. Called pt back verified which mail service she is using. Pt states she inform nurse she uses [facility] since insurance has changed. Inform pt per chart refills was sent to [facility]. Will resend to [facility]. She states she only have 4 pills left on her HCTZ will need short supply sen to local pharmacy. Inform pt will send to [facility]

## 2016-02-14 ENCOUNTER — Encounter: Payer: Self-pay | Admitting: Internal Medicine

## 2016-02-24 ENCOUNTER — Ambulatory Visit: Payer: PPO

## 2016-03-01 ENCOUNTER — Ambulatory Visit: Payer: PPO

## 2016-03-08 ENCOUNTER — Ambulatory Visit (INDEPENDENT_AMBULATORY_CARE_PROVIDER_SITE_OTHER): Payer: PPO

## 2016-03-08 DIAGNOSIS — Z23 Encounter for immunization: Secondary | ICD-10-CM

## 2016-03-17 ENCOUNTER — Ambulatory Visit (INDEPENDENT_AMBULATORY_CARE_PROVIDER_SITE_OTHER): Payer: PPO | Admitting: Internal Medicine

## 2016-03-17 ENCOUNTER — Encounter: Payer: Self-pay | Admitting: Internal Medicine

## 2016-03-17 ENCOUNTER — Telehealth: Payer: Self-pay | Admitting: Internal Medicine

## 2016-03-17 DIAGNOSIS — M79622 Pain in left upper arm: Secondary | ICD-10-CM | POA: Diagnosis not present

## 2016-03-17 MED ORDER — AZITHROMYCIN 250 MG PO TABS: ORAL_TABLET | ORAL | 1 refills | Status: DC

## 2016-03-17 MED ORDER — LIDOCAINE 5 % EX PTCH: 1.0000 | MEDICATED_PATCH | CUTANEOUS | 0 refills | Status: DC

## 2016-03-17 MED ORDER — DICLOFENAC SODIUM 1 % TD GEL: 4.0000 g | Freq: Four times a day (QID) | TRANSDERMAL | 1 refills | Status: DC | PRN

## 2016-03-17 NOTE — Progress Notes (Signed)
Subjective:    Patient ID: Jasmine Bray, female    DOB: 06-22-1950, 65 y.o.   MRN: [id number]  HPI  Here to f/u with c/o 9 days onset mild to mod but persistent left deltoid pain and localized area of swelling, sharp, tender to touch but no fever or skin redness.  Pt is unaware but appears to have some bruising to the skin.  No red streaks, drainage.  No prior hx of similar or allergy rxn.  Denies itching or swelling, has not tried benadryl, but advil does not help enough with pain o swelling.  Pt denies chest pain, increased sob or doe, wheezing, orthopnea, PND, increased LE swelling, palpitations, dizziness or syncope.   Pt denies polydipsia, polyuria  No other new history Past Medical History:  Diagnosis Date  . Allergy    RHINITIS  . Anxiety 1989   Dr Jake Michaelis, [place]  . Depression    Dr Toy Care  . Fibroids   . GERD 11/11/2009   Qualifier: Diagnosis of  By: Linna Darner MD, Gwyndolyn Saxon   Dysphagia once monthly on average 4-10/14 ; resolved with Prilosec OTC prn No PMH of endoscopy    . GERD (gastroesophageal reflux disease)   . Hyperlipidemia   . Hypertension    Past Surgical History:  Procedure Laterality Date  . ABDOMINAL HYSTERECTOMY     & USO  . ANAL FISTULA ECTOMY      X 2, Dr Rosana Hoes  . COLONOSCOPY  2004, 2015   Carlean Purl  . MYOMECTOMY     FOR FIBROIDS, Dr Ubaldo Glassing  . steroid injection shoulder  05/11/2013   contusion/strain; Dr Onnie Graham    reports that she has never smoked. She has never used smokeless tobacco. She reports that she drinks alcohol. She reports that she does not use drugs. family history includes Alcohol abuse in her father; Arthritis in her mother; Atrial fibrillation in her sister; Breast cancer in her paternal aunt; Diabetes in her maternal grandmother; Heart disease in her paternal grandmother; Hyperlipidemia in her sister; Hypertension in her sister; Stroke in her paternal grandfather and paternal grandmother; Stroke (age of onset: 45) in her mother. No Known  Allergies Current Outpatient Prescriptions on File Prior to Visit  Medication Sig Dispense Refill  . ALPRAZolam (XANAX) 1 MG tablet Take 1 mg by mouth as needed.    Marland Kitchen aspirin EC 81 MG tablet Take 81 mg by mouth daily.    Marland Kitchen b complex vitamins tablet Take 1 tablet by mouth daily.    . Biotin 1 MG CAPS Take 1 tablet by mouth daily.    Marland Kitchen buPROPion (WELLBUTRIN XL) 150 MG 24 hr tablet Take 150 mg by mouth daily.    . Cholecalciferol (VITAMIN D3) 3000 UNITS TABS Take 1 capsule by mouth daily. Taking 1000 units daily    . escitalopram (LEXAPRO) 20 MG tablet Take 10 mg by mouth daily.     Marland Kitchen estradiol (ESTRACE) 0.1 MG/GM vaginal cream Place 2 g vaginally. 1 GRAM 3x A WEEK     . estradiol (EVAMIST) 1.53 MG/SPRAY transdermal spray Place 1 spray onto the skin daily.    . Garlic 123XX123 MG CAPS Take 1 capsule by mouth daily.    . hydrochlorothiazide (HYDRODIURIL) 25 MG tablet Take 1 tablet (25 mg total) by mouth daily. 14 tablet 0  . hydrochlorothiazide (HYDRODIURIL) 25 MG tablet Take 1 tablet (25 mg total) by mouth daily. 90 tablet 3  . metoprolol succinate (TOPROL-XL) 25 MG 24 hr tablet Take 1 tablet (25  mg total) by mouth daily. 90 tablet 3  . Misc Natural Products (COSAMIN ASU ADVANCED FORMULA PO) Take by mouth 2 (two) times daily.    . Multiple Vitamin (MULTIVITAMIN) tablet Take 1 tablet by mouth daily.    . Omega-3 Fatty Acids (FISH OIL) 1000 MG CAPS Take by mouth daily.    . Probiotic Product (PROBIOTIC PEARLS PO) Take 1 tablet by mouth daily.    . simvastatin (ZOCOR) 40 MG tablet Take 1 tablet (40 mg total) by mouth at bedtime. 90 tablet 3  . Turmeric 500 MG CAPS Take 500 mg by mouth daily.    . valACYclovir (VALTREX) 1000 MG tablet Take 1,000 mg by mouth. 1/2 by mouth once daily     No current facility-administered medications on file prior to visit.    Review of Systems  All other system neg per pt    Objective:   Physical Exam BP 130/80   Pulse 85   Temp 98 F (36.7 C) (Oral)   Resp 20    SpO2 96%  VS noted,  Constitutional: Pt appears in no apparent distress HENT: Head: NCAT.  Right Ear: External ear normal.  Left Ear: External ear normal.  Eyes: . Pupils are equal, round, and reactive to light. Conjunctivae and EOM are normal Neck: Normal range of motion. Neck supple.  Cardiovascular: Normal rate and regular rhythm.   Pulmonary/Chest: Effort normal and breath sounds without rales or wheezing.  Neurological: Pt is alert. Not confused , motor grossly intact Skin: left deltoid mid to slightly distal area with 1.5 cm area visible overlying swelling and slight bruising/yellow/green with firm mass like tenderness underlying, without fluctuance or drainage, no wound or ulceration noted Psychiatric: Pt behavior is normal. No agitation.     Assessment & Plan:

## 2016-03-17 NOTE — Telephone Encounter (Signed)
Patient Name: [facility] Maurer DOB: 02/17/1951 Initial Comment Caller states c/o arm pain, radiating up to her shoulder and down to her hand. She had a pneumonia shot 9 days ago and still has knot at injection site also. Nurse Assessment Nurse: Marcelline Deist, RN, Lynda Date/Time (Eastern Time): 03/17/2016 10:32:01 AM Confirm and document reason for call. If symptomatic, describe symptoms. ---Caller states c/o left arm pain, radiating up to her shoulder and down to her hand. She had a pneumonia shot 9 days ago and still has knot & swelling at injection site also. Has been uncomfortable with it, the radiating pain has been for past couple days. Has tried 800 mg of Ibuprofen. Pain has been enough to make her cry, can't sleep. Does the patient have any new or worsening symptoms? ---Yes Will a triage be completed? ---Yes Related visit to physician within the last 2 weeks? ---Yes Does the PT have any chronic conditions? (i.e. diabetes, asthma, etc.) ---Yes List chronic conditions. ---on BP, cholesterol rx, antidepressants Is this a behavioral health or substance abuse call? ---No Guidelines Guideline Title Affirmed Question Affirmed Notes Immunization Reactions [1] Over 3 days (72 hours) since shot AND [2] redness, swelling or pain getting worse Final Disposition User See Physician within [place], RN, [employer] states she can not do her normal activities such as cleaning horse stalls, throwing hay, or even lying on the arm. Referrals REFERRED TO PCP OFFICE Disagree/Comply: Comply

## 2016-03-17 NOTE — Assessment & Plan Note (Addendum)
S/p injection x 9 days with local site persistent firm tenderness without obvious infection; does have faint overlying bruising and I suspect prime differential is traumatic local hematoma, though cant completely r/o infection (though much less liekly) and very much doubt allergic type reaction.  D/w pt the nature and natural history, and the expectation that any hematoma will take at least 3-5 more weeks to resolve completely; also gave zpack for smaller chance of  Also for local pain control with lidoderm patch (and volt gel if patch not affordable), to cont to monitor,  to f/u any worsening symptoms or concerns

## 2016-03-17 NOTE — Patient Instructions (Signed)
Please take all new medication as prescribed - the lidoderm patch, voltaren gel as needed, and antibiotic  Please continue all other medications as before, and refills have been done if requested.  Please have the pharmacy call with any other refills you may need.  Please keep your appointments with your specialists as you may have planned

## 2016-03-17 NOTE — Progress Notes (Signed)
Pre visit review using our clinic review tool, if applicable. No additional management support is needed unless otherwise documented below in the visit note. 

## 2016-05-28 ENCOUNTER — Telehealth: Payer: Self-pay | Admitting: Internal Medicine

## 2016-05-28 MED ORDER — SIMVASTATIN 40 MG PO TABS: 40.0000 mg | ORAL_TABLET | Freq: Every day | ORAL | 0 refills | Status: DC

## 2016-05-28 NOTE — Telephone Encounter (Signed)
Called pt no answer LMOM can send over 10 day supply until she receive mail order. Sent rx to [facility].../lmb

## 2016-05-28 NOTE — Telephone Encounter (Signed)
Pt called in and said that she will be out of her cholesterol meds for 10 days before she can get hers in the mail.  It is safe to be off for that many days?

## 2016-06-11 DIAGNOSIS — F4323 Adjustment disorder with mixed anxiety and depressed mood: Secondary | ICD-10-CM | POA: Diagnosis not present

## 2016-10-21 ENCOUNTER — Ambulatory Visit (INDEPENDENT_AMBULATORY_CARE_PROVIDER_SITE_OTHER): Payer: PPO | Admitting: *Deleted

## 2016-10-21 VITALS — BP 138/82 | HR 64 | Resp 20 | Ht 67.0 in | Wt 182.0 lb

## 2016-10-21 DIAGNOSIS — L814 Other melanin hyperpigmentation: Secondary | ICD-10-CM | POA: Diagnosis not present

## 2016-10-21 DIAGNOSIS — Z0000 Encounter for general adult medical examination without abnormal findings: Secondary | ICD-10-CM

## 2016-10-21 DIAGNOSIS — D485 Neoplasm of uncertain behavior of skin: Secondary | ICD-10-CM | POA: Diagnosis not present

## 2016-10-21 DIAGNOSIS — L821 Other seborrheic keratosis: Secondary | ICD-10-CM | POA: Diagnosis not present

## 2016-10-21 DIAGNOSIS — D2221 Melanocytic nevi of right ear and external auricular canal: Secondary | ICD-10-CM | POA: Diagnosis not present

## 2016-10-21 DIAGNOSIS — L570 Actinic keratosis: Secondary | ICD-10-CM | POA: Diagnosis not present

## 2016-10-21 DIAGNOSIS — L810 Postinflammatory hyperpigmentation: Secondary | ICD-10-CM | POA: Diagnosis not present

## 2016-10-21 NOTE — Patient Instructions (Addendum)
Please ask OBYGN to send records to Dr. Quay Burow  Continue doing brain stimulating activities (puzzles, reading, adult coloring books, staying active) to keep memory sharp.   Continue to eat heart healthy diet (full of fruits, vegetables, whole grains, lean protein, water--limit salt, fat, and sugar intake) and increase physical activity as tolerated.   Ms. Jasmine Bray , Thank you for taking time to come for your Medicare Wellness Visit. I appreciate your ongoing commitment to your health goals. Please review the following plan we discussed and let me know if I can assist you in the future.   These are the goals we discussed: Goals    . Stay as healthy as possible           Continue to eat healthy, exercise, and enjoy life       This is a list of the screening recommended for you and due dates:  Health Maintenance  Topic Date Due  . Mammogram  06/28/2014  . DEXA scan (bone density measurement)  02/13/2015  . Flu Shot  10/27/2016  . Pneumonia vaccines (2 of 2 - PPSV23) 03/08/2017  . Tetanus Vaccine  11/12/2019  . Colon Cancer Screening  09/26/2023  .  Hepatitis C: One time screening is recommended by Center for Disease Control  (CDC) for  adults born from [birth year] through 1966.   Completed

## 2016-10-21 NOTE — Progress Notes (Signed)
Pre visit review using our clinic review tool, if applicable. No additional management support is needed unless otherwise documented below in the visit note. 

## 2016-10-21 NOTE — Progress Notes (Addendum)
Subjective:   Jasmine Bray is a 66 y.o. female who presents for an Initial Medicare Annual Wellness Visit.  Review of Systems    No ROS.  Medicare Wellness Visit. Additional risk factors are reflected in the social history.     Sleep patterns: feels rested on waking, gets up 1 times nightly to void and sleeps 7-8 hours nightly.    Home Safety/Smoke Alarms: Feels safe in home. Smoke alarms in place.  Living environment; residence and Firearm Safety: 1-story house/ trailer, no firearms. Lives alone, no needs for DME, good support system  Seat Belt Safety/Bike Helmet: Wears seat belt.   Counseling:   Eye Exam- appointment yearly Dental- appointment every 6 months   Female:   Pap- N/A   Mammo- Last 06/27/12,  BI-RADS category 1: negative, upcoming appointment 11/10/16, patient will request results to be sent to PCP     Dexa scan- N/D       CCS- Last 09/25/13, recall 10 years     Objective:    There were no vitals filed for this visit. There is no height or weight on file to calculate BMI.   Current Medications (verified) Outpatient Encounter Prescriptions as of 10/21/2016  Medication Sig  . ALPRAZolam (XANAX) 1 MG tablet Take 1 mg by mouth as needed.  Marland Kitchen aspirin EC 81 MG tablet Take 81 mg by mouth daily.  Marland Kitchen azithromycin (ZITHROMAX Z-PAK) 250 MG tablet 2 tab by mouth day 1, then 1 per day  . b complex vitamins tablet Take 1 tablet by mouth daily.  . Biotin 1 MG CAPS Take 1 tablet by mouth daily.  Marland Kitchen buPROPion (WELLBUTRIN XL) 150 MG 24 hr tablet Take 150 mg by mouth daily.  . Cholecalciferol (VITAMIN D3) 3000 UNITS TABS Take 1 capsule by mouth daily. Taking 1000 units daily  . diclofenac sodium (VOLTAREN) 1 % GEL Apply 4 g topically 4 (four) times daily as needed.  Marland Kitchen escitalopram (LEXAPRO) 20 MG tablet Take 10 mg by mouth daily.   Marland Kitchen estradiol (ESTRACE) 0.1 MG/GM vaginal cream Place 2 g vaginally. 1 GRAM 3x A WEEK   . estradiol (EVAMIST) 1.53 MG/SPRAY transdermal spray Place 1  spray onto the skin daily.  . Garlic 7017 MG CAPS Take 1 capsule by mouth daily.  . hydrochlorothiazide (HYDRODIURIL) 25 MG tablet Take 1 tablet (25 mg total) by mouth daily.  . hydrochlorothiazide (HYDRODIURIL) 25 MG tablet Take 1 tablet (25 mg total) by mouth daily.  Marland Kitchen lidocaine (LIDODERM) 5 % Place 1 patch onto the skin daily. Remove & Discard patch within 12 hours or as directed by MD  . metoprolol succinate (TOPROL-XL) 25 MG 24 hr tablet Take 1 tablet (25 mg total) by mouth daily.  . Misc Natural Products (COSAMIN ASU ADVANCED FORMULA PO) Take by mouth 2 (two) times daily.  . Multiple Vitamin (MULTIVITAMIN) tablet Take 1 tablet by mouth daily.  . Omega-3 Fatty Acids (FISH OIL) 1000 MG CAPS Take by mouth daily.  . Probiotic Product (PROBIOTIC PEARLS PO) Take 1 tablet by mouth daily.  . simvastatin (ZOCOR) 40 MG tablet Take 1 tablet (40 mg total) by mouth at bedtime.  . Turmeric 500 MG CAPS Take 500 mg by mouth daily.  . valACYclovir (VALTREX) 1000 MG tablet Take 1,000 mg by mouth. 1/2 by mouth once daily   No facility-administered encounter medications on file as of 10/21/2016.     Allergies (verified) Patient has no known allergies.   History: Past Medical History:  Diagnosis  Date  . Allergy    RHINITIS  . Anxiety 1989   Dr Jake Michaelis, [place]  . Depression    Dr Toy Care  . Fibroids   . GERD 11/11/2009   Qualifier: Diagnosis of  By: Linna Darner MD, Gwyndolyn Saxon   Dysphagia once monthly on average 4-10/14 ; resolved with Prilosec OTC prn No PMH of endoscopy    . GERD (gastroesophageal reflux disease)   . Hyperlipidemia   . Hypertension    Past Surgical History:  Procedure Laterality Date  . ABDOMINAL HYSTERECTOMY     & USO  . ANAL FISTULA ECTOMY      X 2, Dr Rosana Hoes  . COLONOSCOPY  2004, 2015   Carlean Purl  . MYOMECTOMY     FOR FIBROIDS, Dr Ubaldo Glassing  . steroid injection shoulder  05/11/2013   contusion/strain; Dr Onnie Graham   Family History  Problem Relation Age of Onset  . Arthritis Mother   .  Stroke Mother 68  . Alcohol abuse Father   . Hyperlipidemia Sister   . Hypertension Sister   . Atrial fibrillation Sister   . Breast cancer Paternal Aunt   . Diabetes Maternal Grandmother   . Heart disease Paternal Grandmother        CAD; no MI  . Stroke Paternal Grandmother        in 68s  . Stroke Paternal Grandfather        in late 35s  . Colon cancer Neg Hx    Social History   Occupational History  . SALES    Social History Main Topics  . Smoking status: Never Smoker  . Smokeless tobacco: Never Used  . Alcohol use Yes     Comment:  1-2 / day  . Drug use: No  . Sexual activity: Not on file    Tobacco Counseling Counseling given: Not Answered   Activities of Daily Living No flowsheet data found.  Immunizations and Health Maintenance Immunization History  Administered Date(s) Administered  . Influenza Split 02/02/2011, 01/10/2012  . Influenza Whole 12/22/2009  . Influenza, High Dose Seasonal PF 01/22/2016  . Influenza,inj,Quad PF,36+ Mos 01/17/2014  . Pneumococcal Conjugate-13 03/08/2016  . Td 11/11/2009  . Zoster 01/20/2015   Health Maintenance Due  Topic Date Due  . MAMMOGRAM  06/28/2014  . DEXA SCAN  02/13/2015    Patient Care Team: Binnie Rail, MD as PCP - General (Internal Medicine)  Indicate any recent Medical Services you may have received from other than [facility] providers in the past year (date may be approximate).     Assessment:   This is a routine wellness examination for Jasmine Bray. Physical assessment deferred to PCP.   Hearing/Vision screen No exam data present  Dietary issues and exercise activities discussed:   Diet (meal preparation, eat out, water intake, caffeinated beverages, dairy products, fruits and vegetables): in general, a "healthy" diet  , well balanced, eats a variety of fruits and vegetables daily, limits salt, fat/cholesterol, sugar, caffeine, drinks 6-8 glasses of water daily.  Goals    None     Depression  Screen PHQ 2/9 Scores 01/22/2016 01/10/2013  PHQ - 2 Score 0 0    Fall Risk Fall Risk  01/22/2016 01/10/2013  Falls in the past year? Yes No  Number falls in past yr: 2 or more -  Injury with Fall? No -    Cognitive Function:       Ad8 score reviewed for issues:  Issues making decisions: no  Less interest in hobbies / activities:  no  Repeats questions, stories (family complaining): no  Trouble using ordinary gadgets (microwave, computer, phone):no  Forgets the month or year: no  Mismanaging finances: no  Remembering appts: no  Daily problems with thinking and/or memory: no Ad8 score is= 0  Screening Tests Health Maintenance  Topic Date Due  . MAMMOGRAM  06/28/2014  . DEXA SCAN  02/13/2015  . INFLUENZA VACCINE  10/27/2016  . PNA vac Low Risk Adult (2 of 2 - PPSV23) 03/08/2017  . TETANUS/TDAP  11/12/2019  . COLONOSCOPY  09/26/2023  . Hepatitis C Screening  Completed      Plan:    Continue doing brain stimulating activities (puzzles, reading, adult coloring books, staying active) to keep memory sharp.   Continue to eat heart healthy diet (full of fruits, vegetables, whole grains, lean protein, water--limit salt, fat, and sugar intake) and increase physical activity as tolerated.  I have personally reviewed and noted the following in the patient's chart:   . Medical and social history . Use of alcohol, tobacco or illicit drugs  . Current medications and supplements . Functional ability and status . Nutritional status . Physical activity . Advanced directives . List of other physicians . Vitals . Screenings to include cognitive, depression, and falls . Referrals and appointments  In addition, I have reviewed and discussed with patient certain preventive protocols, quality metrics, and best practice recommendations. A written personalized care plan for preventive services as well as general preventive health recommendations were provided to patient.      Michiel Cowboy, RN   10/21/2016    Medical screening examination/treatment/procedure(s) were performed by non-physician practitioner and as supervising physician I was immediately available for consultation/collaboration. I agree with above. Walker Kehr, MD

## 2016-11-10 DIAGNOSIS — Z79890 Hormone replacement therapy (postmenopausal): Secondary | ICD-10-CM | POA: Diagnosis not present

## 2016-11-10 DIAGNOSIS — Z1231 Encounter for screening mammogram for malignant neoplasm of breast: Secondary | ICD-10-CM | POA: Diagnosis not present

## 2016-11-10 DIAGNOSIS — Z780 Asymptomatic menopausal state: Secondary | ICD-10-CM | POA: Diagnosis not present

## 2016-12-30 DIAGNOSIS — H5203 Hypermetropia, bilateral: Secondary | ICD-10-CM | POA: Diagnosis not present

## 2016-12-30 DIAGNOSIS — H04563 Stenosis of bilateral lacrimal punctum: Secondary | ICD-10-CM | POA: Diagnosis not present

## 2017-01-04 ENCOUNTER — Other Ambulatory Visit: Payer: Self-pay | Admitting: Internal Medicine

## 2017-01-23 NOTE — Assessment & Plan Note (Addendum)
Will recheck HCV Ab - ? Fast positive or true infection

## 2017-01-23 NOTE — Patient Instructions (Addendum)
Get pneumovax 23 after 03/08/17   Test(s) ordered today. Your results will be released to [place] (or called to you) after review, usually within 72hours after test completion. If any changes need to be made, you will be notified at that same time.  All other Health Maintenance issues reviewed.   All recommended immunizations and age-appropriate screenings are up-to-date or discussed.  Flu immunization administered today.   Medications reviewed and updated.  No changes recommended at this time.   Please followup in one year   Health Maintenance, Female Adopting a healthy lifestyle and getting preventive care can go a long way to promote health and wellness. Talk with your health care provider about what schedule of regular examinations is right for you. This is a good chance for you to check in with your provider about disease prevention and staying healthy. In between checkups, there are plenty of things you can do on your own. Experts have done a lot of research about which lifestyle changes and preventive measures are most likely to keep you healthy. Ask your health care provider for more information. Weight and diet Eat a healthy diet  Be sure to include plenty of vegetables, fruits, low-fat dairy products, and lean protein.  Do not eat a lot of foods high in solid fats, added sugars, or salt.  Get regular exercise. This is one of the most important things you can do for your health. ? Most adults should exercise for at least 150 minutes each week. The exercise should increase your heart rate and make you sweat (moderate-intensity exercise). ? Most adults should also do strengthening exercises at least twice a week. This is in addition to the moderate-intensity exercise.  Maintain a healthy weight  Body mass index (BMI) is a measurement that can be used to identify possible weight problems. It estimates body fat based on height and weight. Your health care provider can help  determine your BMI and help you achieve or maintain a healthy weight.  For females 66 years of age and older: ? A BMI below 18.5 is considered underweight. ? A BMI of 18.5 to 24.9 is normal. ? A BMI of 25 to 29.9 is considered overweight. ? A BMI of 30 and above is considered obese.  Watch levels of cholesterol and blood lipids  You should start having your blood tested for lipids and cholesterol at 67 years of age, then have this test every 5 years.  You may need to have your cholesterol levels checked more often if: ? Your lipid or cholesterol levels are high. ? You are older than 67 years of age. ? You are at high risk for heart disease.  Cancer screening Lung Cancer  Lung cancer screening is recommended for adults 20-78 years old who are at high risk for lung cancer because of a history of smoking.  A yearly low-dose CT scan of the lungs is recommended for people who: ? Currently smoke. ? Have quit within the past 15 years. ? Have at least a 30-pack-year history of smoking. A pack year is smoking an average of one pack of cigarettes a day for 1 year.  Yearly screening should continue until it has been 15 years since you quit.  Yearly screening should stop if you develop a health problem that would prevent you from having lung cancer treatment.  Breast Cancer  Practice breast self-awareness. This means understanding how your breasts normally appear and feel.  It also means doing regular breast self-exams. Let your  health care provider know about any changes, no matter how small.  If you are in your 20s or 30s, you should have a clinical breast exam (CBE) by a health care provider every 1-3 years as part of a regular health exam.  If you are 42 or older, have a CBE every year. Also consider having a breast X-ray (mammogram) every year.  If you have a family history of breast cancer, talk to your health care provider about genetic screening.  If you are at high risk for  breast cancer, talk to your health care provider about having an MRI and a mammogram every year.  Breast cancer gene (BRCA) assessment is recommended for women who have family members with BRCA-related cancers. BRCA-related cancers include: ? Breast. ? Ovarian. ? Tubal. ? Peritoneal cancers.  Results of the assessment will determine the need for genetic counseling and BRCA1 and BRCA2 testing.  Cervical Cancer Your health care provider may recommend that you be screened regularly for cancer of the pelvic organs (ovaries, uterus, and vagina). This screening involves a pelvic examination, including checking for microscopic changes to the surface of your cervix (Pap test). You may be encouraged to have this screening done every 3 years, beginning at age 23.  For women ages 54-65, health care providers may recommend pelvic exams and Pap testing every 3 years, or they may recommend the Pap and pelvic exam, combined with testing for human papilloma virus (HPV), every 5 years. Some types of HPV increase your risk of cervical cancer. Testing for HPV may also be done on women of any age with unclear Pap test results.  Other health care providers may not recommend any screening for nonpregnant women who are considered low risk for pelvic cancer and who do not have symptoms. Ask your health care provider if a screening pelvic exam is right for you.  If you have had past treatment for cervical cancer or a condition that could lead to cancer, you need Pap tests and screening for cancer for at least 20 years after your treatment. If Pap tests have been discontinued, your risk factors (such as having a new sexual partner) need to be reassessed to determine if screening should resume. Some women have medical problems that increase the chance of getting cervical cancer. In these cases, your health care provider may recommend more frequent screening and Pap tests.  Colorectal Cancer  This type of cancer can be  detected and often prevented.  Routine colorectal cancer screening usually begins at 67 years of age and continues through 67 years of age.  Your health care provider may recommend screening at an earlier age if you have risk factors for colon cancer.  Your health care provider may also recommend using home test kits to check for hidden blood in the stool.  A small camera at the end of a tube can be used to examine your colon directly (sigmoidoscopy or colonoscopy). This is done to check for the earliest forms of colorectal cancer.  Routine screening usually begins at age 33.  Direct examination of the colon should be repeated every 5-10 years through 67 years of age. However, you may need to be screened more often if early forms of precancerous polyps or small growths are found.  Skin Cancer  Check your skin from head to toe regularly.  Tell your health care provider about any new moles or changes in moles, especially if there is a change in a mole's shape or color.  Also  tell your health care provider if you have a mole that is larger than the size of a pencil eraser.  Always use sunscreen. Apply sunscreen liberally and repeatedly throughout the day.  Protect yourself by wearing long sleeves, pants, a wide-brimmed hat, and sunglasses whenever you are outside.  Heart disease, diabetes, and high blood pressure  High blood pressure causes heart disease and increases the risk of stroke. High blood pressure is more likely to develop in: ? People who have blood pressure in the high end of the normal range (130-139/85-89 mm Hg). ? People who are overweight or obese. ? People who are African American.  If you are 43-71 years of age, have your blood pressure checked every 3-5 years. If you are 78 years of age or older, have your blood pressure checked every year. You should have your blood pressure measured twice-once when you are at a hospital or clinic, and once when you are not at a  hospital or clinic. Record the average of the two measurements. To check your blood pressure when you are not at a hospital or clinic, you can use: ? An automated blood pressure machine at a pharmacy. ? A home blood pressure monitor.  If you are between 72 years and 57 years old, ask your health care provider if you should take aspirin to prevent strokes.  Have regular diabetes screenings. This involves taking a blood sample to check your fasting blood sugar level. ? If you are at a normal weight and have a low risk for diabetes, have this test once every three years after 67 years of age. ? If you are overweight and have a high risk for diabetes, consider being tested at a younger age or more often. Preventing infection Hepatitis B  If you have a higher risk for hepatitis B, you should be screened for this virus. You are considered at high risk for hepatitis B if: ? You were born in a country where hepatitis B is common. Ask your health care provider which countries are considered high risk. ? Your parents were born in a high-risk country, and you have not been immunized against hepatitis B (hepatitis B vaccine). ? You have HIV or AIDS. ? You use needles to inject street drugs. ? You live with someone who has hepatitis B. ? You have had sex with someone who has hepatitis B. ? You get hemodialysis treatment. ? You take certain medicines for conditions, including cancer, organ transplantation, and autoimmune conditions.  Hepatitis C  Blood testing is recommended for: ? Everyone born from [birth year] through 1966. ? Anyone with known risk factors for hepatitis C.  Sexually transmitted infections (STIs)  You should be screened for sexually transmitted infections (STIs) including gonorrhea and chlamydia if: ? You are sexually active and are younger than 67 years of age. ? You are older than 67 years of age and your health care provider tells you that you are at risk for this type of  infection. ? Your sexual activity has changed since you were last screened and you are at an increased risk for chlamydia or gonorrhea. Ask your health care provider if you are at risk.  If you do not have HIV, but are at risk, it may be recommended that you take a prescription medicine daily to prevent HIV infection. This is called pre-exposure prophylaxis (PrEP). You are considered at risk if: ? You are sexually active and do not regularly use condoms or know the HIV status of your  partner(s). ? You take drugs by injection. ? You are sexually active with a partner who has HIV.  Talk with your health care provider about whether you are at high risk of being infected with HIV. If you choose to begin PrEP, you should first be tested for HIV. You should then be tested every 3 months for as long as you are taking PrEP. Pregnancy  If you are premenopausal and you may become pregnant, ask your health care provider about preconception counseling.  If you may become pregnant, take 400 to 800 micrograms (mcg) of folic acid every day.  If you want to prevent pregnancy, talk to your health care provider about birth control (contraception). Osteoporosis and menopause  Osteoporosis is a disease in which the bones lose minerals and strength with aging. This can result in serious bone fractures. Your risk for osteoporosis can be identified using a bone density scan.  If you are 34 years of age or older, or if you are at risk for osteoporosis and fractures, ask your health care provider if you should be screened.  Ask your health care provider whether you should take a calcium or vitamin D supplement to lower your risk for osteoporosis.  Menopause may have certain physical symptoms and risks.  Hormone replacement therapy may reduce some of these symptoms and risks. Talk to your health care provider about whether hormone replacement therapy is right for you. Follow these instructions at home:  Schedule  regular health, dental, and eye exams.  Stay current with your immunizations.  Do not use any tobacco products including cigarettes, chewing tobacco, or electronic cigarettes.  If you are pregnant, do not drink alcohol.  If you are breastfeeding, limit how much and how often you drink alcohol.  Limit alcohol intake to no more than 1 drink per day for nonpregnant women. One drink equals 12 ounces of beer, 5 ounces of wine, or 1 ounces of hard liquor.  Do not use street drugs.  Do not share needles.  Ask your health care provider for help if you need support or information about quitting drugs.  Tell your health care provider if you often feel depressed.  Tell your health care provider if you have ever been abused or do not feel safe at home. This information is not intended to replace advice given to you by your health care provider. Make sure you discuss any questions you have with your health care provider. Document Released: 09/28/2010 Document Revised: 08/21/2015 Document Reviewed: 12/17/2014 Elsevier Interactive Patient Education  [employer].

## 2017-01-23 NOTE — Progress Notes (Signed)
Subjective:    Patient ID: Jasmine Bray, female    DOB: 22-Dec-1950, 66 y.o.   MRN: [id number]  HPI She is here for a physical exam.   She has no concerns.   Medications and allergies reviewed with patient and updated if appropriate.  Patient Active Problem List   Diagnosis Date Noted  . Left upper arm pain 03/17/2016  . Hepatitis C antibody test positive 01/29/2016  . Tachycardia 11/06/2015  . Right shoulder pain 04/24/2013  . Fasting hyperglycemia 01/10/2012  . Hyperlipidemia 11/11/2009  . 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 Essential hypertension 11/11/2009    Current Outpatient Prescriptions on File Prior to Visit  Medication Sig Dispense Refill  . ALPRAZolam (XANAX) 1 MG tablet Take 1 mg by mouth as needed.    Marland Kitchen aspirin EC 81 MG tablet Take 81 mg by mouth daily.    Marland Kitchen b complex vitamins tablet Take 1 tablet by mouth daily.    . Biotin 1 MG CAPS Take 1 tablet by mouth daily.    Marland Kitchen buPROPion (WELLBUTRIN XL) 150 MG 24 hr tablet Take 150 mg by mouth daily.    . Cholecalciferol (VITAMIN D3) 3000 UNITS TABS Take 1 capsule by mouth daily. Taking 1000 units daily    . diclofenac sodium (VOLTAREN) 1 % GEL Apply 4 g topically 4 (four) times daily as needed. 400 g 1  . escitalopram (LEXAPRO) 20 MG tablet Take 10 mg by mouth daily.     Marland Kitchen estradiol (ESTRACE) 0.1 MG/GM vaginal cream Place 2 g vaginally. 1 GRAM 3x A WEEK     . estradiol (EVAMIST) 1.53 MG/SPRAY transdermal spray Place 1 spray onto the skin daily.    . Garlic 2633 MG CAPS Take 1 capsule by mouth daily.    . hydrochlorothiazide (HYDRODIURIL) 25 MG tablet Take 1 tablet by mouth daily 90 tablet 0  . Misc Natural Products (COSAMIN ASU ADVANCED FORMULA PO) Take by mouth 2 (two) times daily.    . Multiple Vitamin (MULTIVITAMIN) tablet Take 1 tablet by mouth daily.    . Omega-3 Fatty Acids (FISH OIL) 1000 MG CAPS Take by mouth daily.    . Probiotic Product (PROBIOTIC PEARLS PO) Take 1 tablet by mouth daily.    .  simvastatin (ZOCOR) 40 MG tablet Take 1 tablet (40 mg total) by mouth at bedtime. 10 tablet 0  . Turmeric 500 MG CAPS Take 500 mg by mouth daily.    . valACYclovir (VALTREX) 1000 MG tablet Take 1,000 mg by mouth. 1/2 by mouth once daily     No current facility-administered medications on file prior to visit.     Past Medical History:  Diagnosis Date  . Allergy    RHINITIS  . Anxiety 1989   Dr Jake Michaelis, [place]  . Depression    Dr Toy Care  . Fibroids   . GERD 11/11/2009   Qualifier: Diagnosis of  By: Linna Darner MD, Gwyndolyn Saxon   Dysphagia once monthly on average 4-10/14 ; resolved with Prilosec OTC prn No PMH of endoscopy    . GERD (gastroesophageal reflux disease)   . Hyperlipidemia   . Hypertension     Past Surgical History:  Procedure Laterality Date  . ABDOMINAL HYSTERECTOMY     & USO  . ANAL FISTULA ECTOMY      X 2, Dr Rosana Hoes  . COLONOSCOPY  2004, 2015   Carlean Purl  . MYOMECTOMY     FOR FIBROIDS, Dr Ubaldo Glassing  . steroid injection shoulder  05/11/2013  contusion/strain; Dr Onnie Graham    Social History   Social History  . Marital status: Divorced    Spouse name: N/A  . Number of children: N/A  . Years of education: N/A   Occupational History  . SALES    Social History Main Topics  . Smoking status: Never Smoker  . Smokeless tobacco: Never Used  . Alcohol use Yes     Comment:  1-2 / day  . Drug use: No  . Sexual activity: Not Asked   Other Topics Concern  . None   Social History Narrative   GET[employer] REG EXERCISE          Family History  Problem Relation Age of Onset  . Arthritis Mother   . Stroke Mother 83  . Alcohol abuse Father   . Hyperlipidemia Sister   . Hypertension Sister   . Atrial fibrillation Sister   . Breast cancer Paternal Aunt   . Diabetes Maternal Grandmother   . Heart disease Paternal Grandmother        CAD; no MI  . Stroke Paternal Grandmother        in 42s  . Stroke Paternal Grandfather        in late 54s  . Colon cancer Neg Hx     Review of Systems   Constitutional: Negative for chills and fever.  Eyes: Negative for visual disturbance.  Respiratory: Negative for cough, shortness of breath and wheezing.   Cardiovascular: Negative for chest pain, palpitations and leg swelling.  Gastrointestinal: Negative for abdominal pain, blood in stool, constipation, diarrhea and nausea.       GERD occasionally  Genitourinary: Negative for dysuria and hematuria.  Musculoskeletal: Positive for arthralgias (hand pain from arthritis). Negative for back pain.  Skin: Negative for color change and rash.  Neurological: Negative for light-headedness and headaches.  Psychiatric/Behavioral: Negative for dysphoric mood. The patient is not nervous/anxious.        Objective:   Vitals:   01/24/17 1040  BP: (!) 142/84  Pulse: 69  Resp: 16  Temp: 97.7 F (36.5 C)  SpO2: 95%   Filed Weights   01/24/17 1040  Weight: 188 lb (85.3 kg)   Body mass index is 29.44 kg/m.  Wt Readings from Last 3 Encounters:  01/24/17 188 lb (85.3 kg)  10/21/16 182 lb (82.6 kg)  01/22/16 182 lb (82.6 kg)     Physical Exam Constitutional: She appears well-developed and well-nourished. No distress.  HENT:  Head: Normocephalic and atraumatic.  Right Ear: External ear normal. Normal ear canal and TM Left Ear: External ear normal.  Normal ear canal and TM Mouth/Throat: Oropharynx is clear and moist.  Eyes: Conjunctivae and EOM are normal.  Neck: Neck supple. No tracheal deviation present. No thyromegaly present.  No carotid bruit  Cardiovascular: Normal rate, regular rhythm and normal heart sounds.   No murmur heard.  No edema. Pulmonary/Chest: Effort normal and breath sounds normal. No respiratory distress. She has no wheezes. She has no rales.  Breast: deferred to Gyn Abdominal: Soft. She exhibits no distension. There is no tenderness.  Lymphadenopathy: She has no cervical adenopathy.  Skin: Skin is warm and dry. She is not diaphoretic.  Psychiatric: She has a  normal mood and affect. Her behavior is normal.        Assessment & Plan:   Physical exam: Screening blood work ordered Immunizations   Flu vaccine today,  Discussed shingrix Colonoscopy  Up to date  Mammogram  Up to date  Gyn  Up to date  Dexa has not had one in several years - due -- will order Eye exams  Up to date  EKG   Last done 2017 Exercise 2 cross fit classes a week, rides horses Weight - working on weight loss Skin  No concerns, saw derm recently Substance abuse - advised her to drink less alcohol  See Problem List for Assessment and Plan of chronic medical problems.

## 2017-01-24 ENCOUNTER — Encounter: Payer: Self-pay | Admitting: Internal Medicine

## 2017-01-24 ENCOUNTER — Other Ambulatory Visit (INDEPENDENT_AMBULATORY_CARE_PROVIDER_SITE_OTHER): Payer: PPO

## 2017-01-24 ENCOUNTER — Ambulatory Visit (INDEPENDENT_AMBULATORY_CARE_PROVIDER_SITE_OTHER): Payer: PPO | Admitting: Internal Medicine

## 2017-01-24 VITALS — BP 142/84 | HR 69 | Temp 97.7°F | Resp 16 | Ht 67.0 in | Wt 188.0 lb

## 2017-01-24 DIAGNOSIS — R768 Other specified abnormal immunological findings in serum: Secondary | ICD-10-CM | POA: Diagnosis not present

## 2017-01-24 DIAGNOSIS — Z0000 Encounter for general adult medical examination without abnormal findings: Secondary | ICD-10-CM

## 2017-01-24 DIAGNOSIS — R000 Tachycardia, unspecified: Secondary | ICD-10-CM

## 2017-01-24 DIAGNOSIS — E785 Hyperlipidemia, unspecified: Secondary | ICD-10-CM | POA: Diagnosis not present

## 2017-01-24 DIAGNOSIS — I10 Essential (primary) hypertension: Secondary | ICD-10-CM | POA: Diagnosis not present

## 2017-01-24 DIAGNOSIS — Z23 Encounter for immunization: Secondary | ICD-10-CM

## 2017-01-24 DIAGNOSIS — R7301 Impaired fasting glucose: Secondary | ICD-10-CM

## 2017-01-24 DIAGNOSIS — Z13820 Encounter for screening for osteoporosis: Secondary | ICD-10-CM

## 2017-01-24 LAB — COMPREHENSIVE METABOLIC PANEL
ALK PHOS: 62 U/L (ref 39–117)
ALT: 39 U/L — AB (ref 0–35)
AST: 38 U/L — AB (ref 0–37)
Albumin: 4.3 g/dL (ref 3.5–5.2)
BILIRUBIN TOTAL: 0.5 mg/dL (ref 0.2–1.2)
BUN: 14 mg/dL (ref 6–23)
CALCIUM: 9.4 mg/dL (ref 8.4–10.5)
CO2: 28 mEq/L (ref 19–32)
CREATININE: 0.63 mg/dL (ref 0.40–1.20)
Chloride: 99 mEq/L (ref 96–112)
GFR: 100.2 mL/min (ref >60.00)
GLUCOSE: 106 mg/dL — AB (ref 70–99)
Potassium: 3.6 mEq/L (ref 3.5–5.1)
Sodium: 136 mEq/L (ref 135–145)
TOTAL PROTEIN: 7 g/dL (ref 6.0–8.3)

## 2017-01-24 LAB — CBC WITH DIFFERENTIAL/PLATELET
BASOS ABS: 0.1 10*3/uL (ref 0.0–0.1)
Basophils Relative: 1.4 % (ref 0.0–3.0)
Eosinophils Absolute: 0.3 10*3/uL (ref 0.0–0.7)
Eosinophils Relative: 3.1 % (ref 0.0–5.0)
HCT: 41.5 % (ref 36.0–46.0)
Hemoglobin: 13.9 g/dL (ref 12.0–15.0)
Lymphocytes Relative: 30.9 % (ref 12.0–46.0)
Lymphs Abs: 2.5 10*3/uL (ref 0.7–4.0)
MCHC: 33.4 g/dL (ref 30.0–36.0)
MCV: 95.6 fl (ref 78.0–100.0)
MONOS PCT: 8.8 % (ref 3.0–12.0)
Monocytes Absolute: 0.7 10*3/uL (ref 0.1–1.0)
Neutro Abs: 4.6 10*3/uL (ref 1.4–7.7)
Neutrophils Relative %: 55.8 % (ref 43.0–77.0)
Platelets: 219 10*3/uL (ref 150.0–400.0)
RBC: 4.34 Mil/uL (ref 3.87–5.11)
RDW: 13.1 % (ref 11.5–15.5)
WBC: 8.2 10*3/uL (ref 4.0–10.5)

## 2017-01-24 LAB — TSH: TSH: 1.95 u[IU]/mL (ref 0.35–4.50)

## 2017-01-24 LAB — HEMOGLOBIN A1C: Hgb A1c MFr Bld: 5.6 % (ref 4.6–6.5)

## 2017-01-24 LAB — LIPID PANEL
CHOL/HDL RATIO: 4
CHOLESTEROL: 181 mg/dL (ref 0–200)
HDL: 50.4 mg/dL (ref >39.00)
LDL Cholesterol: 107 mg/dL — ABNORMAL HIGH (ref 0–99)
NonHDL: 130.71
TRIGLYCERIDES: 118 mg/dL (ref 0.0–149.0)
VLDL: 23.6 mg/dL (ref 0.0–40.0)

## 2017-01-24 MED ORDER — HYDROCHLOROTHIAZIDE 25 MG PO TABS: 25.0000 mg | ORAL_TABLET | Freq: Every day | ORAL | 3 refills | Status: DC

## 2017-01-24 MED ORDER — SIMVASTATIN 40 MG PO TABS: 40.0000 mg | ORAL_TABLET | Freq: Every day | ORAL | 3 refills | Status: DC

## 2017-01-24 NOTE — Assessment & Plan Note (Signed)
Stopped metoprolol due to fatigue - feels better Can take prn for heart racing Work up neg in past for tachycardia Stress test 10/2015 negative

## 2017-01-24 NOTE — Assessment & Plan Note (Signed)
Will check a1c

## 2017-01-24 NOTE — Assessment & Plan Note (Signed)
Check lipid panel  Continue daily statin Regular exercise and healthy diet encouraged  

## 2017-01-24 NOTE — Assessment & Plan Note (Addendum)
BP just above normal today  Stopped metoprolol  Continue hctz She will start monitoring her BP at home to make sure it is well controlled cmp

## 2017-01-27 LAB — HEPATITIS C ANTIBODY
HEP C AB: REACTIVE — AB
SIGNAL TO CUT-OFF: 10 — ABNORMAL HIGH (ref <1.00)

## 2017-01-27 LAB — HCV RNA,QUANTITATIVE REAL TIME PCR
HCV Quantitative Log: <1.18 Log IU/mL
HCV RNA, PCR, QN: <15 IU/mL

## 2017-01-28 ENCOUNTER — Encounter: Payer: Self-pay | Admitting: Internal Medicine

## 2017-02-01 ENCOUNTER — Ambulatory Visit (INDEPENDENT_AMBULATORY_CARE_PROVIDER_SITE_OTHER)
Admission: RE | Admit: 2017-02-01 | Discharge: 2017-02-01 | Disposition: A | Discharge status: Home / Self Care | Payer: PPO | Source: Ambulatory Visit | Attending: Family Medicine | Admitting: Family Medicine

## 2017-02-01 ENCOUNTER — Ambulatory Visit: Payer: PPO | Admitting: Family Medicine

## 2017-02-01 ENCOUNTER — Encounter: Payer: Self-pay | Admitting: Family Medicine

## 2017-02-01 VITALS — BP 136/78 | HR 65 | Temp 98.1°F | Ht 67.0 in | Wt 188.0 lb

## 2017-02-01 DIAGNOSIS — M25511 Pain in right shoulder: Secondary | ICD-10-CM | POA: Diagnosis not present

## 2017-02-01 DIAGNOSIS — M25551 Pain in right hip: Secondary | ICD-10-CM

## 2017-02-01 DIAGNOSIS — R0781 Pleurodynia: Secondary | ICD-10-CM

## 2017-02-01 DIAGNOSIS — S299XXA Unspecified injury of thorax, initial encounter: Secondary | ICD-10-CM | POA: Diagnosis not present

## 2017-02-01 MED ORDER — MELOXICAM 15 MG PO TABS: 15.0000 mg | ORAL_TABLET | Freq: Every day | ORAL | 0 refills | Status: DC

## 2017-02-01 MED ORDER — TRAMADOL HCL 50 MG PO TABS: 50.0000 mg | ORAL_TABLET | Freq: Three times a day (TID) | ORAL | 0 refills | Status: DC | PRN

## 2017-02-01 NOTE — Assessment & Plan Note (Signed)
No deficits on exam. Strength is intact. Likely just a contusion - If no improvement can perform an ultrasound exam.

## 2017-02-01 NOTE — Patient Instructions (Signed)
Thank you for coming in,   We will call you with the results from today,     Please feel free to call with any questions or concerns at any time, at [phone]. --Dr. Raeford Razor   Rib Fracture A rib fracture is a break or crack in one of the bones of the ribs. The ribs are like a cage that goes around your upper chest. A broken or cracked rib is often painful, but most do not cause other problems. Most rib fractures heal on their own in 1-3 months. Follow these instructions at home:  Avoid activities that cause pain to the injured area. Protect your injured area.  Slowly increase activity as told by your doctor.  Take medicine as told by your doctor.  Put ice on the injured area for the first 1-2 days after you have been treated or as told by your doctor. ? Put ice in a plastic bag. ? Place a towel between your skin and the bag. ? Leave the ice on for 15-20 minutes at a time, every 2 hours while you are awake.  Do deep breathing as told by your doctor. You may be told to: ? Take deep breaths many times a day. ? Cough many times a day while hugging a pillow. ? Use a device (incentive spirometer) to perform deep breathing many times a day.  Drink enough fluids to keep your pee (urine) clear or pale yellow.  Do not wear a rib belt or binder. These do not allow you to breathe deeply. Get help right away if:  You have a fever.  You have trouble breathing.  You cannot stop coughing.  You cough up thick or bloody spit (mucus).  You feel sick to your stomach (nauseous), throw up (vomit), or have belly (abdominal) pain.  Your pain gets worse and medicine does not help. This information is not intended to replace advice given to you by your health care provider. Make sure you discuss any questions you have with your health care provider. Document Released: 12/23/2007 Document Revised: 08/21/2015 Document Reviewed: 05/17/2012 Elsevier Interactive Patient Education  [employer].

## 2017-02-01 NOTE — Assessment & Plan Note (Signed)
Significant pain on exam. Possible for rib fracture. No suggestion of pneumothorax on exam today. - X-rays. - Meloxicam and tramadol for severe pain.

## 2017-02-01 NOTE — Assessment & Plan Note (Signed)
Concerned that she may have a Morel Lavallee lesion.  - We'll advise her to follow-up later this week for ultrasound and possible aspiration. May need to obtain an MRI.

## 2017-02-01 NOTE — Progress Notes (Signed)
Jasmine Bray - 66 y.o. female MRN [id number]  Date of birth: [birth year]  SUBJECTIVE:  Including CC & ROS.  Chief Complaint  Patient presents with  . Right Rib and Hip pain    She fell off her horse on 01/30/17, she has been taking Aleve to help with the pain. She states her right hip is bruised and swollen.     Jasmine Bray is a 66 y.o. female that is presenting with right shoulder, right rib, and right hip pain. She was riding her horse on Sunday and had a fall. She landed on her right side. Since that time she has had significant pain. The pain is worse with moving and bending over. The pain of her shoulder seems to be improved. The worst pain is in her ribs which is moderate to severe in nature. She does not have any significant bruising on her ribs. She does have some bruising on the right lateral aspect of her hip. The pain is localized in each of these regions. She denies any significant shortness of breath. She has not had any chest pain. She has not had any change in her bowel movements. Has been taking Motrin and ibuprofen for the pain. Has a history of a horse accident several years ago where she fell on her right side and had some changes into the soft tissue in that region.  Independent review of her right hip x-ray from 2009 is normal.  Review of her MRI of her right hip from 2009 shows a torn segment of the right iliotibial band of the interface of the gluteus medius with the gluteus maximus. This does not have a typical focal masslike appearance of atypical Sherry Ruffing lesion.   Review of Systems  Respiratory: Negative for shortness of breath.   Musculoskeletal: Positive for myalgias. Negative for arthralgias, gait problem and joint swelling.  Skin: Positive for color change.  Neurological: Negative for weakness and numbness.  Hematological: Negative for adenopathy.    HISTORY: Past Medical, Surgical, Social, and Family History Reviewed & Updated per EMR.   Pertinent  Historical Findings include:  Past Medical History:  Diagnosis Date  . Allergy    RHINITIS  . Anxiety 1989   Jasmine Jasmine Bray, [place]  . Depression    Jasmine Jasmine Bray  . Fibroids   . GERD 11/11/2009   Qualifier: Diagnosis of  Jasmine: Jasmine Bray, Jasmine Bray   Dysphagia once monthly on average 4-10/14 ; resolved with Prilosec OTC prn No PMH of endoscopy    . GERD (gastroesophageal reflux disease)   . Hyperlipidemia   . Hypertension     Past Surgical History:  Procedure Laterality Date  . ABDOMINAL HYSTERECTOMY     & USO  . ANAL FISTULA ECTOMY      X 2, Jasmine Bray  . COLONOSCOPY  2004, 2015   Jasmine Bray  . MYOMECTOMY     FOR FIBROIDS, Jasmine Bray  . steroid injection shoulder  05/11/2013   contusion/strain; Jasmine Bray    No Known Allergies  Family History  Problem Relation Age of Onset  . Arthritis Mother   . Stroke Mother 31  . Alcohol abuse Father   . Hyperlipidemia Sister   . Hypertension Sister   . Atrial fibrillation Sister   . Breast cancer Paternal Aunt   . Diabetes Maternal Grandmother   . Heart disease Paternal Grandmother        CAD; no MI  . Stroke Paternal Grandmother        in  21s  . Stroke Paternal Grandfather        in late 41s  . Colon cancer Neg Hx      Social History   Socioeconomic History  . Marital status: Divorced    Spouse name: Not on file  . Number of children: Not on file  . Years of education: Not on file  . Highest education level: Not on file  Social Needs  . Financial resource strain: Not on file  . Food insecurity - worry: Not on file  . Food insecurity - inability: Not on file  . Transportation needs - medical: Not on file  . Transportation needs - non-medical: Not on file  Occupational History  . Occupation: Press photographer  Tobacco Use  . Smoking status: Never Smoker  . Smokeless tobacco: Never Used  Substance and Sexual Activity  . Alcohol use: Yes    Comment: 3-4/ day five nights a week  . Drug use: No  . Sexual activity: Not on file  Other Topics  Concern  . Not on file  Social History Narrative   GETS REG EXERCISE           PHYSICAL EXAM:  VS: BP 136/78 (BP Location: Left Arm, Patient Position: Sitting, Cuff Size: Normal)   Pulse 65   Temp 98.1 F (36.7 C) (Oral)   Ht 5\' 7"  (1.702 m)   Wt 188 lb (85.3 kg)   SpO2 95%   BMI 29.44 kg/m  Physical Exam Gen: NAD, alert, cooperative with exam, well-appearing ENT: normal lips, normal nasal mucosa,  Eye: normal EOM, normal conjunctiva and lids CV:  no edema, +2 pedal pulses, S1-S2, regular rhythm   Resp: no accessory muscle use, non-labored, clear to auscultation bilaterally GI: no masses or tenderness, no hernia, soft, nondistended  Skin: no rashes, no areas of induration  Neuro: normal tone, normal sensation to touch Psych:  normal insight, alert and oriented MSK:  Right shoulder: Normal active flexion and abduction. Normal internal and external rotation strength resistance. Normal empty can testing No ecchymosis or swelling  No significant tenderness to palpation of the bony prominences. Right ribs: No obvious step-off or ecchymosis. Significant Ernest palpation of the mid axillary region of the sixth through ninth ribs. No sternal tenderness to palpation. Right hip: Mild bruising in this area. Symptoms to palpation over the greater trochanter. Normal internal and rotation of the hip. Normal strength resistance with hip flexion. Some dimpling of the right greater trochanter and firmness overlying this region. Neurovascularly intact     ASSESSMENT & PLAN:   Right hip pain Concerned that she may have a Morel Lavallee lesion.  - We'll advise her to follow-up later this week for ultrasound and possible aspiration. May need to obtain an MRI.  Rib pain on right side Significant pain on exam. Possible for rib fracture. No suggestion of pneumothorax on exam today. - X-rays. - Meloxicam and tramadol for severe pain.  Acute pain of right shoulder No deficits on  exam. Strength is intact. Likely just a contusion - If no improvement can perform an ultrasound exam.

## 2017-02-02 ENCOUNTER — Telehealth: Payer: Self-pay | Admitting: Internal Medicine

## 2017-02-02 NOTE — Telephone Encounter (Signed)
Patient is calling to follow up on her x ray results. She has been informed Dr.Schmitz is out of office and will be back tomorrow. She would like to know if another doctor could read it today.

## 2017-02-03 NOTE — Telephone Encounter (Signed)
Spoke with patient about xray results.   Rosemarie Ax, MD [facility] 02/03/2017, 10:28 AM

## 2017-02-03 NOTE — Telephone Encounter (Signed)
Routing to dr schmitz, please advise, thanks

## 2017-02-08 ENCOUNTER — Encounter: Payer: Self-pay | Admitting: Family Medicine

## 2017-02-08 ENCOUNTER — Ambulatory Visit: Payer: Self-pay

## 2017-02-08 ENCOUNTER — Ambulatory Visit (INDEPENDENT_AMBULATORY_CARE_PROVIDER_SITE_OTHER): Payer: PPO | Admitting: Family Medicine

## 2017-02-08 ENCOUNTER — Other Ambulatory Visit: Payer: Self-pay

## 2017-02-08 VITALS — BP 118/86 | HR 72

## 2017-02-08 DIAGNOSIS — R0781 Pleurodynia: Secondary | ICD-10-CM

## 2017-02-08 DIAGNOSIS — S7001XA Contusion of right hip, initial encounter: Secondary | ICD-10-CM | POA: Diagnosis not present

## 2017-02-08 MED ORDER — DICLOFENAC SODIUM 2 % TD SOLN: 2.0000 g | Freq: Two times a day (BID) | TRANSDERMAL | 3 refills | Status: DC

## 2017-02-08 NOTE — Progress Notes (Signed)
[facility] Jasmine Bray, [place] [address] Phone: [phone] Subjective:     CC: Right hip pain   [id number]  Jasmine Bray is a 66 y.o. female coming in for right sided rib pain from a fall 1 week ago when she fell off her horse. No problems breathing but her rib pain is improving. She is also having hip pain from the fall. She has some swelling over the GT.  Patient was seen previously by another individual and was told to have a hematoma.  Patient was seen about a week ago.  Was to increase activity slowly.  Patient states that is improving but very slowly.  Still waking her up at night.  Continued to have pain in her ribs as well.  Denies any shortness of breath, any abdominal pain out of the ordinary, any change in bowel or bladder.     Past Medical History:  Diagnosis Date  . Allergy    RHINITIS  . Anxiety 1989   Dr Jake Michaelis, [place]  . Depression    Dr Toy Care  . Fibroids   . GERD 11/11/2009   Qualifier: Diagnosis of  By: Linna Darner MD, Gwyndolyn Saxon   Dysphagia once monthly on average 4-10/14 ; resolved with Prilosec OTC prn No PMH of endoscopy    . GERD (gastroesophageal reflux disease)   . Hyperlipidemia   . Hypertension    Past Surgical History:  Procedure Laterality Date  . ABDOMINAL HYSTERECTOMY     & USO  . ANAL FISTULA ECTOMY      X 2, Dr Rosana Hoes  . COLONOSCOPY  2004, 2015   Carlean Purl  . MYOMECTOMY     FOR FIBROIDS, Dr Ubaldo Glassing  . steroid injection shoulder  05/11/2013   contusion/strain; Dr Onnie Graham   Social History   Socioeconomic History  . Marital status: Divorced    Spouse name: None  . Number of children: None  . Years of education: None  . Highest education level: None  Social Needs  . Financial resource strain: None  . Food insecurity - worry: None  . Food insecurity - inability: None  . Transportation needs - medical: None  . Transportation needs - non-medical: None  Occupational History  . Occupation: Press photographer  Tobacco Use   . Smoking status: Never Smoker  . Smokeless tobacco: Never Used  Substance and Sexual Activity  . Alcohol use: Yes    Comment: 3-4/ day five nights a week  . Drug use: No  . Sexual activity: None  Other Topics Concern  . None  Social History Narrative   GETS REG EXERCISE         No Known Allergies Family History  Problem Relation Age of Onset  . Arthritis Mother   . Stroke Mother 58  . Alcohol abuse Father   . Hyperlipidemia Sister   . Hypertension Sister   . Atrial fibrillation Sister   . Breast cancer Paternal Aunt   . Diabetes Maternal Grandmother   . Heart disease Paternal Grandmother        CAD; no MI  . Stroke Paternal Grandmother        in 92s  . Stroke Paternal Grandfather        in late 64s  . Colon cancer Neg Hx      Past medical history, social, surgical and family history all reviewed in electronic medical record.  No pertanent information unless stated regarding to the chief complaint.   Review of Systems:Review  of systems updated and as accurate as of 02/08/17  No headache, visual changes, nausea, vomiting, diarrhea, constipation, dizziness, abdominal pain, skin rash, fevers, chills, night sweats, weight loss, swollen lymph nodes, body aches, joint swelling, chest pain, shortness of breath, mood changes.  Positive muscle aches Objective  Blood pressure 118/86, pulse 72, SpO2 96 %. Systems examined below as of 02/08/17   General: No apparent distress alert and oriented x3 mood and affect normal, dressed appropriately.  HEENT: Pupils equal, extraocular movements intact  Respiratory: Patient's speak in full sentences and does not appear short of breath  Cardiovascular: No lower extremity edema, non tender, no erythema  Skin: Warm dry intact with no signs of infection or rash on extremities or on axial skeleton.  Abdomen: Soft nontender  Neuro: Cranial nerves II through XII are intact, neurovascularly intact in all extremities with 2+ DTRs and 2+ pulses.    Lymph: No lymphadenopathy of posterior or anterior cervical chain or axillae bilaterally.  Gait normal with good balance and coordination.  MSK:  Non tender with full range of motion and good stability and symmetric strength and tone of shoulders, elbows, wrist, knee and ankles bilaterally.  Right hip exam shows the patient does have a large contusion on the lateral aspect of the hip.  Mild fluctuance noted.  No true palpable fluid collection noted though.  Patient does have a positive Corky Sox.  Negative straight leg test.  Tender over the bruise itself but not over the bone.  Limited musculoskeletal ultrasound was performed and interpreted by Lyndal Pulley  Limited ultrasound of patient's right contusion shows that there is all soft tissue.  No bony abnormality noted.  Patient's muscles seemed to be all right with no increasing in vascularity.  Patient does not have any fluid collection of the lateral hip. Impression: Hip contusion   Impression and Recommendations:     This case required medical decision making of moderate complexity.      Note: This dictation was prepared with Dragon dictation along with smaller phrase technology. Any transcriptional errors that result from this process are unintentional.

## 2017-02-08 NOTE — Assessment & Plan Note (Signed)
Patient has a contusion of the right hip.  Seems to be localized.  Only in the soft tissue.  No fluid accumulation so no aspiration necessary.  Discussed potential compression, given topical anti-inflammatories, we discussed icing regimen.  Follow-up again in 2-3 weeks to make sure completely resolved.

## 2017-02-08 NOTE — Patient Instructions (Signed)
Good to see you.  Ice 20 minutes 2 times daily. Usually after activity and before bed. pennsaid pinkie amount topically 2 times daily as needed.   Arnica lotion over the counter can help the bruising as well  Compression shorts with ridingg could help  Ibuprofen as needed See em again in 2-3 weeks if pain not resolved.  OK to ride just don't fall ;)

## 2017-02-10 ENCOUNTER — Encounter: Payer: Self-pay | Admitting: Internal Medicine

## 2017-02-10 ENCOUNTER — Encounter: Payer: Self-pay | Admitting: Family Medicine

## 2017-03-04 ENCOUNTER — Telehealth: Payer: Self-pay | Admitting: *Deleted

## 2017-03-04 MED ORDER — MELOXICAM 15 MG PO TABS: 15.0000 mg | ORAL_TABLET | Freq: Every day | ORAL | 0 refills | Status: DC

## 2017-03-04 NOTE — Telephone Encounter (Signed)
Rx sent to [facility] pharmacy.  

## 2017-04-01 ENCOUNTER — Telehealth: Payer: Self-pay | Admitting: *Deleted

## 2017-04-01 MED ORDER — MELOXICAM 15 MG PO TABS: 15.0000 mg | ORAL_TABLET | Freq: Every day | ORAL | 0 refills | Status: DC

## 2017-04-04 NOTE — Telephone Encounter (Signed)
RX for Mobic sent in

## 2017-04-21 ENCOUNTER — Encounter: Payer: Self-pay | Admitting: Internal Medicine

## 2017-05-20 ENCOUNTER — Ambulatory Visit (INDEPENDENT_AMBULATORY_CARE_PROVIDER_SITE_OTHER): Payer: PPO | Admitting: Internal Medicine

## 2017-05-20 ENCOUNTER — Encounter: Payer: Self-pay | Admitting: Internal Medicine

## 2017-05-20 VITALS — BP 124/76 | HR 81 | Temp 98.3°F | Resp 16

## 2017-05-20 DIAGNOSIS — M25562 Pain in left knee: Secondary | ICD-10-CM | POA: Diagnosis not present

## 2017-05-20 DIAGNOSIS — S76319A Strain of muscle, fascia and tendon of the posterior muscle group at thigh level, unspecified thigh, initial encounter: Secondary | ICD-10-CM | POA: Insufficient documentation

## 2017-05-20 MED ORDER — DICLOFENAC SODIUM 1 % TD GEL: 4.0000 g | Freq: Four times a day (QID) | TRANSDERMAL | 5 refills | Status: DC

## 2017-05-20 MED ORDER — HYDROCODONE-ACETAMINOPHEN 5-325 MG PO TABS: 1.0000 | ORAL_TABLET | Freq: Four times a day (QID) | ORAL | 0 refills | Status: DC | PRN

## 2017-05-20 MED FILL — HYDROCODON-APAP 5-325: 5-325 | 2 days supply | Qty: 20 | Fill #0

## 2017-05-20 MED FILL — DICLOFENAC SODIUM 1% GEL: 1 | 6 days supply | Qty: 100 | Fill #0

## 2017-05-20 NOTE — Progress Notes (Signed)
Subjective:    Patient ID: Jasmine Bray, female    DOB: 09-Jan-1951, 67 y.o.   MRN: [id number]  HPI The patient is here for an acute visit.  Monday she was exercising and she was doing squat jumps and she did more than she probably should have.  Initially she had no pain, but when she got into her car to go home she started experiencing left knee pain.  She denied any swelling at that time.  The pain was getting better over the week, but last night she was in the barn and she stepped back and she thinks the ground was uneven and she heard a pop and had severe pain.  The pain is primarily located in the posterior knee.  She has been using some pen said topically and that has helped.  She is also been taking Advil or Aleve.  It hurts to put any pressure on the knee.  She is able to walk more comfortably if she inverts her toe and when she walks.  She denies any knee swelling or bruising.  She has decreased flexion, but full extension.  She denies any calf tenderness, numbness or tingling.    Medications and allergies reviewed with patient and updated if appropriate.  Patient Active Problem List   Diagnosis Date Noted  . Contusion of right hip 02/08/2017  . Rib pain on right side 02/01/2017  . Right hip pain 02/01/2017  . Left upper arm pain 03/17/2016  . Hepatitis C antibody test positive 01/29/2016  . Tachycardia 11/06/2015  . Acute pain of right shoulder 04/24/2013  . Fasting hyperglycemia 01/10/2012  . Hyperlipidemia 11/11/2009  . 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 Essential hypertension 11/11/2009    Current Outpatient Medications on File Prior to Visit  Medication Sig Dispense Refill  . ALPRAZolam (XANAX) 1 MG tablet Take 1 mg by mouth as needed.    Marland Kitchen aspirin EC 81 MG tablet Take 81 mg by mouth daily.    Marland Kitchen b complex vitamins tablet Take 1 tablet by mouth daily.    . Biotin 1 MG CAPS Take 1 tablet by mouth daily.    Marland Kitchen buPROPion (WELLBUTRIN XL) 150 MG 24 hr tablet  Take 150 mg by mouth daily.    . Cholecalciferol (VITAMIN D3) 3000 UNITS TABS Take 1 capsule by mouth daily. Taking 1000 units daily    . Diclofenac Sodium 2 % SOLN Place 2 g 2 (two) times daily onto the skin. 112 g 3  . escitalopram (LEXAPRO) 20 MG tablet Take 10 mg by mouth daily.     Marland Kitchen estradiol (ESTRACE) 0.1 MG/GM vaginal cream Place 2 g vaginally. 1 GRAM 3x A WEEK     . estradiol (EVAMIST) 1.53 MG/SPRAY transdermal spray Place 1 spray onto the skin daily.    . Garlic 0347 MG CAPS Take 1 capsule by mouth daily.    . hydrochlorothiazide (HYDRODIURIL) 25 MG tablet Take 1 tablet (25 mg total) by mouth daily. 90 tablet 3  . meloxicam (MOBIC) 15 MG tablet Take 1 tablet (15 mg total) by mouth daily. 90 tablet 0  . Misc Natural Products (COSAMIN ASU ADVANCED FORMULA PO) Take by mouth 2 (two) times daily.    . Multiple Vitamin (MULTIVITAMIN) tablet Take 1 tablet by mouth daily.    . Omega-3 Fatty Acids (FISH OIL) 1000 MG CAPS Take by mouth daily.    . Probiotic Product (PROBIOTIC PEARLS PO) Take 1 tablet by mouth daily.    Marland Kitchen  simvastatin (ZOCOR) 40 MG tablet Take 1 tablet (40 mg total) by mouth at bedtime. 90 tablet 3  . traMADol (ULTRAM) 50 MG tablet Take 1 tablet (50 mg total) every 8 (eight) hours as needed by mouth. 12 tablet 0  . Turmeric 500 MG CAPS Take 500 mg by mouth daily.    . valACYclovir (VALTREX) 1000 MG tablet Take 1,000 mg by mouth. 1/2 by mouth once daily     No current facility-administered medications on file prior to visit.     Past Medical History:  Diagnosis Date  . Allergy    RHINITIS  . Anxiety 1989   Dr Jake Michaelis, [place]  . Depression    Dr Toy Care  . Fibroids   . GERD 11/11/2009   Qualifier: Diagnosis of  By: Linna Darner MD, Gwyndolyn Saxon   Dysphagia once monthly on average 4-10/14 ; resolved with Prilosec OTC prn No PMH of endoscopy    . GERD (gastroesophageal reflux disease)   . Hyperlipidemia   . Hypertension     Past Surgical History:  Procedure Laterality Date  . ABDOMINAL  HYSTERECTOMY     & USO  . ANAL FISTULA ECTOMY      X 2, Dr Rosana Hoes  . COLONOSCOPY  2004, 2015   Carlean Purl  . MYOMECTOMY     FOR FIBROIDS, Dr Ubaldo Glassing  . steroid injection shoulder  05/11/2013   contusion/strain; Dr Onnie Graham    Social History   Socioeconomic History  . Marital status: Divorced    Spouse name: None  . Number of children: None  . Years of education: None  . Highest education level: None  Social Needs  . Financial resource strain: None  . Food insecurity - worry: None  . Food insecurity - inability: None  . Transportation needs - medical: None  . Transportation needs - non-medical: None  Occupational History  . Occupation: Press photographer  Tobacco Use  . Smoking status: Never Smoker  . Smokeless tobacco: Never Used  Substance and Sexual Activity  . Alcohol use: Yes    Comment: 3-4/ day five nights a week  . Drug use: No  . Sexual activity: None  Other Topics Concern  . None  Social History Narrative   GETS REG EXERCISE          Family History  Problem Relation Age of Onset  . Arthritis Mother   . Stroke Mother 73  . Alcohol abuse Father   . Hyperlipidemia Sister   . Hypertension Sister   . Atrial fibrillation Sister   . Breast cancer Paternal Aunt   . Diabetes Maternal Grandmother   . Heart disease Paternal Grandmother        CAD; no MI  . Stroke Paternal Grandmother        in 71s  . Stroke Paternal Grandfather        in late 67s  . Colon cancer Neg Hx     Review of Systems  Constitutional: Negative for chills and fever.  Musculoskeletal: Positive for arthralgias. Negative for joint swelling.  Skin: Negative for color change.  Neurological: Positive for weakness. Negative for numbness.       Objective:   Vitals:   05/20/17 1338  BP: 124/76  Pulse: 81  Resp: 16  Temp: 98.3 F (36.8 C)  SpO2: 95%   Wt Readings from Last 3 Encounters:  02/01/17 188 lb (85.3 kg)  01/24/17 188 lb (85.3 kg)  10/21/16 182 lb (82.6 kg)   There is no height or  weight  on file to calculate BMI.   Physical Exam    A left knee exam was performed.   SKIN: intact, no bruising SWELLING: mild in posterior knee EFFUSION: no  WARMTH: no warmth  TENDERNESS: moderate tenderness in posterior aspect of knee  ROM: full extension, decreased flexion  GAIT: walks with a limp - unable to fully bear weight on right leg  NEUROLOGICAL EXAM: normal sensation  CALF TENDERNESS: no        Assessment & Plan:    See Problem List for Assessment and Plan of chronic medical problems.

## 2017-05-20 NOTE — Assessment & Plan Note (Addendum)
Acute left knee pain-pain in posterior aspect of the knee primarily Possible Baker's cyst Continue pen said-this is very expensive so I did send a prescription for diclofenac, which hopefully is cheaper Will take meloxicam daily Small prescription of Vicodin given for severe pain-we will use only as needed-she is aware of possible side effects Try icing the knee alternating with heating Revised activities, elevate when sitting We will see sports medicine next Monday for further evaluation and treatment

## 2017-05-20 NOTE — Patient Instructions (Addendum)
Make an appointment with sports medicine.    Use the pennsaid or diclofenac gel topically, meloxicam daily.  Use the vicodin as needed for severe pain.   Elevate the leg.  Alternate ice and heat.

## 2017-05-23 ENCOUNTER — Ambulatory Visit (INDEPENDENT_AMBULATORY_CARE_PROVIDER_SITE_OTHER): Payer: PPO | Admitting: Family Medicine

## 2017-05-23 ENCOUNTER — Encounter: Payer: Self-pay | Admitting: Family Medicine

## 2017-05-23 ENCOUNTER — Ambulatory Visit: Payer: PPO | Admitting: Family Medicine

## 2017-05-23 DIAGNOSIS — S76312A Strain of muscle, fascia and tendon of the posterior muscle group at thigh level, left thigh, initial encounter: Secondary | ICD-10-CM

## 2017-05-23 NOTE — Progress Notes (Signed)
Jasmine Bray - 67 y.o. female MRN [id number]  Date of birth: 1950/11/07  SUBJECTIVE:  Including CC & ROS.  Chief Complaint  Patient presents with  . Left knee pain    Jasmine Bray is a 67 y.o. female that is presenting with left knee pain. Jasmine Bray has been ongoing one week ago. She noticed the pain after completing squat jumps in her exercise class. Located posteriorly and radiates down the back of her leg. Denies tingling or numbness. She states the pain was improving till she was working in her barn and lost her balance and stepped to catch her balance. Admits to swelling and tenderness. She has been applying Pennsaid and taking Vicodin with some improvement. Pain is mild in nature, worse upon flexion.   Review of Systems  Constitutional: Negative for fever.  HENT: Negative for ear pain.   Respiratory: Negative for cough.   Cardiovascular: Negative for chest pain.  Gastrointestinal: Negative for abdominal pain.  Musculoskeletal: Positive for gait problem.  Skin: Negative for color change.  Neurological: Negative for weakness.  Psychiatric/Behavioral: Negative for agitation.    HISTORY: Past Medical, Surgical, Social, and Family History Reviewed & Updated per EMR.   Pertinent Historical Findings include:  Past Medical History:  Diagnosis Date  . Allergy    RHINITIS  . Anxiety 1989   Jasmine Jasmine Bray, [place]  . Depression    Jasmine Jasmine Bray  . Fibroids   . GERD 11/11/2009   Qualifier: Diagnosis of  By: Jasmine Bray   Dysphagia once monthly on average 4-10/14 ; resolved with Prilosec OTC prn No PMH of endoscopy    . GERD (gastroesophageal reflux disease)   . Hyperlipidemia   . Hypertension     Past Surgical History:  Procedure Laterality Date  . ABDOMINAL HYSTERECTOMY     & USO  . ANAL FISTULA ECTOMY      X 2, Jasmine Bray  . COLONOSCOPY  2004, 2015   Jasmine Bray  . MYOMECTOMY     FOR FIBROIDS, Jasmine Jasmine Bray  . steroid injection shoulder  05/11/2013   contusion/strain; Jasmine Jasmine Bray    No  Known Allergies  Family History  Problem Relation Age of Onset  . Arthritis Mother   . Stroke Mother 34  . Alcohol abuse Father   . Hyperlipidemia Sister   . Hypertension Sister   . Atrial fibrillation Sister   . Breast cancer Paternal Aunt   . Diabetes Maternal Grandmother   . Heart disease Paternal Grandmother        CAD; no MI  . Stroke Paternal Grandmother        in 41s  . Stroke Paternal Grandfather        in late 76s  . Colon cancer Neg Hx      Social History   Socioeconomic History  . Marital status: Divorced    Spouse name: Not on file  . Number of children: Not on file  . Years of education: Not on file  . Highest education level: Not on file  Social Needs  . Financial resource strain: Not on file  . Food insecurity - worry: Not on file  . Food insecurity - inability: Not on file  . Transportation needs - medical: Not on file  . Transportation needs - non-medical: Not on file  Occupational History  . Occupation: Press photographer  Tobacco Use  . Smoking status: Never Smoker  . Smokeless tobacco: Never Used  Substance and Sexual Activity  . Alcohol use: Yes  Comment: 3-4/ day five nights a week  . Drug use: No  . Sexual activity: Not on file  Other Topics Concern  . Not on file  Social History Narrative   GETS REG EXERCISE           PHYSICAL EXAM:  VS: BP 128/74 (BP Location: Left Arm, Patient Position: Sitting, Cuff Size: Normal)   Pulse 73   Temp 98 F (36.7 C) (Oral)   Ht 5\' 7"  (1.702 m)   SpO2 95%   BMI 29.44 kg/m  Physical Exam Gen: NAD, alert, cooperative with exam, well-appearing ENT: normal lips, normal nasal mucosa,  Eye: normal EOM, normal conjunctiva and lids CV:  no edema, +2 pedal pulses   Resp: no accessory muscle use, non-labored,  Skin: no rashes, no areas of induration  Neuro: normal tone, normal sensation to touch Psych:  normal insight, alert and oriented MSK:  Left knee: No obvious effusion. Limited flexion with normal  extension. No tenderness to palpation along the medial or lateral joint line. Weakness with resistance to knee flexion. Normal strength resistance with knee extension. Ambulating with a limp. Neurovascular intact  Limited ultrasound: Left knee:  Moderate effusion within the suprapatellar pouch. Joint space narrowing in the medial lateral compartment with mild outpouching of the meniscus. No Baker's cyst. Biceps femoris doesn't demonstrate a tear  Summary: Chronic effusion and degenerative changes of the joint line.   Ultrasound and interpretation by Clearance Coots, MD      ASSESSMENT & PLAN:   Hamstring strain Pain is likely associated with a hamstring strain. Does not appear to have any rupture. Possibly she may have a component of meniscal injury. Does have an effusion on her knee but this appears to be a chronic. - Counseled on supportive Bray - Counseled on home exercise therapy - Placed Ace wrap today - If no improvement could consider knee injection and imaging

## 2017-05-23 NOTE — Progress Notes (Deleted)
Jasmine Bray - 67 y.o. female MRN [id number]  Date of birth: [birth year]  SUBJECTIVE:  Including CC & ROS.  No chief complaint on file.   Jasmine Bray is a 67 y.o. female that is  ***.  ***   Review of Systems  HISTORY: Past Medical, Surgical, Social, and Family History Reviewed & Updated per EMR.   Pertinent Historical Findings include:  Past Medical History:  Diagnosis Date  . Allergy    RHINITIS  . Anxiety 1989   Dr Jake Michaelis, [place]  . Depression    Dr Toy Care  . Fibroids   . GERD 11/11/2009   Qualifier: Diagnosis of  By: Linna Darner MD, Gwyndolyn Saxon   Dysphagia once monthly on average 4-10/14 ; resolved with Prilosec OTC prn No PMH of endoscopy    . GERD (gastroesophageal reflux disease)   . Hyperlipidemia   . Hypertension     Past Surgical History:  Procedure Laterality Date  . ABDOMINAL HYSTERECTOMY     & USO  . ANAL FISTULA ECTOMY      X 2, Dr Rosana Hoes  . COLONOSCOPY  2004, 2015   Carlean Purl  . MYOMECTOMY     FOR FIBROIDS, Dr Ubaldo Glassing  . steroid injection shoulder  05/11/2013   contusion/strain; Dr Onnie Graham    No Known Allergies  Family History  Problem Relation Age of Onset  . Arthritis Mother   . Stroke Mother 46  . Alcohol abuse Father   . Hyperlipidemia Sister   . Hypertension Sister   . Atrial fibrillation Sister   . Breast cancer Paternal Aunt   . Diabetes Maternal Grandmother   . Heart disease Paternal Grandmother        CAD; no MI  . Stroke Paternal Grandmother        in 63s  . Stroke Paternal Grandfather        in late 46s  . Colon cancer Neg Hx      Social History   Socioeconomic History  . Marital status: Divorced    Spouse name: Not on file  . Number of children: Not on file  . Years of education: Not on file  . Highest education level: Not on file  Social Needs  . Financial resource strain: Not on file  . Food insecurity - worry: Not on file  . Food insecurity - inability: Not on file  . Transportation needs - medical: Not on file  .  Transportation needs - non-medical: Not on file  Occupational History  . Occupation: Press photographer  Tobacco Use  . Smoking status: Never Smoker  . Smokeless tobacco: Never Used  Substance and Sexual Activity  . Alcohol use: Yes    Comment: 3-4/ day five nights a week  . Drug use: No  . Sexual activity: Not on file  Other Topics Concern  . Not on file  Social History Narrative   GETS REG EXERCISE           PHYSICAL EXAM:  VS: There were no vitals taken for this visit. Physical Exam Gen: NAD, alert, cooperative with exam, well-appearing ENT: normal lips, normal nasal mucosa,  Eye: normal EOM, normal conjunctiva and lids CV:  no edema, +2 pedal pulses   Resp: no accessory muscle use, non-labored,  GI: no masses or tenderness, no hernia  Skin: no rashes, no areas of induration  Neuro: normal tone, normal sensation to touch Psych:  normal insight, alert and oriented MSK:  ***      ASSESSMENT & PLAN:  No problem-specific Assessment & Plan notes found for this encounter.

## 2017-05-23 NOTE — Patient Instructions (Signed)
Please try to use an Ace wrap to wrap the knee. I wouldl try to wear this to the course of the day until you're able to walk normally. Please try the exercises that I have provided once her pain is less than a 2 out of 10. Please try alternating heat and ice. Please follow-up with me in 4 weeks if he had no improvement.

## 2017-05-23 NOTE — Assessment & Plan Note (Signed)
Pain is likely associated with a hamstring strain. Does not appear to have any rupture. Possibly she may have a component of meniscal injury. Does have an effusion on her knee but this appears to be a chronic. - Counseled on supportive care - Counseled on home exercise therapy - Placed Ace wrap today - If no improvement could consider knee injection and imaging

## 2017-07-22 ENCOUNTER — Ambulatory Visit: Payer: PPO | Admitting: Family Medicine

## 2017-07-22 NOTE — Progress Notes (Deleted)
Jasmine Bray - 67 y.o. female MRN [id number]  Date of birth: 03-25-1951  SUBJECTIVE:  Including CC & ROS.  No chief complaint on file.   Jasmine Bray is a 67 y.o. female that is  ***.  ***   Review of Systems  HISTORY: Past Medical, Surgical, Social, and Family History Reviewed & Updated per EMR.   Pertinent Historical Findings include:  Past Medical History:  Diagnosis Date  . Allergy    RHINITIS  . Anxiety 1989   Dr Jake Michaelis, [place]  . Depression    Dr Toy Care  . Fibroids   . GERD 11/11/2009   Qualifier: Diagnosis of  By: Linna Darner MD, Gwyndolyn Saxon   Dysphagia once monthly on average 4-10/14 ; resolved with Prilosec OTC prn No PMH of endoscopy    . GERD (gastroesophageal reflux disease)   . Hyperlipidemia   . Hypertension     Past Surgical History:  Procedure Laterality Date  . ABDOMINAL HYSTERECTOMY     & USO  . ANAL FISTULA ECTOMY      X 2, Dr Rosana Hoes  . COLONOSCOPY  2004, 2015   Carlean Purl  . MYOMECTOMY     FOR FIBROIDS, Dr Ubaldo Glassing  . steroid injection shoulder  05/11/2013   contusion/strain; Dr Onnie Graham    No Known Allergies  Family History  Problem Relation Age of Onset  . Arthritis Mother   . Stroke Mother 69  . Alcohol abuse Father   . Hyperlipidemia Sister   . Hypertension Sister   . Atrial fibrillation Sister   . Breast cancer Paternal Aunt   . Diabetes Maternal Grandmother   . Heart disease Paternal Grandmother        CAD; no MI  . Stroke Paternal Grandmother        in 46s  . Stroke Paternal Grandfather        in late 45s  . Colon cancer Neg Hx      Social History   Socioeconomic History  . Marital status: Divorced    Spouse name: Not on file  . Number of children: Not on file  . Years of education: Not on file  . Highest education level: Not on file  Occupational History  . Occupation: Press photographer  Social Needs  . Financial resource strain: Not on file  . Food insecurity:    Worry: Not on file    Inability: Not on file  . Transportation needs:   Medical: Not on file    Non-medical: Not on file  Tobacco Use  . Smoking status: Never Smoker  . Smokeless tobacco: Never Used  Substance and Sexual Activity  . Alcohol use: Yes    Comment: 3-4/ day five nights a week  . Drug use: No  . Sexual activity: Not on file  Lifestyle  . Physical activity:    Days per week: Not on file    Minutes per session: Not on file  . Stress: Not on file  Relationships  . Social connections:    Talks on phone: Not on file    Gets together: Not on file    Attends religious service: Not on file    Active member of club or organization: Not on file    Attends meetings of clubs or organizations: Not on file    Relationship status: Not on file  . Intimate partner violence:    Fear of current or ex partner: Not on file    Emotionally abused: Not on file    Physically  abused: Not on file    Forced sexual activity: Not on file  Other Topics Concern  . Not on file  Social History Narrative   GETS REG EXERCISE           PHYSICAL EXAM:  VS: There were no vitals taken for this visit. Physical Exam Gen: NAD, alert, cooperative with exam, well-appearing ENT: normal lips, normal nasal mucosa,  Eye: normal EOM, normal conjunctiva and lids CV:  no edema, +2 pedal pulses   Resp: no accessory muscle use, non-labored,  GI: no masses or tenderness, no hernia  Skin: no rashes, no areas of induration  Neuro: normal tone, normal sensation to touch Psych:  normal insight, alert and oriented MSK:  ***      ASSESSMENT & PLAN:   No problem-specific Assessment & Plan notes found for this encounter.

## 2017-07-25 ENCOUNTER — Ambulatory Visit (INDEPENDENT_AMBULATORY_CARE_PROVIDER_SITE_OTHER): Payer: PPO

## 2017-07-25 ENCOUNTER — Encounter: Payer: Self-pay | Admitting: Family Medicine

## 2017-07-25 ENCOUNTER — Ambulatory Visit (INDEPENDENT_AMBULATORY_CARE_PROVIDER_SITE_OTHER): Payer: PPO | Admitting: Family Medicine

## 2017-07-25 VITALS — BP 122/64 | HR 67 | Temp 98.2°F | Ht 67.0 in

## 2017-07-25 DIAGNOSIS — G8929 Other chronic pain: Secondary | ICD-10-CM | POA: Insufficient documentation

## 2017-07-25 DIAGNOSIS — M1712 Unilateral primary osteoarthritis, left knee: Secondary | ICD-10-CM | POA: Diagnosis not present

## 2017-07-25 DIAGNOSIS — M25569 Pain in unspecified knee: Secondary | ICD-10-CM

## 2017-07-25 DIAGNOSIS — M25562 Pain in left knee: Secondary | ICD-10-CM | POA: Diagnosis not present

## 2017-07-25 DIAGNOSIS — M25462 Effusion, left knee: Secondary | ICD-10-CM

## 2017-07-25 NOTE — Assessment & Plan Note (Signed)
Appears to have some degenerative changes and patellar tendinitis  - intra-articular aspiration  - provided pennsaid  - counseled on HEP  - xray  - If no improvement consider nitro patches or PT

## 2017-07-25 NOTE — Progress Notes (Signed)
Jasmine Bray - 67 y.o. female MRN [id number]  Date of birth: Nov 08, 1950  SUBJECTIVE:  Including CC & ROS.  Chief Complaint  Patient presents with  . Left knee pain    Jasmine Bray is a 67 y.o. female that is presenting with left knee pain. Ongoing for two months, located diffusely throughout her knee radiates towards the medial aspect. Pain is mild to severe upon flexion. Admits to swelling and tenderness. She has received prior injections in her right. She notices the pain more after she gets off riding her horses. She has been taking Mobic for the pain. She feels likes her knee is weak. Denies injury or surgeries.      Review of Systems  Constitutional: Negative for fever.  HENT: Negative for congestion.   Respiratory: Negative for cough.   Cardiovascular: Negative for chest pain.  Gastrointestinal: Negative for abdominal pain.  Musculoskeletal: Positive for arthralgias.  Skin: Negative for color change.  Neurological: Negative for weakness.  Hematological: Negative for adenopathy.  Psychiatric/Behavioral: Negative for agitation.    HISTORY: Past Medical, Surgical, Social, and Family History Reviewed & Updated per EMR.   Pertinent Historical Findings include:  Past Medical History:  Diagnosis Date  . Allergy    RHINITIS  . Anxiety 1989   Dr Jake Michaelis, [place]  . Depression    Dr Toy Care  . Fibroids   . GERD 11/11/2009   Qualifier: Diagnosis of  By: Linna Darner MD, Gwyndolyn Saxon   Dysphagia once monthly on average 4-10/14 ; resolved with Prilosec OTC prn No PMH of endoscopy    . GERD (gastroesophageal reflux disease)   . Hyperlipidemia   . Hypertension     Past Surgical History:  Procedure Laterality Date  . ABDOMINAL HYSTERECTOMY     & USO  . ANAL FISTULA ECTOMY      X 2, Dr Rosana Hoes  . COLONOSCOPY  2004, 2015   Carlean Purl  . MYOMECTOMY     FOR FIBROIDS, Dr Ubaldo Glassing  . steroid injection shoulder  05/11/2013   contusion/strain; Dr Onnie Graham    No Known Allergies  Family History    Problem Relation Age of Onset  . Arthritis Mother   . Stroke Mother 7  . Alcohol abuse Father   . Hyperlipidemia Sister   . Hypertension Sister   . Atrial fibrillation Sister   . Breast cancer Paternal Aunt   . Diabetes Maternal Grandmother   . Heart disease Paternal Grandmother        CAD; no MI  . Stroke Paternal Grandmother        in 87s  . Stroke Paternal Grandfather        in late 63s  . Colon cancer Neg Hx      Social History   Socioeconomic History  . Marital status: Divorced    Spouse name: Not on file  . Number of children: Not on file  . Years of education: Not on file  . Highest education level: Not on file  Occupational History  . Occupation: Press photographer  Social Needs  . Financial resource strain: Not on file  . Food insecurity:    Worry: Not on file    Inability: Not on file  . Transportation needs:    Medical: Not on file    Non-medical: Not on file  Tobacco Use  . Smoking status: Never Smoker  . Smokeless tobacco: Never Used  Substance and Sexual Activity  . Alcohol use: Yes    Comment: 3-4/ day five nights a  week  . Drug use: No  . Sexual activity: Not on file  Lifestyle  . Physical activity:    Days per week: Not on file    Minutes per session: Not on file  . Stress: Not on file  Relationships  . Social connections:    Talks on phone: Not on file    Gets together: Not on file    Attends religious service: Not on file    Active member of club or organization: Not on file    Attends meetings of clubs or organizations: Not on file    Relationship status: Not on file  . Intimate partner violence:    Fear of current or ex partner: Not on file    Emotionally abused: Not on file    Physically abused: Not on file    Forced sexual activity: Not on file  Other Topics Concern  . Not on file  Social History Narrative   GETS REG EXERCISE           PHYSICAL EXAM:  VS: BP 122/64 (BP Location: Left Arm, Patient Position: Sitting, Cuff Size:  Normal)   Pulse 67   Temp 98.2 F (36.8 C) (Oral)   Ht 5\' 7"  (1.702 m)   SpO2 95%   BMI 29.44 kg/m  Physical Exam Gen: NAD, alert, cooperative with exam, well-appearing ENT: normal lips, normal nasal mucosa,  Eye: normal EOM, normal conjunctiva and lids CV:  no edema, +2 pedal pulses   Resp: no accessory muscle use, non-labored,  GI: no masses or tenderness, no hernia  Skin: no rashes, no areas of induration  Neuro: normal tone, normal sensation to touch Psych:  normal insight, alert and oriented MSK:  Left knee:  Mild effusion  TTP of the medial and lateral joint  TTP of the patellar tendon  Normal flexion and extension  No instability  Negative McMurray's test  Normal gait   Limited ultrasound: left knee:  Mild to moderate effusion in the SPP  Medial joint space narrowing  Increased vascularity at the medial border of the patellar tendon. Patellar tendon with no tendinopathy type changes Normal appearing lateral joint space   Summary: mild degenerative changes medially and findings suggestive of patellar tendinitis.   Ultrasound and interpretation by Clearance Coots, MD    Aspiration/Injection Procedure Note Jasmine Bray 08-01-1950  Procedure: Injection Indications: left knee pain   Procedure Details Consent: Risks of procedure as well as the alternatives and risks of each were explained to the (patient/caregiver).  Consent for procedure obtained. Time Out: Verified patient identification, verified procedure, site/side was marked, verified correct patient position, special equipment/implants available, medications/allergies/relevent history reviewed, required imaging and test results available.  Performed.  The area was cleaned with iodine and alcohol swabs.    The left knee superiorlateral spp was aspirated and injected.  3 cc of 1% lidocaine without epinephrine was used to anesthetize the tract in the skin.  An 18-gauge needle 60 cc syringe was used to  aspirate.  The syringe was switched and an injection using 1 cc's of 40 mg/mL kenalog and 4 cc's of 0.25% bupivacaine with a 25 1 1/2" needle.  Ultrasound was used. Images were obtained in Transverse and Long views showing the injection.    Amount of Fluid Aspirated: 13mL Character of Fluid: bloody Fluid was sent [id number] count and crystal identification A sterile dressing was applied.  Patient did tolerate procedure well.      ASSESSMENT & PLAN:   Acute pain  of left knee Appears to have some degenerative changes and patellar tendinitis  - intra-articular aspiration  - provided pennsaid  - counseled on HEP  - xray  - If no improvement consider nitro patches or PT

## 2017-07-25 NOTE — Patient Instructions (Signed)
Good to see you!! Please ice your knee  I'd try compression if you can  Please follow up with me in 4-6 weeks if your pain doesn't improve

## 2017-07-26 ENCOUNTER — Telehealth: Payer: Self-pay | Admitting: Family Medicine

## 2017-07-26 LAB — SYNOVIAL CELL COUNT + DIFF, W/ CRYSTALS
Basophils, %: 0 %
Eosinophils-Synovial: 0 % (ref 0–2)
LYMPHOCYTES-SYNOVIAL FLD: 46 % (ref 0–74)
MONOCYTE/MACROPHAGE: 30 % (ref 0–69)
NEUTROPHIL, SYNOVIAL: 24 % (ref 0–24)
Synoviocytes, %: 0 % (ref 0–15)
WBC, SYNOVIAL: 80 {cells}/uL (ref <150)

## 2017-07-26 NOTE — Telephone Encounter (Signed)
Spoke with patient about her results.   Rosemarie Ax, MD [facility] 07/26/2017, 5:30 PM

## 2017-07-29 ENCOUNTER — Other Ambulatory Visit: Payer: Self-pay | Admitting: Family Medicine

## 2017-09-13 NOTE — Progress Notes (Signed)
Jasmine Bray - 67 y.o. female MRN [id number]  Date of birth: 05/06/1950  SUBJECTIVE:  Including CC & ROS.  Chief Complaint  Patient presents with  . Follow-up    Jasmine Bray is a 67 y.o. female that is here today for left knee pain follow up. She was seen on 07/25/17 and her knee was 7ml drained and received an injection. She woke up two days ago with pain located in the same area previously drained. She denies any injury. She has been swimming and exercising recently. Pain and tenderness present.  Mild to severe and localized the knee.    Review of the left knee x-ray from 4/29 shows minimal degenerative changes in the medial compartment.   Review of Systems  Constitutional: Negative for fever.  HENT: Negative for congestion.   Respiratory: Negative for cough.   Cardiovascular: Negative for chest pain.  Gastrointestinal: Negative for abdominal pain.  Musculoskeletal: Negative for gait problem.  Skin: Negative for color change.    HISTORY: Past Medical, Surgical, Social, and Family History Reviewed & Updated per EMR.   Pertinent Historical Findings include:  Past Medical History:  Diagnosis Date  . Allergy    RHINITIS  . Anxiety 1989   Dr Jake Michaelis, [place]  . Depression    Dr Toy Care  . Fibroids   . GERD 11/11/2009   Qualifier: Diagnosis of  By: Linna Darner MD, Gwyndolyn Saxon   Dysphagia once monthly on average 4-10/14 ; resolved with Prilosec OTC prn No PMH of endoscopy    . GERD (gastroesophageal reflux disease)   . Hyperlipidemia   . Hypertension     Past Surgical History:  Procedure Laterality Date  . ABDOMINAL HYSTERECTOMY     & USO  . ANAL FISTULA ECTOMY      X 2, Dr Rosana Hoes  . COLONOSCOPY  2004, 2015   Carlean Purl  . MYOMECTOMY     FOR FIBROIDS, Dr Ubaldo Glassing  . steroid injection shoulder  05/11/2013   contusion/strain; Dr Onnie Graham    No Known Allergies  Family History  Problem Relation Age of Onset  . Arthritis Mother   . Stroke Mother 48  . Alcohol abuse Father   .  Hyperlipidemia Sister   . Hypertension Sister   . Atrial fibrillation Sister   . Breast cancer Paternal Aunt   . Diabetes Maternal Grandmother   . Heart disease Paternal Grandmother        CAD; no MI  . Stroke Paternal Grandmother        in 71s  . Stroke Paternal Grandfather        in late 60s  . Colon cancer Neg Hx      Social History   Socioeconomic History  . Marital status: Divorced    Spouse name: Not on file  . Number of children: Not on file  . Years of education: Not on file  . Highest education level: Not on file  Occupational History  . Occupation: Press photographer  Social Needs  . Financial resource strain: Not on file  . Food insecurity:    Worry: Not on file    Inability: Not on file  . Transportation needs:    Medical: Not on file    Non-medical: Not on file  Tobacco Use  . Smoking status: Never Smoker  . Smokeless tobacco: Never Used  Substance and Sexual Activity  . Alcohol use: Yes    Comment: 3-4/ day five nights a week  . Drug use: No  . Sexual activity:  Not on file  Lifestyle  . Physical activity:    Days per week: Not on file    Minutes per session: Not on file  . Stress: Not on file  Relationships  . Social connections:    Talks on phone: Not on file    Gets together: Not on file    Attends religious service: Not on file    Active member of club or organization: Not on file    Attends meetings of clubs or organizations: Not on file    Relationship status: Not on file  . Intimate partner violence:    Fear of current or ex partner: Not on file    Emotionally abused: Not on file    Physically abused: Not on file    Forced sexual activity: Not on file  Other Topics Concern  . Not on file  Social History Narrative   GETS REG EXERCISE           PHYSICAL EXAM:  VS: BP 134/76 (BP Location: Left Arm, Patient Position: Sitting, Cuff Size: Normal)   Pulse 88   Ht 5\' 7"  (1.702 m)   SpO2 98%   BMI 29.44 kg/m  Physical Exam Gen: NAD, alert,  cooperative with exam, well-appearing ENT: normal lips, normal nasal mucosa,  Eye: normal EOM, normal conjunctiva and lids CV:  no edema, +2 pedal pulses   Resp: no accessory muscle use, non-labored,  Skin: no rashes, no areas of induration  Neuro: normal tone, normal sensation to touch Psych:  normal insight, alert and oriented MSK:  Knee: Normal to inspection with no erythema or effusion or obvious bony abnormalities. TTP of the medial joint line  ROM full in flexion and extension and lower leg rotation. Ligaments with solid consistent endpoints including  LCL, MCL. Negative Mcmurray's tests. Non painful patellar compression. Patellar glide without crepitus. Patellar and quadriceps tendons unremarkable. Hamstring and quadriceps strength is normal.  Neurovascularly intact   Limited ultrasound: left knee:  Mild effusion in SPP  Medial joint space narrowing with outpouching of the medial meniscus  No significant vascular uptake of the PT   Summary: degenerative changes appreciated in the medial joint space   Ultrasound and interpretation by Clearance Coots, MD       ASSESSMENT & PLAN:   Chronic knee pain Pain today is likely related to degenerative changes. - counseled on ice and compression  - pennsaid samples  - counseled on HEP  - consider gel injections

## 2017-09-14 ENCOUNTER — Ambulatory Visit (INDEPENDENT_AMBULATORY_CARE_PROVIDER_SITE_OTHER): Payer: PPO | Admitting: Family Medicine

## 2017-09-14 ENCOUNTER — Ambulatory Visit (INDEPENDENT_AMBULATORY_CARE_PROVIDER_SITE_OTHER): Payer: PPO

## 2017-09-14 VITALS — BP 134/76 | HR 88 | Ht 67.0 in

## 2017-09-14 DIAGNOSIS — M25562 Pain in left knee: Secondary | ICD-10-CM | POA: Diagnosis not present

## 2017-09-14 DIAGNOSIS — G8929 Other chronic pain: Secondary | ICD-10-CM

## 2017-09-14 NOTE — Assessment & Plan Note (Signed)
Pain today is likely related to degenerative changes. - counseled on ice and compression  - pennsaid samples  - counseled on HEP  - consider gel injections

## 2017-09-14 NOTE — Patient Instructions (Signed)
Good to see you  Please try the compression for your knee  We will call you about the gel injections.   Take tylenol 650 mg three times a day is the best evidence based medicine we have for arthritis.   Glucosamine sulfate 750mg  twice a day is a supplement that has been shown to help moderate to severe arthritis.  Vitamin D 2000 IU daily  Fish oil 2 grams daily.   Tumeric 500mg  twice daily.   Capsaicin topically up to four times a day may also help with pain.  Water aerobics and cycling with low resistance are the best two types of exercise for arthritis.

## 2017-10-10 ENCOUNTER — Encounter: Payer: Self-pay | Admitting: Family Medicine

## 2017-10-10 ENCOUNTER — Ambulatory Visit (INDEPENDENT_AMBULATORY_CARE_PROVIDER_SITE_OTHER): Payer: PPO | Admitting: Family Medicine

## 2017-10-10 ENCOUNTER — Ambulatory Visit (INDEPENDENT_AMBULATORY_CARE_PROVIDER_SITE_OTHER): Payer: PPO

## 2017-10-10 VITALS — BP 128/72 | HR 66 | Ht 67.0 in

## 2017-10-10 DIAGNOSIS — M5432 Sciatica, left side: Secondary | ICD-10-CM | POA: Insufficient documentation

## 2017-10-10 DIAGNOSIS — G8929 Other chronic pain: Secondary | ICD-10-CM

## 2017-10-10 DIAGNOSIS — M25562 Pain in left knee: Secondary | ICD-10-CM | POA: Diagnosis not present

## 2017-10-10 HISTORY — DX: Sciatica, left side: M54.32

## 2017-10-10 MED ORDER — METHYLPREDNISOLONE ACETATE 40 MG/ML IJ SUSP
40.0000 mg | Freq: Once | INTRAMUSCULAR | Status: AC
  Administered 2017-10-10: 40 mg via INTRAMUSCULAR

## 2017-10-10 MED ORDER — GABAPENTIN 100 MG PO CAPS: 100.0000 mg | ORAL_CAPSULE | Freq: Three times a day (TID) | ORAL | 1 refills | Status: DC

## 2017-10-10 NOTE — Patient Instructions (Signed)
Good to see you  Please see [place] in one week

## 2017-10-10 NOTE — Assessment & Plan Note (Signed)
Started orthovisc series today  - f/u in one week.

## 2017-10-10 NOTE — Progress Notes (Signed)
Jasmine Bray - 67 y.o. female MRN [id number]  Date of birth: 1950/05/06  SUBJECTIVE:  Including CC & ROS.  Chief Complaint  Patient presents with  . Injections  . Sciatica    Jasmine Bray is a 67 y.o. female that is her today for her first Orthovisc injection in her left knee. The pain on the medial aspect. The pain is acute on chronic in nature. Has tried pennsaid and received gel injections in her right knee. Received a steroid injection in a few months ago but didn't relieve her pain very long. Pain is worse after activity   She has been having constant sciatica pain for the past month. Pain is located lower back and radiates to her leg. Admits to numbness in her left leg and foot. She had this similar symptom before. She has has been taking Advil for the pain, which has not helped. Denies injury or trauma.    Independent review of the left knee xray from 4/29 shows degenerative changes in the medial compartment.    Review of Systems  Constitutional: Negative for fever.  HENT: Negative for congestion.   Respiratory: Negative for cough.   Cardiovascular: Negative for chest pain.  Gastrointestinal: Negative for abdominal pain.  Musculoskeletal: Positive for arthralgias.  Skin: Negative for color change.  Neurological: Negative for weakness.  Hematological: Negative for adenopathy.  Psychiatric/Behavioral: Negative for agitation.    HISTORY: Past Medical, Surgical, Social, and Family History Reviewed & Updated per EMR.   Pertinent Historical Findings include:  Past Medical History:  Diagnosis Date  . Allergy    RHINITIS  . Anxiety 1989   Dr Jake Michaelis, [place]  . Depression    Dr Toy Care  . Fibroids   . GERD 11/11/2009   Qualifier: Diagnosis of  By: Linna Darner MD, Gwyndolyn Saxon   Dysphagia once monthly on average 4-10/14 ; resolved with Prilosec OTC prn No PMH of endoscopy    . GERD (gastroesophageal reflux disease)   . Hyperlipidemia   . Hypertension     Past Surgical History:    Procedure Laterality Date  . ABDOMINAL HYSTERECTOMY     & USO  . ANAL FISTULA ECTOMY      X 2, Dr Rosana Hoes  . COLONOSCOPY  2004, 2015   Carlean Purl  . MYOMECTOMY     FOR FIBROIDS, Dr Ubaldo Glassing  . steroid injection shoulder  05/11/2013   contusion/strain; Dr Onnie Graham    No Known Allergies  Family History  Problem Relation Age of Onset  . Arthritis Mother   . Stroke Mother 58  . Alcohol abuse Father   . Hyperlipidemia Sister   . Hypertension Sister   . Atrial fibrillation Sister   . Breast cancer Paternal Aunt   . Diabetes Maternal Grandmother   . Heart disease Paternal Grandmother        CAD; no MI  . Stroke Paternal Grandmother        in 5s  . Stroke Paternal Grandfather        in late 9s  . Colon cancer Neg Hx      Social History   Socioeconomic History  . Marital status: Divorced    Spouse name: Not on file  . Number of children: Not on file  . Years of education: Not on file  . Highest education level: Not on file  Occupational History  . Occupation: Press photographer  Social Needs  . Financial resource strain: Not on file  . Food insecurity:    Worry: Not on  file    Inability: Not on file  . Transportation needs:    Medical: Not on file    Non-medical: Not on file  Tobacco Use  . Smoking status: Never Smoker  . Smokeless tobacco: Never Used  Substance and Sexual Activity  . Alcohol use: Yes    Comment: 3-4/ day five nights a week  . Drug use: No  . Sexual activity: Not on file  Lifestyle  . Physical activity:    Days per week: Not on file    Minutes per session: Not on file  . Stress: Not on file  Relationships  . Social connections:    Talks on phone: Not on file    Gets together: Not on file    Attends religious service: Not on file    Active member of club or organization: Not on file    Attends meetings of clubs or organizations: Not on file    Relationship status: Not on file  . Intimate partner violence:    Fear of current or ex partner: Not on file     Emotionally abused: Not on file    Physically abused: Not on file    Forced sexual activity: Not on file  Other Topics Concern  . Not on file  Social History Narrative   GETS REG EXERCISE           PHYSICAL EXAM:  VS: BP 128/72 (BP Location: Left Arm, Patient Position: Sitting, Cuff Size: Normal)   Pulse 66   Ht 5\' 7"  (1.702 m)   SpO2 95%   BMI 29.44 kg/m  Physical Exam Gen: NAD, alert, cooperative with exam, well-appearing ENT: normal lips, normal nasal mucosa,  Eye: normal EOM, normal conjunctiva and lids CV:  no edema, +2 pedal pulses   Resp: no accessory muscle use, non-labored,  Skin: no rashes, no areas of induration  Neuro: normal tone, normal sensation to touch Psych:  normal insight, alert and oriented MSK:  Back/Left hip:  Normal IR and ER   Normal strength to resistance with hip flexion  Mild positive SLR on left  Normal gait  Neurovascularly intact    Aspiration/Injection Procedure Note Jasmine Bray [birth year]  Procedure: Injection Indications: left knee pain   Procedure Details Consent: Risks of procedure as well as the alternatives and risks of each were explained to the (patient/caregiver).  Consent for procedure obtained. Time Out: Verified patient identification, verified procedure, site/side was marked, verified correct patient position, special equipment/implants available, medications/allergies/relevent history reviewed, required imaging and test results available.  Performed.  The area was cleaned with iodine and alcohol swabs.    The left knee superior lateral suprapatellar pouch was injected with 3 cc's of 1% lidocaine with a 22 1 1/2" needle to anesthetize the tract in the skin.  The syringe was switched and Orthovisc was injected into the suprapatellar pouch..  Ultrasound was used. Images were obtained in Long views showing the injection.    A sterile dressing was applied.  Patient did tolerate procedure well.     ASSESSMENT & PLAN:    Sciatica of left side Reports acute pain. History of similar pain. Possible for piriformis vs herniation - IM depo  - counseled on supportive care  - if no improvement consider PT or imaging.   Chronic knee pain Started orthovisc series today  - f/u in one week.

## 2017-10-10 NOTE — Assessment & Plan Note (Signed)
Reports acute pain. History of similar pain. Possible for piriformis vs herniation - IM depo  - counseled on supportive care  - if no improvement consider PT or imaging.

## 2017-10-17 ENCOUNTER — Ambulatory Visit (INDEPENDENT_AMBULATORY_CARE_PROVIDER_SITE_OTHER): Payer: PPO | Admitting: Family Medicine

## 2017-10-17 ENCOUNTER — Ambulatory Visit (INDEPENDENT_AMBULATORY_CARE_PROVIDER_SITE_OTHER): Payer: PPO

## 2017-10-17 VITALS — BP 128/80 | HR 64 | Resp 16

## 2017-10-17 DIAGNOSIS — M25562 Pain in left knee: Secondary | ICD-10-CM

## 2017-10-17 DIAGNOSIS — G8929 Other chronic pain: Secondary | ICD-10-CM

## 2017-10-17 NOTE — Assessment & Plan Note (Signed)
orthovisc 2/3 today  - will f/u in one week to complete the series.

## 2017-10-17 NOTE — Patient Instructions (Signed)
Good to see you  We'll see you next week for the last injection

## 2017-10-17 NOTE — Progress Notes (Signed)
DAWT REEB - 67 y.o. female MRN [id number]  Date of birth: 02-Oct-1950  SUBJECTIVE:  Including CC & ROS.  No chief complaint on file.   Jasmine Bray is a 67 y.o. female that is here for her 2 of 3 orthovisc injections. .    Review of Systems  HISTORY: Past Medical, Surgical, Social, and Family History Reviewed & Updated per EMR.   Pertinent Historical Findings include:  Past Medical History:  Diagnosis Date  . Allergy    RHINITIS  . Anxiety 1989   Dr Jake Michaelis, [place]  . Depression    Dr Toy Care  . Fibroids   . GERD 11/11/2009   Qualifier: Diagnosis of  By: Linna Darner MD, Gwyndolyn Saxon   Dysphagia once monthly on average 4-10/14 ; resolved with Prilosec OTC prn No PMH of endoscopy    . GERD (gastroesophageal reflux disease)   . Hyperlipidemia   . Hypertension     Past Surgical History:  Procedure Laterality Date  . ABDOMINAL HYSTERECTOMY     & USO  . ANAL FISTULA ECTOMY      X 2, Dr Rosana Hoes  . COLONOSCOPY  2004, 2015   Carlean Purl  . MYOMECTOMY     FOR FIBROIDS, Dr Ubaldo Glassing  . steroid injection shoulder  05/11/2013   contusion/strain; Dr Onnie Graham    No Known Allergies  Family History  Problem Relation Age of Onset  . Arthritis Mother   . Stroke Mother 32  . Alcohol abuse Father   . Hyperlipidemia Sister   . Hypertension Sister   . Atrial fibrillation Sister   . Breast cancer Paternal Aunt   . Diabetes Maternal Grandmother   . Heart disease Paternal Grandmother        CAD; no MI  . Stroke Paternal Grandmother        in 80s  . Stroke Paternal Grandfather        in late 56s  . Colon cancer Neg Hx      Social History   Socioeconomic History  . Marital status: Divorced    Spouse name: Not on file  . Number of children: Not on file  . Years of education: Not on file  . Highest education level: Not on file  Occupational History  . Occupation: Press photographer  Social Needs  . Financial resource strain: Not on file  . Food insecurity:    Worry: Not on file    Inability: Not on  file  . Transportation needs:    Medical: Not on file    Non-medical: Not on file  Tobacco Use  . Smoking status: Never Smoker  . Smokeless tobacco: Never Used  Substance and Sexual Activity  . Alcohol use: Yes    Comment: 3-4/ day five nights a week  . Drug use: No  . Sexual activity: Not on file  Lifestyle  . Physical activity:    Days per week: Not on file    Minutes per session: Not on file  . Stress: Not on file  Relationships  . Social connections:    Talks on phone: Not on file    Gets together: Not on file    Attends religious service: Not on file    Active member of club or organization: Not on file    Attends meetings of clubs or organizations: Not on file    Relationship status: Not on file  . Intimate partner violence:    Fear of current or ex partner: Not on file    Emotionally  abused: Not on file    Physically abused: Not on file    Forced sexual activity: Not on file  Other Topics Concern  . Not on file  Social History Narrative   GETS REG EXERCISE           PHYSICAL EXAM:  VS: BP 128/80   Pulse 64   Resp 16   SpO2 98%  Physical Exam Gen: NAD, alert, cooperative with exam, well-appearing    Aspiration/Injection Procedure Note Jasmine Bray 12/01/1950  Procedure: Injection Indications: left knee pain   Procedure Details Consent: Risks of procedure as well as the alternatives and risks of each were explained to the (patient/caregiver).  Consent for procedure obtained. Time Out: Verified patient identification, verified procedure, site/side was marked, verified correct patient position, special equipment/implants available, medications/allergies/relevent history reviewed, required imaging and test results available.  Performed.  The area was cleaned with iodine and alcohol swabs.    The left knee superiorlateral SPP was injected using 3 cc's of 1% lidocaine with a 22 1 1/2" needle.  The syringe was switched off and Orthovisc was injected.   Ultrasound was used. Images were obtained in  Long views showing the injection.    A sterile dressing was applied.  Patient did tolerate procedure well.    ASSESSMENT & PLAN:   Chronic knee pain orthovisc 2/3 today  - will f/u in one week to complete the series.

## 2017-10-24 ENCOUNTER — Encounter: Payer: Self-pay | Admitting: Family Medicine

## 2017-10-24 ENCOUNTER — Ambulatory Visit (INDEPENDENT_AMBULATORY_CARE_PROVIDER_SITE_OTHER): Payer: PPO | Admitting: Family Medicine

## 2017-10-24 ENCOUNTER — Ambulatory Visit (INDEPENDENT_AMBULATORY_CARE_PROVIDER_SITE_OTHER): Payer: PPO

## 2017-10-24 VITALS — BP 132/68 | HR 62 | Ht 67.0 in

## 2017-10-24 DIAGNOSIS — M25562 Pain in left knee: Secondary | ICD-10-CM

## 2017-10-24 DIAGNOSIS — G8929 Other chronic pain: Secondary | ICD-10-CM

## 2017-10-24 NOTE — Assessment & Plan Note (Signed)
Completed series of orthovisc.  - can f/u in 4 weeks to monitor or as needed.

## 2017-10-24 NOTE — Progress Notes (Signed)
Jasmine Bray - 67 y.o. female MRN [id number]  Date of birth: June 01, 1950  SUBJECTIVE:  Including CC & ROS.  Chief Complaint  Patient presents with  . Injections    Jasmine Bray is a 67 y.o. female that is here for her third Orthovisc injection.      Review of Systems  HISTORY: Past Medical, Surgical, Social, and Family History Reviewed & Updated per EMR.   Pertinent Historical Findings include:  Past Medical History:  Diagnosis Date  . Allergy    RHINITIS  . Anxiety 1989   Dr Jake Michaelis, [place]  . Depression    Dr Toy Care  . Fibroids   . GERD 11/11/2009   Qualifier: Diagnosis of  By: Linna Darner MD, Gwyndolyn Saxon   Dysphagia once monthly on average 4-10/14 ; resolved with Prilosec OTC prn No PMH of endoscopy    . GERD (gastroesophageal reflux disease)   . Hyperlipidemia   . Hypertension     Past Surgical History:  Procedure Laterality Date  . ABDOMINAL HYSTERECTOMY     & USO  . ANAL FISTULA ECTOMY      X 2, Dr Rosana Hoes  . COLONOSCOPY  2004, 2015   Carlean Purl  . MYOMECTOMY     FOR FIBROIDS, Dr Ubaldo Glassing  . steroid injection shoulder  05/11/2013   contusion/strain; Dr Onnie Graham    No Known Allergies  Family History  Problem Relation Age of Onset  . Arthritis Mother   . Stroke Mother 94  . Alcohol abuse Father   . Hyperlipidemia Sister   . Hypertension Sister   . Atrial fibrillation Sister   . Breast cancer Paternal Aunt   . Diabetes Maternal Grandmother   . Heart disease Paternal Grandmother        CAD; no MI  . Stroke Paternal Grandmother        in 55s  . Stroke Paternal Grandfather        in late 20s  . Colon cancer Neg Hx      Social History   Socioeconomic History  . Marital status: Divorced    Spouse name: Not on file  . Number of children: Not on file  . Years of education: Not on file  . Highest education level: Not on file  Occupational History  . Occupation: Press photographer  Social Needs  . Financial resource strain: Not on file  . Food insecurity:    Worry: Not on  file    Inability: Not on file  . Transportation needs:    Medical: Not on file    Non-medical: Not on file  Tobacco Use  . Smoking status: Never Smoker  . Smokeless tobacco: Never Used  Substance and Sexual Activity  . Alcohol use: Yes    Comment: 3-4/ day five nights a week  . Drug use: No  . Sexual activity: Not on file  Lifestyle  . Physical activity:    Days per week: Not on file    Minutes per session: Not on file  . Stress: Not on file  Relationships  . Social connections:    Talks on phone: Not on file    Gets together: Not on file    Attends religious service: Not on file    Active member of club or organization: Not on file    Attends meetings of clubs or organizations: Not on file    Relationship status: Not on file  . Intimate partner violence:    Fear of current or ex partner: Not on file  Emotionally abused: Not on file    Physically abused: Not on file    Forced sexual activity: Not on file  Other Topics Concern  . Not on file  Social History Narrative   GETS REG EXERCISE           PHYSICAL EXAM:  VS: BP 132/68 (BP Location: Left Arm, Patient Position: Sitting, Cuff Size: Normal)   Pulse 62   Ht 5\' 7"  (1.702 m)   SpO2 96%   BMI 29.44 kg/m  Physical Exam Gen: NAD, alert, cooperative with exam, well-appearing   Aspiration/Injection Procedure Note Jasmine Bray 04-06-1950  Procedure: Injection Indications: left knee pain and OA   Procedure Details Consent: Risks of procedure as well as the alternatives and risks of each were explained to the (patient/caregiver).  Consent for procedure obtained. Time Out: Verified patient identification, verified procedure, site/side was marked, verified correct patient position, special equipment/implants available, medications/allergies/relevent history reviewed, required imaging and test results available.  Performed.  The area was cleaned with iodine and alcohol swabs.    The left knee superiorlateral  SPP was injected using 3 cc's of 1% lidocaine with a 22 1 1/2" needle.  The syringe was switched and orthovisc was injected. Ultrasound was used. Images were obtained in Long views showing the injection.    A sterile dressing was applied.  Patient did tolerate procedure well.     ASSESSMENT & PLAN:   Chronic knee pain Completed series of orthovisc.  - can f/u in 4 weeks to monitor or as needed.

## 2017-10-26 ENCOUNTER — Ambulatory Visit: Payer: PPO

## 2017-10-26 NOTE — Progress Notes (Deleted)
Subjective:   Jasmine Bray is a 67 y.o. female who presents for Medicare Annual (Subsequent) preventive examination.  Review of Systems:  No ROS.  Medicare Wellness Visit. Additional risk factors are reflected in the social history.    Sleep patterns: {SX; SLEEP PATTERNS:18802::"feels rested on waking","does not get up to void","gets up *** times nightly to void","sleeps *** hours nightly"}.    Home Safety/Smoke Alarms: Feels safe in home. Smoke alarms in place.  Living environment; residence and Firearm Safety: {Rehab home environment / accessibility:30080::"no firearms","firearms stored safely"}. Seat Belt Safety/Bike Helmet: Wears seat belt.      Objective:     Vitals: There were no vitals taken for this visit.  There is no height or weight on file to calculate BMI.  Advanced Directives 10/21/2016 09/11/2013  Does Patient Have a Medical Advance Directive? No Patient does not have advance directive  Would patient like information on creating a medical advance directive? Yes (ED - Information included in AVS) -  Pre-existing out of facility DNR order (yellow form or pink MOST form) - No    Tobacco Social History   Tobacco Use  Smoking Status Never Smoker  Smokeless Tobacco Never Used     Counseling given: Not Answered  Past Medical History:  Diagnosis Date  . Allergy    RHINITIS  . Anxiety 1989   Dr Jake Michaelis, [place]  . Depression    Dr Toy Care  . Fibroids   . GERD 11/11/2009   Qualifier: Diagnosis of  By: Linna Darner MD, Gwyndolyn Saxon   Dysphagia once monthly on average 4-10/14 ; resolved with Prilosec OTC prn No PMH of endoscopy    . GERD (gastroesophageal reflux disease)   . Hyperlipidemia   . Hypertension    Past Surgical History:  Procedure Laterality Date  . ABDOMINAL HYSTERECTOMY     & USO  . ANAL FISTULA ECTOMY      X 2, Dr Rosana Hoes  . COLONOSCOPY  2004, 2015   Carlean Purl  . MYOMECTOMY     FOR FIBROIDS, Dr Ubaldo Glassing  . steroid injection shoulder  05/11/2013   contusion/strain; Dr Onnie Graham   Family History  Problem Relation Age of Onset  . Arthritis Mother   . Stroke Mother 30  . Alcohol abuse Father   . Hyperlipidemia Sister   . Hypertension Sister   . Atrial fibrillation Sister   . Breast cancer Paternal Aunt   . Diabetes Maternal Grandmother   . Heart disease Paternal Grandmother        CAD; no MI  . Stroke Paternal Grandmother        in 22s  . Stroke Paternal Grandfather        in late 64s  . Colon cancer Neg Hx    Social History   Socioeconomic History  . Marital status: Divorced    Spouse name: Not on file  . Number of children: Not on file  . Years of education: Not on file  . Highest education level: Not on file  Occupational History  . Occupation: Press photographer  Social Needs  . Financial resource strain: Not on file  . Food insecurity:    Worry: Not on file    Inability: Not on file  . Transportation needs:    Medical: Not on file    Non-medical: Not on file  Tobacco Use  . Smoking status: Never Smoker  . Smokeless tobacco: Never Used  Substance and Sexual Activity  . Alcohol use: Yes    Comment: 3-4/ day  five nights a week  . Drug use: No  . Sexual activity: Not on file  Lifestyle  . Physical activity:    Days per week: Not on file    Minutes per session: Not on file  . Stress: Not on file  Relationships  . Social connections:    Talks on phone: Not on file    Gets together: Not on file    Attends religious service: Not on file    Active member of club or organization: Not on file    Attends meetings of clubs or organizations: Not on file    Relationship status: Not on file  Other Topics Concern  . Not on file  Social History Narrative   GETS REG EXERCISE          Outpatient Encounter Medications as of 10/26/2017  Medication Sig  . ALPRAZolam (XANAX) 1 MG tablet Take 1 mg by mouth as needed.  Marland Kitchen aspirin EC 81 MG tablet Take 81 mg by mouth daily.  Marland Kitchen b complex vitamins tablet Take 1 tablet by mouth daily.    . Biotin 1 MG CAPS Take 1 tablet by mouth daily.  Marland Kitchen buPROPion (WELLBUTRIN XL) 150 MG 24 hr tablet Take 150 mg by mouth daily.  . Cholecalciferol (VITAMIN D3) 3000 UNITS TABS Take 1 capsule by mouth daily. Taking 1000 units daily  . escitalopram (LEXAPRO) 20 MG tablet Take 10 mg by mouth daily.   Marland Kitchen estradiol (ESTRACE) 0.1 MG/GM vaginal cream Place 2 g vaginally. 1 GRAM 3x A WEEK   . estradiol (EVAMIST) 1.53 MG/SPRAY transdermal spray Place 1 spray onto the skin daily.  Marland Kitchen gabapentin (NEURONTIN) 100 MG capsule Take 1 capsule (100 mg total) by mouth 3 (three) times daily.  . Garlic 6269 MG CAPS Take 1 capsule by mouth daily.  . hydrochlorothiazide (HYDRODIURIL) 25 MG tablet Take 1 tablet (25 mg total) by mouth daily.  . meloxicam (MOBIC) 15 MG tablet TAKE 1 TABLET BY MOUTH EVERY DAY  . Misc Natural Products (COSAMIN ASU ADVANCED FORMULA PO) Take by mouth 2 (two) times daily.  . Multiple Vitamin (MULTIVITAMIN) tablet Take 1 tablet by mouth daily.  . Omega-3 Fatty Acids (FISH OIL) 1000 MG CAPS Take by mouth daily.  . Probiotic Product (PROBIOTIC PEARLS PO) Take 1 tablet by mouth daily.  . simvastatin (ZOCOR) 40 MG tablet Take 1 tablet (40 mg total) by mouth at bedtime.  . Turmeric 500 MG CAPS Take 500 mg by mouth daily.  . valACYclovir (VALTREX) 1000 MG tablet Take 1,000 mg by mouth. 1/2 by mouth once daily   No facility-administered encounter medications on file as of 10/26/2017.     Activities of Daily Living No flowsheet data found.  Patient Care Team: Binnie Rail, MD as PCP - General (Internal Medicine)    Assessment:   This is a routine wellness examination for Jasmine Bray. Physical assessment deferred to PCP.   Exercise Activities and Dietary recommendations   Diet (meal preparation, eat out, water intake, caffeinated beverages, dairy products, fruits and vegetables): {Desc; diets:16563}   Goals    None      Fall Risk Fall Risk  10/24/2017 10/21/2016 01/22/2016 01/10/2013   Falls in the past year? No No Yes No  Number falls in past yr: - - 2 or more -  Injury with Fall? - - No -    Depression Screen PHQ 2/9 Scores 10/21/2016 01/22/2016 01/10/2013  PHQ - 2 Score 2 0 0  PHQ- 9 Score 3 - -  Cognitive Function        Immunization History  Administered Date(s) Administered  . Influenza Split 02/02/2011, 01/10/2012  . Influenza Whole 12/22/2009  . Influenza, High Dose Seasonal PF 01/22/2016, 01/24/2017  . Influenza,inj,Quad PF,6+ Mos 01/17/2014  . Pneumococcal Conjugate-13 03/08/2016  . Td 11/11/2009  . Zoster 01/20/2015   Screening Tests Health Maintenance  Topic Date Due  . DEXA SCAN  02/13/2015  . PNA vac Low Risk Adult (2 of 2 - PPSV23) 03/08/2017  . INFLUENZA VACCINE  10/27/2017  . MAMMOGRAM  11/11/2018  . TETANUS/TDAP  11/12/2019  . COLONOSCOPY  09/26/2023  . Hepatitis C Screening  Completed      Plan:     I have personally reviewed and noted the following in the patient's chart:   . Medical and social history . Use of alcohol, tobacco or illicit drugs  . Current medications and supplements . Functional ability and status . Nutritional status . Physical activity . Advanced directives . List of other physicians . Vitals . Screenings to include cognitive, depression, and falls . Referrals and appointments  In addition, I have reviewed and discussed with patient certain preventive protocols, quality metrics, and best practice recommendations. A written personalized care plan for preventive services as well as general preventive health recommendations were provided to patient.     Michiel Cowboy, RN  10/26/2017

## 2017-10-31 NOTE — Progress Notes (Addendum)
Subjective:   Jasmine Bray is a 67 y.o. female who presents for Medicare Annual (Subsequent) preventive examination.  Review of Systems:  No ROS.  Medicare Wellness Visit. Additional risk factors are reflected in the social history.  Cardiac Risk Factors include: advanced age (>24men, >39 women);dyslipidemia;hypertension Sleep patterns: gets up 1 times nightly to void and sleeps 6-7 hours nightly.    Home Safety/Smoke Alarms: Feels safe in home. Smoke alarms in place.  Living environment; residence and Firearm Safety: 1-story house/ trailer, no firearms. Lives alone, no needs for DME, good support system Seat Belt Safety/Bike Helmet: Wears seat belt.     Objective:     Vitals: BP 124/76   Pulse 72   Resp 18   Ht 5\' 7"  (1.702 m)   Wt 180 lb (81.6 kg)   SpO2 96%   BMI 28.19 kg/m   Body mass index is 28.19 kg/m.  Advanced Directives 11/01/2017 10/21/2016 09/11/2013  Does Patient Have a Medical Advance Directive? No No Patient does not have advance directive  Would patient like information on creating a medical advance directive? Yes (ED - Information included in AVS) Yes (ED - Information included in AVS) -  Pre-existing out of facility DNR order (yellow form or pink MOST form) - - No    Tobacco Social History   Tobacco Use  Smoking Status Never Smoker  Smokeless Tobacco Never Used     Counseling given: Not Answered  Past Medical History:  Diagnosis Date  . Allergy    RHINITIS  . Anxiety 1989   Dr Jake Michaelis, [place]  . Depression    Dr Toy Care  . Fibroids   . GERD 11/11/2009   Qualifier: Diagnosis of  By: Linna Darner MD, Gwyndolyn Saxon   Dysphagia once monthly on average 4-10/14 ; resolved with Prilosec OTC prn No PMH of endoscopy    . GERD (gastroesophageal reflux disease)   . Hyperlipidemia   . Hypertension    Past Surgical History:  Procedure Laterality Date  . ABDOMINAL HYSTERECTOMY     & USO  . ANAL FISTULA ECTOMY      X 2, Dr Rosana Hoes  . COLONOSCOPY  2004, 2015   Carlean Purl    . MYOMECTOMY     FOR FIBROIDS, Dr Ubaldo Glassing  . steroid injection shoulder  05/11/2013   contusion/strain; Dr Onnie Graham   Family History  Problem Relation Age of Onset  . Arthritis Mother   . Stroke Mother 10  . Alcohol abuse Father   . Hyperlipidemia Sister   . Hypertension Sister   . Atrial fibrillation Sister   . Breast cancer Paternal Aunt   . Diabetes Maternal Grandmother   . Heart disease Paternal Grandmother        CAD; no MI  . Stroke Paternal Grandmother        in 28s  . Stroke Paternal Grandfather        in late 76s  . Colon cancer Neg Hx    Social History   Socioeconomic History  . Marital status: Divorced    Spouse name: Not on file  . Number of children: Not on file  . Years of education: Not on file  . Highest education level: Not on file  Occupational History  . Occupation: Press photographer  Social Needs  . Financial resource strain: Not hard at all  . Food insecurity:    Worry: Never true    Inability: Never true  . Transportation needs:    Medical: No    Non-medical:  No  Tobacco Use  . Smoking status: Never Smoker  . Smokeless tobacco: Never Used  Substance and Sexual Activity  . Alcohol use: Not Currently  . Drug use: No  . Sexual activity: Not Currently  Lifestyle  . Physical activity:    Days per week: 5 days    Minutes per session: 70 min  . Stress: Only a little  Relationships  . Social connections:    Talks on phone: More than three times a week    Gets together: More than three times a week    Attends religious service: Not on file    Active member of club or organization: Not on file    Attends meetings of clubs or organizations: Not on file    Relationship status: Not on file  Other Topics Concern  . Not on file  Social History Narrative   GETS REG EXERCISE          Outpatient Encounter Medications as of 11/01/2017  Medication Sig  . ALPRAZolam (XANAX) 1 MG tablet Take 1 mg by mouth as needed.  Marland Kitchen aspirin EC 81 MG tablet Take 81 mg by mouth  daily.  Marland Kitchen b complex vitamins tablet Take 1 tablet by mouth daily.  . Biotin 1 MG CAPS Take 1 tablet by mouth daily.  Marland Kitchen buPROPion (WELLBUTRIN XL) 150 MG 24 hr tablet Take 150 mg by mouth daily.  . Cholecalciferol (VITAMIN D3) 3000 UNITS TABS Take 1 capsule by mouth daily. Taking 1000 units daily  . escitalopram (LEXAPRO) 20 MG tablet Take 10 mg by mouth daily.   Marland Kitchen estradiol (ESTRACE) 0.1 MG/GM vaginal cream Place 2 g vaginally. 1 GRAM 3x A WEEK   . gabapentin (NEURONTIN) 100 MG capsule Take 1 capsule (100 mg total) by mouth 3 (three) times daily.  . Garlic 5409 MG CAPS Take 1 capsule by mouth daily.  . hydrochlorothiazide (HYDRODIURIL) 25 MG tablet Take 1 tablet (25 mg total) by mouth daily.  . meloxicam (MOBIC) 15 MG tablet TAKE 1 TABLET BY MOUTH EVERY DAY  . Misc Natural Products (COSAMIN ASU ADVANCED FORMULA PO) Take by mouth 2 (two) times daily.  . Multiple Vitamin (MULTIVITAMIN) tablet Take 1 tablet by mouth daily.  . Omega-3 Fatty Acids (FISH OIL) 1000 MG CAPS Take by mouth daily.  . Probiotic Product (PROBIOTIC PEARLS PO) Take 1 tablet by mouth daily.  . simvastatin (ZOCOR) 40 MG tablet Take 1 tablet (40 mg total) by mouth at bedtime.  . Turmeric 500 MG CAPS Take 500 mg by mouth daily.  . valACYclovir (VALTREX) 1000 MG tablet Take 1,000 mg by mouth. 1/2 by mouth once daily  . [DISCONTINUED] estradiol (EVAMIST) 1.53 MG/SPRAY transdermal spray Place 1 spray onto the skin daily.   No facility-administered encounter medications on file as of 11/01/2017.     Activities of Daily Living In your present state of health, do you have any difficulty performing the following activities: 11/01/2017  Hearing? N  Vision? N  Difficulty concentrating or making decisions? N  Walking or climbing stairs? N  Dressing or bathing? N  Doing errands, shopping? N  Preparing Food and eating ? N  Using the Toilet? N  In the past six months, have you accidently leaked urine? N  Do you have problems with loss  of bowel control? N  Managing your Medications? N  Managing your Finances? N  Housekeeping or managing your Housekeeping? N  Some recent data might be hidden    Patient Care Team: Quay Burow,  Claudina Lick, MD as PCP - General (Internal Medicine) Rosemarie Ax, MD as Consulting Physician (Family Medicine) Chucky May, MD as Consulting Physician (Psychiatry)    Assessment:   This is a routine wellness examination for Jasmine Bray. Physical assessment deferred to PCP.   Exercise Activities and Dietary recommendations Current Exercise Habits: Structured exercise class;Home exercise routine, Type of exercise: walking;calisthenics;strength training/weights, Time (Minutes): 60, Frequency (Times/Week): 5, Weekly Exercise (Minutes/Week): 300, Intensity: Moderate, Exercise limited by: orthopedic condition(s)  Diet (meal preparation, eat out, water intake, caffeinated beverages, dairy products, fruits and vegetables): in general, a "healthy" diet  , well balanced. eats a variety of fruits and vegetables daily, limits salt, fat/cholesterol, sugar,carbohydrates,caffeine, drinks 6-8 glasses of water daily.  Goals    . Patient Stated     Ride my horses every weekend and go horse camping more often. Continue to eat healthy, exercise, swim, enjoy life and family.       Fall Risk Fall Risk  11/01/2017 10/24/2017 10/21/2016 01/22/2016 01/10/2013  Falls in the past year? No No No Yes No  Number falls in past yr: - - - 2 or more -  Injury with Fall? - - - No -    Depression Screen PHQ 2/9 Scores 11/01/2017 10/21/2016 01/22/2016 01/10/2013  PHQ - 2 Score 0 2 0 0  PHQ- 9 Score 1 3 - -     Cognitive Function       Ad8 score reviewed for issues:  Issues making decisions: no  Less interest in hobbies / activities: no  Repeats questions, stories (family complaining): no  Trouble using ordinary gadgets (microwave, computer, phone):no  Forgets the month or year: no  Mismanaging finances:  no  Remembering appts: no  Daily problems with thinking and/or memory: no Ad8 score is= 0  Immunization History  Administered Date(s) Administered  . Influenza Split 02/02/2011, 01/10/2012  . Influenza Whole 12/22/2009  . Influenza, High Dose Seasonal PF 01/22/2016, 01/24/2017  . Influenza,inj,Quad PF,6+ Mos 01/17/2014  . Pneumococcal Conjugate-13 03/08/2016  . Td 11/11/2009  . Zoster 01/20/2015   Screening Tests Health Maintenance  Topic Date Due  . DEXA SCAN  02/13/2015  . INFLUENZA VACCINE  10/27/2017  . PNA vac Low Risk Adult (2 of 2 - PPSV23) 11/02/2018 (Originally 03/08/2017)  . MAMMOGRAM  11/11/2018  . TETANUS/TDAP  11/12/2019  . COLONOSCOPY  09/26/2023  . Hepatitis C Screening  Completed      Plan:    Continue doing brain stimulating activities (puzzles, reading, adult coloring books, staying active) to keep memory sharp.   Continue to eat heart healthy diet (full of fruits, vegetables, whole grains, lean protein, water--limit salt, fat, and sugar intake) and increase physical activity as tolerated.   I have personally reviewed and noted the following in the patient's chart:   . Medical and social history . Use of alcohol, tobacco or illicit drugs  . Current medications and supplements . Functional ability and status . Nutritional status . Physical activity . Advanced directives . List of other physicians . Vitals . Screenings to include cognitive, depression, and falls . Referrals and appointments  In addition, I have reviewed and discussed with patient certain preventive protocols, quality metrics, and best practice recommendations. A written personalized care plan for preventive services as well as general preventive health recommendations were provided to patient.     Michiel Cowboy, RN  11/01/2017   Medical screening examination/treatment/procedure(s) were performed by non-physician practitioner and as supervising physician I was immediately available  for consultation/collaboration. I agree with above. Binnie Rail, MD

## 2017-11-01 ENCOUNTER — Ambulatory Visit (INDEPENDENT_AMBULATORY_CARE_PROVIDER_SITE_OTHER): Payer: PPO | Admitting: *Deleted

## 2017-11-01 VITALS — BP 124/76 | HR 72 | Resp 18 | Ht 67.0 in | Wt 180.0 lb

## 2017-11-01 DIAGNOSIS — Z0000 Encounter for general adult medical examination without abnormal findings: Secondary | ICD-10-CM

## 2017-11-01 DIAGNOSIS — E2839 Other primary ovarian failure: Secondary | ICD-10-CM | POA: Diagnosis not present

## 2017-11-01 NOTE — Patient Instructions (Addendum)
Continue doing brain stimulating activities (puzzles, reading, adult coloring books, staying active) to keep memory sharp.   Continue to eat heart healthy diet (full of fruits, vegetables, whole grains, lean protein, water--limit salt, fat, and sugar intake) and increase physical activity as tolerated.   Jasmine Bray , Thank you for taking time to come for your Medicare Wellness Visit. I appreciate your ongoing commitment to your health goals. Please review the following plan we discussed and let me know if I can assist you in the future.   These are the goals we discussed: Goals    . Patient Stated     Ride my horses every weekend and go horse camping more often. Continue to eat healthy, exercise, swim, enjoy life and family.       This is a list of the screening recommended for you and due dates:  Health Maintenance  Topic Date Due  . DEXA scan (bone density measurement)  02/13/2015  . Pneumonia vaccines (2 of 2 - PPSV23) 03/08/2017  . Flu Shot  10/27/2017  . Mammogram  11/11/2018  . Tetanus Vaccine  11/12/2019  . Colon Cancer Screening  09/26/2023  .  Hepatitis C: One time screening is recommended by Center for Disease Control  (CDC) for  adults born from [birth year] through 1966.   Completed   Health Maintenance, Female Adopting a healthy lifestyle and getting preventive care can go a long way to promote health and wellness. Talk with your health care provider about what schedule of regular examinations is right for you. This is a good chance for you to check in with your provider about disease prevention and staying healthy. In between checkups, there are plenty of things you can do on your own. Experts have done a lot of research about which lifestyle changes and preventive measures are most likely to keep you healthy. Ask your health care provider for more information. Weight and diet Eat a healthy diet  Be sure to include plenty of vegetables, fruits, low-fat dairy products, and lean  protein.  Do not eat a lot of foods high in solid fats, added sugars, or salt.  Get regular exercise. This is one of the most important things you can do for your health. ? Most adults should exercise for at least 150 minutes each week. The exercise should increase your heart rate and make you sweat (moderate-intensity exercise). ? Most adults should also do strengthening exercises at least twice a week. This is in addition to the moderate-intensity exercise.  Maintain a healthy weight  Body mass index (BMI) is a measurement that can be used to identify possible weight problems. It estimates body fat based on height and weight. Your health care provider can help determine your BMI and help you achieve or maintain a healthy weight.  For females 21 years of age and older: ? A BMI below 18.5 is considered underweight. ? A BMI of 18.5 to 24.9 is normal. ? A BMI of 25 to 29.9 is considered overweight. ? A BMI of 30 and above is considered obese.  Watch levels of cholesterol and blood lipids  You should start having your blood tested for lipids and cholesterol at 68 years of age, then have this test every 5 years.  You may need to have your cholesterol levels checked more often if: ? Your lipid or cholesterol levels are high. ? You are older than 68 years of age. ? You are at high risk for heart disease.  Cancer screening Lung  Cancer  Lung cancer screening is recommended for adults 49-31 years old who are at high risk for lung cancer because of a history of smoking.  A yearly low-dose CT scan of the lungs is recommended for people who: ? Currently smoke. ? Have quit within the past 15 years. ? Have at least a 30-pack-year history of smoking. A pack year is smoking an average of one pack of cigarettes a day for 1 year.  Yearly screening should continue until it has been 15 years since you quit.  Yearly screening should stop if you develop a health problem that would prevent you from  having lung cancer treatment.  Breast Cancer  Practice breast self-awareness. This means understanding how your breasts normally appear and feel.  It also means doing regular breast self-exams. Let your health care provider know about any changes, no matter how small.  If you are in your 20s or 30s, you should have a clinical breast exam (CBE) by a health care provider every 1-3 years as part of a regular health exam.  If you are 81 or older, have a CBE every year. Also consider having a breast X-ray (mammogram) every year.  If you have a family history of breast cancer, talk to your health care provider about genetic screening.  If you are at high risk for breast cancer, talk to your health care provider about having an MRI and a mammogram every year.  Breast cancer gene (BRCA) assessment is recommended for women who have family members with BRCA-related cancers. BRCA-related cancers include: ? Breast. ? Ovarian. ? Tubal. ? Peritoneal cancers.  Results of the assessment will determine the need for genetic counseling and BRCA1 and BRCA2 testing.  Cervical Cancer Your health care provider may recommend that you be screened regularly for cancer of the pelvic organs (ovaries, uterus, and vagina). This screening involves a pelvic examination, including checking for microscopic changes to the surface of your cervix (Pap test). You may be encouraged to have this screening done every 3 years, beginning at age 72.  For women ages 59-65, health care providers may recommend pelvic exams and Pap testing every 3 years, or they may recommend the Pap and pelvic exam, combined with testing for human papilloma virus (HPV), every 5 years. Some types of HPV increase your risk of cervical cancer. Testing for HPV may also be done on women of any age with unclear Pap test results.  Other health care providers may not recommend any screening for nonpregnant women who are considered low risk for pelvic cancer  and who do not have symptoms. Ask your health care provider if a screening pelvic exam is right for you.  If you have had past treatment for cervical cancer or a condition that could lead to cancer, you need Pap tests and screening for cancer for at least 20 years after your treatment. If Pap tests have been discontinued, your risk factors (such as having a new sexual partner) need to be reassessed to determine if screening should resume. Some women have medical problems that increase the chance of getting cervical cancer. In these cases, your health care provider may recommend more frequent screening and Pap tests.  Colorectal Cancer  This type of cancer can be detected and often prevented.  Routine colorectal cancer screening usually begins at 68 years of age and continues through 68 years of age.  Your health care provider may recommend screening at an earlier age if you have risk factors for colon cancer.  Your health care provider may also recommend using home test kits to check for hidden blood in the stool.  A small camera at the end of a tube can be used to examine your colon directly (sigmoidoscopy or colonoscopy). This is done to check for the earliest forms of colorectal cancer.  Routine screening usually begins at age 80.  Direct examination of the colon should be repeated every 5-10 years through 68 years of age. However, you may need to be screened more often if early forms of precancerous polyps or small growths are found.  Skin Cancer  Check your skin from head to toe regularly.  Tell your health care provider about any new moles or changes in moles, especially if there is a change in a mole's shape or color.  Also tell your health care provider if you have a mole that is larger than the size of a pencil eraser.  Always use sunscreen. Apply sunscreen liberally and repeatedly throughout the day.  Protect yourself by wearing long sleeves, pants, a wide-brimmed hat, and  sunglasses whenever you are outside.  Heart disease, diabetes, and high blood pressure  High blood pressure causes heart disease and increases the risk of stroke. High blood pressure is more likely to develop in: ? People who have blood pressure in the high end of the normal range (130-139/85-89 mm Hg). ? People who are overweight or obese. ? People who are African American.  If you are 13-58 years of age, have your blood pressure checked every 3-5 years. If you are 37 years of age or older, have your blood pressure checked every year. You should have your blood pressure measured twice-once when you are at a hospital or clinic, and once when you are not at a hospital or clinic. Record the average of the two measurements. To check your blood pressure when you are not at a hospital or clinic, you can use: ? An automated blood pressure machine at a pharmacy. ? A home blood pressure monitor.  If you are between 23 years and 37 years old, ask your health care provider if you should take aspirin to prevent strokes.  Have regular diabetes screenings. This involves taking a blood sample to check your fasting blood sugar level. ? If you are at a normal weight and have a low risk for diabetes, have this test once every three years after 68 years of age. ? If you are overweight and have a high risk for diabetes, consider being tested at a younger age or more often. Preventing infection Hepatitis B  If you have a higher risk for hepatitis B, you should be screened for this virus. You are considered at high risk for hepatitis B if: ? You were born in a country where hepatitis B is common. Ask your health care provider which countries are considered high risk. ? Your parents were born in a high-risk country, and you have not been immunized against hepatitis B (hepatitis B vaccine). ? You have HIV or AIDS. ? You use needles to inject street drugs. ? You live with someone who has hepatitis B. ? You have  had sex with someone who has hepatitis B. ? You get hemodialysis treatment. ? You take certain medicines for conditions, including cancer, organ transplantation, and autoimmune conditions.  Hepatitis C  Blood testing is recommended for: ? Everyone born from [birth year] through 1965. ? Anyone with known risk factors for hepatitis C.  Sexually transmitted infections (STIs)  You should be screened  for sexually transmitted infections (STIs) including gonorrhea and chlamydia if: ? You are sexually active and are younger than 68 years of age. ? You are older than 68 years of age and your health care provider tells you that you are at risk for this type of infection. ? Your sexual activity has changed since you were last screened and you are at an increased risk for chlamydia or gonorrhea. Ask your health care provider if you are at risk.  If you do not have HIV, but are at risk, it may be recommended that you take a prescription medicine daily to prevent HIV infection. This is called pre-exposure prophylaxis (PrEP). You are considered at risk if: ? You are sexually active and do not regularly use condoms or know the HIV status of your partner(s). ? You take drugs by injection. ? You are sexually active with a partner who has HIV.  Talk with your health care provider about whether you are at high risk of being infected with HIV. If you choose to begin PrEP, you should first be tested for HIV. You should then be tested every 3 months for as long as you are taking PrEP. Pregnancy  If you are premenopausal and you may become pregnant, ask your health care provider about preconception counseling.  If you may become pregnant, take 400 to 800 micrograms (mcg) of folic acid every day.  If you want to prevent pregnancy, talk to your health care provider about birth control (contraception). Osteoporosis and menopause  Osteoporosis is a disease in which the bones lose minerals and strength with aging. This  can result in serious bone fractures. Your risk for osteoporosis can be identified using a bone density scan.  If you are 29 years of age or older, or if you are at risk for osteoporosis and fractures, ask your health care provider if you should be screened.  Ask your health care provider whether you should take a calcium or vitamin D supplement to lower your risk for osteoporosis.  Menopause may have certain physical symptoms and risks.  Hormone replacement therapy may reduce some of these symptoms and risks. Talk to your health care provider about whether hormone replacement therapy is right for you. Follow these instructions at home:  Schedule regular health, dental, and eye exams.  Stay current with your immunizations.  Do not use any tobacco products including cigarettes, chewing tobacco, or electronic cigarettes.  If you are pregnant, do not drink alcohol.  If you are breastfeeding, limit how much and how often you drink alcohol.  Limit alcohol intake to no more than 1 drink per day for nonpregnant women. One drink equals 12 ounces of beer, 5 ounces of wine, or 1 ounces of hard liquor.  Do not use street drugs.  Do not share needles.  Ask your health care provider for help if you need support or information about quitting drugs.  Tell your health care provider if you often feel depressed.  Tell your health care provider if you have ever been abused or do not feel safe at home. This information is not intended to replace advice given to you by your health care provider. Make sure you discuss any questions you have with your health care provider. Document Released: 09/28/2010 Document Revised: 08/21/2015 Document Reviewed: 12/17/2014 Elsevier Interactive Patient Education  [employer].

## 2017-12-01 ENCOUNTER — Ambulatory Visit (INDEPENDENT_AMBULATORY_CARE_PROVIDER_SITE_OTHER)
Admission: RE | Admit: 2017-12-01 | Discharge: 2017-12-01 | Disposition: A | Discharge status: Home / Self Care | Payer: PPO | Source: Ambulatory Visit | Attending: Internal Medicine | Admitting: Internal Medicine

## 2017-12-01 DIAGNOSIS — Z130 Encounter for screening for diseases of the blood and blood-forming organs and certain disorders involving the immune mechanism: Secondary | ICD-10-CM | POA: Diagnosis not present

## 2017-12-01 DIAGNOSIS — Z01419 Encounter for gynecological examination (general) (routine) without abnormal findings: Secondary | ICD-10-CM | POA: Diagnosis not present

## 2017-12-01 DIAGNOSIS — Z79890 Hormone replacement therapy (postmenopausal): Secondary | ICD-10-CM | POA: Diagnosis not present

## 2017-12-01 DIAGNOSIS — E2839 Other primary ovarian failure: Secondary | ICD-10-CM

## 2017-12-01 DIAGNOSIS — Z780 Asymptomatic menopausal state: Secondary | ICD-10-CM | POA: Diagnosis not present

## 2017-12-01 DIAGNOSIS — Z6825 Body mass index (BMI) 25.0-25.9, adult: Secondary | ICD-10-CM | POA: Diagnosis not present

## 2017-12-01 DIAGNOSIS — Z1389 Encounter for screening for other disorder: Secondary | ICD-10-CM | POA: Diagnosis not present

## 2017-12-01 DIAGNOSIS — Z1231 Encounter for screening mammogram for malignant neoplasm of breast: Secondary | ICD-10-CM | POA: Diagnosis not present

## 2017-12-05 ENCOUNTER — Encounter: Payer: Self-pay | Admitting: Internal Medicine

## 2017-12-08 ENCOUNTER — Other Ambulatory Visit: Payer: Self-pay | Admitting: Gynecology

## 2017-12-08 DIAGNOSIS — R928 Other abnormal and inconclusive findings on diagnostic imaging of breast: Secondary | ICD-10-CM

## 2017-12-13 ENCOUNTER — Other Ambulatory Visit: Payer: PPO

## 2017-12-13 ENCOUNTER — Other Ambulatory Visit: Payer: Self-pay | Admitting: Family Medicine

## 2017-12-13 ENCOUNTER — Ambulatory Visit
Admission: RE | Admit: 2017-12-13 | Discharge: 2017-12-13 | Disposition: A | Discharge status: Home / Self Care | Payer: PPO | Source: Ambulatory Visit | Attending: Gynecology | Admitting: Gynecology

## 2017-12-13 ENCOUNTER — Ambulatory Visit: Payer: PPO

## 2017-12-13 DIAGNOSIS — R922 Inconclusive mammogram: Secondary | ICD-10-CM | POA: Diagnosis not present

## 2017-12-13 DIAGNOSIS — R928 Other abnormal and inconclusive findings on diagnostic imaging of breast: Secondary | ICD-10-CM

## 2017-12-21 ENCOUNTER — Encounter: Payer: PPO | Admitting: Internal Medicine

## 2017-12-27 ENCOUNTER — Encounter: Payer: PPO | Admitting: Internal Medicine

## 2018-01-25 ENCOUNTER — Other Ambulatory Visit (INDEPENDENT_AMBULATORY_CARE_PROVIDER_SITE_OTHER): Payer: PPO

## 2018-01-25 ENCOUNTER — Ambulatory Visit (INDEPENDENT_AMBULATORY_CARE_PROVIDER_SITE_OTHER): Payer: PPO | Admitting: Internal Medicine

## 2018-01-25 ENCOUNTER — Encounter: Payer: Self-pay | Admitting: Internal Medicine

## 2018-01-25 VITALS — BP 142/90 | HR 72 | Temp 97.8°F | Resp 16 | Ht 67.0 in | Wt 186.1 lb

## 2018-01-25 DIAGNOSIS — R7689 Other specified abnormal immunological findings in serum: Secondary | ICD-10-CM

## 2018-01-25 DIAGNOSIS — I10 Essential (primary) hypertension: Secondary | ICD-10-CM

## 2018-01-25 DIAGNOSIS — E785 Hyperlipidemia, unspecified: Secondary | ICD-10-CM | POA: Diagnosis not present

## 2018-01-25 DIAGNOSIS — R768 Other specified abnormal immunological findings in serum: Secondary | ICD-10-CM | POA: Diagnosis not present

## 2018-01-25 DIAGNOSIS — R5383 Other fatigue: Secondary | ICD-10-CM | POA: Insufficient documentation

## 2018-01-25 DIAGNOSIS — Z0000 Encounter for general adult medical examination without abnormal findings: Secondary | ICD-10-CM

## 2018-01-25 DIAGNOSIS — R7301 Impaired fasting glucose: Secondary | ICD-10-CM

## 2018-01-25 DIAGNOSIS — Z23 Encounter for immunization: Secondary | ICD-10-CM | POA: Diagnosis not present

## 2018-01-25 LAB — CBC WITH DIFFERENTIAL/PLATELET
Basophils Absolute: 0.1 10*3/uL (ref 0.0–0.1)
Basophils Relative: 1.2 % (ref 0.0–3.0)
Eosinophils Absolute: 0.2 10*3/uL (ref 0.0–0.7)
Eosinophils Relative: 3.2 % (ref 0.0–5.0)
HEMATOCRIT: 40.4 % (ref 36.0–46.0)
HEMOGLOBIN: 13.9 g/dL (ref 12.0–15.0)
LYMPHS PCT: 34.8 % (ref 12.0–46.0)
Lymphs Abs: 2.3 10*3/uL (ref 0.7–4.0)
MCHC: 34.3 g/dL (ref 30.0–36.0)
MCV: 93.4 fl (ref 78.0–100.0)
MONOS PCT: 10.1 % (ref 3.0–12.0)
Monocytes Absolute: 0.7 10*3/uL (ref 0.1–1.0)
Neutro Abs: 3.4 10*3/uL (ref 1.4–7.7)
Neutrophils Relative %: 50.7 % (ref 43.0–77.0)
Platelets: 201 10*3/uL (ref 150.0–400.0)
RBC: 4.33 Mil/uL (ref 3.87–5.11)
RDW: 13.3 % (ref 11.5–15.5)
WBC: 6.7 10*3/uL (ref 4.0–10.5)

## 2018-01-25 LAB — COMPREHENSIVE METABOLIC PANEL
ALT: 18 U/L (ref 0–35)
AST: 19 U/L (ref 0–37)
Albumin: 4.3 g/dL (ref 3.5–5.2)
Alkaline Phosphatase: 53 U/L (ref 39–117)
BUN: 23 mg/dL (ref 6–23)
CALCIUM: 9.4 mg/dL (ref 8.4–10.5)
CO2: 27 mEq/L (ref 19–32)
CREATININE: 0.67 mg/dL (ref 0.40–1.20)
Chloride: 101 mEq/L (ref 96–112)
GFR: 93.04 mL/min (ref >60.00)
Glucose, Bld: 102 mg/dL — ABNORMAL HIGH (ref 70–99)
Potassium: 4 mEq/L (ref 3.5–5.1)
Sodium: 137 mEq/L (ref 135–145)
Total Bilirubin: 0.4 mg/dL (ref 0.2–1.2)
Total Protein: 7 g/dL (ref 6.0–8.3)

## 2018-01-25 LAB — LIPID PANEL
Cholesterol: 172 mg/dL (ref 0–200)
HDL: 60.9 mg/dL (ref >39.00)
LDL CALC: 80 mg/dL (ref 0–99)
NonHDL: 111.34
TRIGLYCERIDES: 159 mg/dL — AB (ref 0.0–149.0)
Total CHOL/HDL Ratio: 3
VLDL: 31.8 mg/dL (ref 0.0–40.0)

## 2018-01-25 LAB — HEMOGLOBIN A1C: Hgb A1c MFr Bld: 5.6 % (ref 4.6–6.5)

## 2018-01-25 LAB — TSH: TSH: 1.42 u[IU]/mL (ref 0.35–4.50)

## 2018-01-25 MED ORDER — SIMVASTATIN 40 MG PO TABS: 40.0000 mg | ORAL_TABLET | Freq: Every day | ORAL | 3 refills | Status: DC

## 2018-01-25 MED ORDER — HYDROCHLOROTHIAZIDE 25 MG PO TABS: 25.0000 mg | ORAL_TABLET | Freq: Every day | ORAL | 3 refills | Status: DC

## 2018-01-25 NOTE — Assessment & Plan Note (Signed)
A1c

## 2018-01-25 NOTE — Progress Notes (Signed)
Subjective:    Patient ID: Jasmine Bray, female    DOB: 02-Feb-1951, 67 y.o.   MRN: [id number]  HPI She is here for a physical exam.   She denies any major changes since she was here last.  She feels she is doing good.  She has no major concerns, except feeling tired.  She wonders about her thyroid..  She did lose weight over the past year, but regained it when she went back to drinking alcohol.  Not drinking helped her lose weight.  She has restarted drinking, but is not drinking as much-drinks 1 drink at night and does not drink every night.  Her grandson did overdose recently and she feels she is doing with that okay.  Her family and friends have encouraged her to go to therapy and she will consider it.  Medications and allergies reviewed with patient and updated if appropriate.  Patient Active Problem List   Diagnosis Date Noted  . Sciatica of left side 10/10/2017  . Chronic knee pain 07/25/2017  . Hamstring strain 05/20/2017  . Contusion of right hip 02/08/2017  . Rib pain on right side 02/01/2017  . Right hip pain 02/01/2017  . Left upper arm pain 03/17/2016  . Hepatitis C antibody test positive 01/29/2016  . Tachycardia 11/06/2015  . Acute pain of right shoulder 04/24/2013  . Fasting hyperglycemia 01/10/2012  . Hyperlipidemia 11/11/2009  . 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 Essential hypertension 11/11/2009    Current Outpatient Medications on File Prior to Visit  Medication Sig Dispense Refill  . ALPRAZolam (XANAX) 1 MG tablet Take 1 mg by mouth as needed.    Marland Kitchen aspirin EC 81 MG tablet Take 81 mg by mouth daily.    Marland Kitchen b complex vitamins tablet Take 1 tablet by mouth daily.    . Biotin 1 MG CAPS Take 1 tablet by mouth daily.    Marland Kitchen buPROPion (WELLBUTRIN XL) 150 MG 24 hr tablet Take 150 mg by mouth daily.    . Cholecalciferol (VITAMIN D3) 3000 UNITS TABS Take 1 capsule by mouth daily. Taking 1000 units daily    . escitalopram (LEXAPRO) 20 MG tablet Take 10  mg by mouth daily.     Marland Kitchen estradiol (ESTRACE) 0.1 MG/GM vaginal cream Place 2 g vaginally. 1 GRAM 3x A WEEK     . Garlic 5465 MG CAPS Take 1 capsule by mouth daily.    . hydrochlorothiazide (HYDRODIURIL) 25 MG tablet Take 1 tablet (25 mg total) by mouth daily. 90 tablet 3  . meloxicam (MOBIC) 15 MG tablet TAKE 1 TABLET BY MOUTH EVERY DAY 90 tablet 0  . Misc Natural Products (COSAMIN ASU ADVANCED FORMULA PO) Take by mouth 2 (two) times daily.    . Multiple Vitamin (MULTIVITAMIN) tablet Take 1 tablet by mouth daily.    . Omega-3 Fatty Acids (FISH OIL) 1000 MG CAPS Take by mouth daily.    . Probiotic Product (PROBIOTIC PEARLS PO) Take 1 tablet by mouth daily.    . simvastatin (ZOCOR) 40 MG tablet Take 1 tablet (40 mg total) by mouth at bedtime. 90 tablet 3  . Turmeric 500 MG CAPS Take 500 mg by mouth daily.    . valACYclovir (VALTREX) 1000 MG tablet Take 1,000 mg by mouth. 1/2 by mouth once daily    . gabapentin (NEURONTIN) 100 MG capsule TAKE 1 CAPSULE BY MOUTH THREE TIMES A DAY (Patient not taking: Reported on 01/25/2018) 270 capsule 1   No current facility-administered  medications on file prior to visit.     Past Medical History:  Diagnosis Date  . Allergy    RHINITIS  . Anxiety 1989   Dr Jake Michaelis, [place]  . Depression    Dr Toy Care  . Fibroids   . GERD 11/11/2009   Qualifier: Diagnosis of  By: Linna Darner MD, Gwyndolyn Saxon   Dysphagia once monthly on average 4-10/14 ; resolved with Prilosec OTC prn No PMH of endoscopy    . GERD (gastroesophageal reflux disease)   . Hyperlipidemia   . Hypertension     Past Surgical History:  Procedure Laterality Date  . ABDOMINAL HYSTERECTOMY     & USO  . ANAL FISTULA ECTOMY      X 2, Dr Rosana Hoes  . COLONOSCOPY  2004, 2015   Carlean Purl  . MYOMECTOMY     FOR FIBROIDS, Dr Ubaldo Glassing  . steroid injection shoulder  05/11/2013   contusion/strain; Dr Onnie Graham    Social History   Socioeconomic History  . Marital status: Divorced    Spouse name: Not on file  . Number of  children: Not on file  . Years of education: Not on file  . Highest education level: Not on file  Occupational History  . Occupation: Press photographer  Social Needs  . Financial resource strain: Not hard at all  . Food insecurity:    Worry: Never true    Inability: Never true  . Transportation needs:    Medical: No    Non-medical: No  Tobacco Use  . Smoking status: Never Smoker  . Smokeless tobacco: Never Used  Substance and Sexual Activity  . Alcohol use: Not Currently  . Drug use: No  . Sexual activity: Not Currently  Lifestyle  . Physical activity:    Days per week: 5 days    Minutes per session: 70 min  . Stress: Only a little  Relationships  . Social connections:    Talks on phone: More than three times a week    Gets together: More than three times a week    Attends religious service: Not on file    Active member of club or organization: Not on file    Attends meetings of clubs or organizations: Not on file    Relationship status: Not on file  Other Topics Concern  . Not on file  Social History Narrative   GETS REG EXERCISE          Family History  Problem Relation Age of Onset  . Arthritis Mother   . Stroke Mother 23  . Alcohol abuse Father   . Hyperlipidemia Sister   . Hypertension Sister   . Atrial fibrillation Sister   . Breast cancer Paternal Aunt   . Diabetes Maternal Grandmother   . Heart disease Paternal Grandmother        CAD; no MI  . Stroke Paternal Grandmother        in 19s  . Stroke Paternal Grandfather        in late 43s  . Colon cancer Neg Hx     Review of Systems  Constitutional: Positive for fatigue. Negative for chills and fever.  HENT: Negative for trouble swallowing.   Eyes: Negative for visual disturbance.  Respiratory: Positive for cough (occ dry  - after taking pills only). Negative for shortness of breath and wheezing.   Cardiovascular: Negative for chest pain, palpitations and leg swelling.  Gastrointestinal: Negative for abdominal  pain, blood in stool, constipation, diarrhea and nausea.  Rare GERD  Genitourinary: Negative for dysuria and hematuria.  Musculoskeletal: Positive for arthralgias.  Skin: Negative for color change and rash.  Neurological: Positive for dizziness (rare only when on phone and then gets up quickly) and headaches (occ).  Psychiatric/Behavioral: Positive for dysphoric mood. Negative for sleep disturbance. The patient is nervous/anxious.        Objective:   Vitals:   01/25/18 1325  BP: (!) 142/90  Pulse: 72  Resp: 16  Temp: 97.8 F (36.6 C)  SpO2: 96%   Filed Weights   01/25/18 1325  Weight: 186 lb 1.9 oz (84.4 kg)   Body mass index is 29.15 kg/m.  BP Readings from Last 3 Encounters:  01/25/18 (!) 142/90  11/01/17 124/76  10/24/17 132/68    Wt Readings from Last 3 Encounters:  01/25/18 186 lb 1.9 oz (84.4 kg)  11/01/17 180 lb (81.6 kg)  02/01/17 188 lb (85.3 kg)     Physical Exam Constitutional: She appears well-developed and well-nourished. No distress.  HENT:  Head: Normocephalic and atraumatic.  Right Ear: External ear normal. Normal ear canal and TM Left Ear: External ear normal.  Normal ear canal and TM Mouth/Throat: Oropharynx is clear and moist.  Eyes: Conjunctivae and EOM are normal.  Neck: Neck supple. No tracheal deviation present. No thyromegaly present.  No carotid bruit  Cardiovascular: Normal rate, regular rhythm and normal heart sounds.   No murmur heard.  No edema. Pulmonary/Chest: Effort normal and breath sounds normal. No respiratory distress. She has no wheezes. She has no rales.  Breast: deferred to Gyn Abdominal: Soft. She exhibits no distension. There is no tenderness.  Lymphadenopathy: She has no cervical adenopathy.  Skin: Skin is warm and dry. She is not diaphoretic.  Psychiatric: She has a normal mood and affect. Her behavior is normal.        Assessment & Plan:   Physical exam: Screening blood work  ordered Immunizations   Flu  today, discussed shingrix Colonoscopy   Up to date  Mammogram   Up to date  Gyn    Up to date  Dexa    Up to date  Eye exams   Up to date  EKG   10/2015 Exercise  Rides horses, crossfit  2/week Weight   Working on weight loss Skin   sees derm ,no concerns Substance abuse  1 drink a night most nights but not all  See Problem List for Assessment and Plan of chronic medical problems.  FU in one yar

## 2018-01-25 NOTE — Assessment & Plan Note (Signed)
Check lipid panel  Continue daily statin Regular exercise and healthy diet encouraged  

## 2018-01-25 NOTE — Assessment & Plan Note (Signed)
Hepatitis C antibody positive, but undetectable virus Check hepatitis C viral load

## 2018-01-25 NOTE — Patient Instructions (Addendum)
Tests ordered today. Your results will be released to [facility] (or called to you) after review, usually within 72hours after test completion. If any changes need to be made, you will be notified at that same time.  All other Health Maintenance issues reviewed.   All recommended immunizations and age-appropriate screenings are up-to-date or discussed.  Flu immunization administered today.    Medications reviewed and updated.  Changes include :     Your prescription(s) have been submitted to your pharmacy. Please take as directed and contact our office if you believe you are having problem(s) with the medication(s).   Please followup in one year   Health Maintenance, Female Adopting a healthy lifestyle and getting preventive care can go a long way to promote health and wellness. Talk with your health care provider about what schedule of regular examinations is right for you. This is a good chance for you to check in with your provider about disease prevention and staying healthy. In between checkups, there are plenty of things you can do on your own. Experts have done a lot of research about which lifestyle changes and preventive measures are most likely to keep you healthy. Ask your health care provider for more information. Weight and diet Eat a healthy diet  Be sure to include plenty of vegetables, fruits, low-fat dairy products, and lean protein.  Do not eat a lot of foods high in solid fats, added sugars, or salt.  Get regular exercise. This is one of the most important things you can do for your health. ? Most adults should exercise for at least 150 minutes each week. The exercise should increase your heart rate and make you sweat (moderate-intensity exercise). ? Most adults should also do strengthening exercises at least twice a week. This is in addition to the moderate-intensity exercise.  Maintain a healthy weight  Body mass index (BMI) is a measurement that can be used to  identify possible weight problems. It estimates body fat based on height and weight. Your health care provider can help determine your BMI and help you achieve or maintain a healthy weight.  For females 68 years of age and older: ? A BMI below 18.5 is considered underweight. ? A BMI of 18.5 to 24.9 is normal. ? A BMI of 25 to 29.9 is considered overweight. ? A BMI of 30 and above is considered obese.  Watch levels of cholesterol and blood lipids  You should start having your blood tested for lipids and cholesterol at 68 years of age, then have this test every 5 years.  You may need to have your cholesterol levels checked more often if: ? Your lipid or cholesterol levels are high. ? You are older than 68 years of age. ? You are at high risk for heart disease.  Cancer screening Lung Cancer  Lung cancer screening is recommended for adults 76-6 years old who are at high risk for lung cancer because of a history of smoking.  A yearly low-dose CT scan of the lungs is recommended for people who: ? Currently smoke. ? Have quit within the past 15 years. ? Have at least a 30-pack-year history of smoking. A pack year is smoking an average of one pack of cigarettes a day for 1 year.  Yearly screening should continue until it has been 15 years since you quit.  Yearly screening should stop if you develop a health problem that would prevent you from having lung cancer treatment.  Breast Cancer  Practice breast  self-awareness. This means understanding how your breasts normally appear and feel.  It also means doing regular breast self-exams. Let your health care provider know about any changes, no matter how small.  If you are in your 20s or 30s, you should have a clinical breast exam (CBE) by a health care provider every 1-3 years as part of a regular health exam.  If you are 18 or older, have a CBE every year. Also consider having a breast X-ray (mammogram) every year.  If you have a  family history of breast cancer, talk to your health care provider about genetic screening.  If you are at high risk for breast cancer, talk to your health care provider about having an MRI and a mammogram every year.  Breast cancer gene (BRCA) assessment is recommended for women who have family members with BRCA-related cancers. BRCA-related cancers include: ? Breast. ? Ovarian. ? Tubal. ? Peritoneal cancers.  Results of the assessment will determine the need for genetic counseling and BRCA1 and BRCA2 testing.  Cervical Cancer Your health care provider may recommend that you be screened regularly for cancer of the pelvic organs (ovaries, uterus, and vagina). This screening involves a pelvic examination, including checking for microscopic changes to the surface of your cervix (Pap test). You may be encouraged to have this screening done every 3 years, beginning at age 59.  For women ages 24-65, health care providers may recommend pelvic exams and Pap testing every 3 years, or they may recommend the Pap and pelvic exam, combined with testing for human papilloma virus (HPV), every 5 years. Some types of HPV increase your risk of cervical cancer. Testing for HPV may also be done on women of any age with unclear Pap test results.  Other health care providers may not recommend any screening for nonpregnant women who are considered low risk for pelvic cancer and who do not have symptoms. Ask your health care provider if a screening pelvic exam is right for you.  If you have had past treatment for cervical cancer or a condition that could lead to cancer, you need Pap tests and screening for cancer for at least 20 years after your treatment. If Pap tests have been discontinued, your risk factors (such as having a new sexual partner) need to be reassessed to determine if screening should resume. Some women have medical problems that increase the chance of getting cervical cancer. In these cases, your  health care provider may recommend more frequent screening and Pap tests.  Colorectal Cancer  This type of cancer can be detected and often prevented.  Routine colorectal cancer screening usually begins at 68 years of age and continues through 68 years of age.  Your health care provider may recommend screening at an earlier age if you have risk factors for colon cancer.  Your health care provider may also recommend using home test kits to check for hidden blood in the stool.  A small camera at the end of a tube can be used to examine your colon directly (sigmoidoscopy or colonoscopy). This is done to check for the earliest forms of colorectal cancer.  Routine screening usually begins at age 70.  Direct examination of the colon should be repeated every 5-10 years through 68 years of age. However, you may need to be screened more often if early forms of precancerous polyps or small growths are found.  Skin Cancer  Check your skin from head to toe regularly.  Tell your health care provider about  any new moles or changes in moles, especially if there is a change in a mole's shape or color.  Also tell your health care provider if you have a mole that is larger than the size of a pencil eraser.  Always use sunscreen. Apply sunscreen liberally and repeatedly throughout the day.  Protect yourself by wearing long sleeves, pants, a wide-brimmed hat, and sunglasses whenever you are outside.  Heart disease, diabetes, and high blood pressure  High blood pressure causes heart disease and increases the risk of stroke. High blood pressure is more likely to develop in: ? People who have blood pressure in the high end of the normal range (130-139/85-89 mm Hg). ? People who are overweight or obese. ? People who are African American.  If you are 20-65 years of age, have your blood pressure checked every 3-5 years. If you are 58 years of age or older, have your blood pressure checked every year. You  should have your blood pressure measured twice-once when you are at a hospital or clinic, and once when you are not at a hospital or clinic. Record the average of the two measurements. To check your blood pressure when you are not at a hospital or clinic, you can use: ? An automated blood pressure machine at a pharmacy. ? A home blood pressure monitor.  If you are between 11 years and 43 years old, ask your health care provider if you should take aspirin to prevent strokes.  Have regular diabetes screenings. This involves taking a blood sample to check your fasting blood sugar level. ? If you are at a normal weight and have a low risk for diabetes, have this test once every three years after 68 years of age. ? If you are overweight and have a high risk for diabetes, consider being tested at a younger age or more often. Preventing infection Hepatitis B  If you have a higher risk for hepatitis B, you should be screened for this virus. You are considered at high risk for hepatitis B if: ? You were born in a country where hepatitis B is common. Ask your health care provider which countries are considered high risk. ? Your parents were born in a high-risk country, and you have not been immunized against hepatitis B (hepatitis B vaccine). ? You have HIV or AIDS. ? You use needles to inject street drugs. ? You live with someone who has hepatitis B. ? You have had sex with someone who has hepatitis B. ? You get hemodialysis treatment. ? You take certain medicines for conditions, including cancer, organ transplantation, and autoimmune conditions.  Hepatitis C  Blood testing is recommended for: ? Everyone born from [birth year] through 1966. ? Anyone with known risk factors for hepatitis C.  Sexually transmitted infections (STIs)  You should be screened for sexually transmitted infections (STIs) including gonorrhea and chlamydia if: ? You are sexually active and are younger than 68 years of age. ? You  are older than 68 years of age and your health care provider tells you that you are at risk for this type of infection. ? Your sexual activity has changed since you were last screened and you are at an increased risk for chlamydia or gonorrhea. Ask your health care provider if you are at risk.  If you do not have HIV, but are at risk, it may be recommended that you take a prescription medicine daily to prevent HIV infection. This is called pre-exposure prophylaxis (PrEP). You are considered  at risk if: ? You are sexually active and do not regularly use condoms or know the HIV status of your partner(s). ? You take drugs by injection. ? You are sexually active with a partner who has HIV.  Talk with your health care provider about whether you are at high risk of being infected with HIV. If you choose to begin PrEP, you should first be tested for HIV. You should then be tested every 3 months for as long as you are taking PrEP. Pregnancy  If you are premenopausal and you may become pregnant, ask your health care provider about preconception counseling.  If you may become pregnant, take 400 to 800 micrograms (mcg) of folic acid every day.  If you want to prevent pregnancy, talk to your health care provider about birth control (contraception). Osteoporosis and menopause  Osteoporosis is a disease in which the bones lose minerals and strength with aging. This can result in serious bone fractures. Your risk for osteoporosis can be identified using a bone density scan.  If you are 61 years of age or older, or if you are at risk for osteoporosis and fractures, ask your health care provider if you should be screened.  Ask your health care provider whether you should take a calcium or vitamin D supplement to lower your risk for osteoporosis.  Menopause may have certain physical symptoms and risks.  Hormone replacement therapy may reduce some of these symptoms and risks. Talk to your health care  provider about whether hormone replacement therapy is right for you. Follow these instructions at home:  Schedule regular health, dental, and eye exams.  Stay current with your immunizations.  Do not use any tobacco products including cigarettes, chewing tobacco, or electronic cigarettes.  If you are pregnant, do not drink alcohol.  If you are breastfeeding, limit how much and how often you drink alcohol.  Limit alcohol intake to no more than 1 drink per day for nonpregnant women. One drink equals 12 ounces of beer, 5 ounces of wine, or 1 ounces of hard liquor.  Do not use street drugs.  Do not share needles.  Ask your health care provider for help if you need support or information about quitting drugs.  Tell your health care provider if you often feel depressed.  Tell your health care provider if you have ever been abused or do not feel safe at home. This information is not intended to replace advice given to you by your health care provider. Make sure you discuss any questions you have with your health care provider. Document Released: 09/28/2010 Document Revised: 08/21/2015 Document Reviewed: 12/17/2014 Elsevier Interactive Patient Education  [employer].

## 2018-01-25 NOTE — Assessment & Plan Note (Signed)
States fatigue She is worried about her thyroid Check TSH, CMP, CBC Continue regular exercise Work on increasing sleep with possible

## 2018-01-25 NOTE — Assessment & Plan Note (Signed)
Blood pressure slightly elevated here today, but has been well controlled the last couple of visits Continue current medication-no change CMP

## 2018-01-28 LAB — HEPATITIS C RNA QUANTITATIVE
HCV QUANT LOG: NOT DETECTED {Log_IU}/mL
HCV RNA, PCR, QN: <15 IU/mL

## 2018-01-29 ENCOUNTER — Encounter: Payer: Self-pay | Admitting: Internal Medicine

## 2018-04-07 ENCOUNTER — Other Ambulatory Visit: Payer: Self-pay | Admitting: Family Medicine

## 2018-04-17 DIAGNOSIS — L814 Other melanin hyperpigmentation: Secondary | ICD-10-CM | POA: Diagnosis not present

## 2018-04-17 DIAGNOSIS — L821 Other seborrheic keratosis: Secondary | ICD-10-CM | POA: Diagnosis not present

## 2018-04-17 DIAGNOSIS — Z23 Encounter for immunization: Secondary | ICD-10-CM | POA: Diagnosis not present

## 2018-04-17 DIAGNOSIS — D485 Neoplasm of uncertain behavior of skin: Secondary | ICD-10-CM | POA: Diagnosis not present

## 2018-04-17 DIAGNOSIS — D0352 Melanoma in situ of breast (skin) (soft tissue): Secondary | ICD-10-CM | POA: Diagnosis not present

## 2018-04-17 DIAGNOSIS — L570 Actinic keratosis: Secondary | ICD-10-CM | POA: Diagnosis not present

## 2018-04-17 DIAGNOSIS — D2221 Melanocytic nevi of right ear and external auricular canal: Secondary | ICD-10-CM | POA: Diagnosis not present

## 2018-04-25 ENCOUNTER — Ambulatory Visit: Payer: PPO | Admitting: Internal Medicine

## 2018-04-25 DIAGNOSIS — D485 Neoplasm of uncertain behavior of skin: Secondary | ICD-10-CM | POA: Diagnosis not present

## 2018-05-11 DIAGNOSIS — D0352 Melanoma in situ of breast (skin) (soft tissue): Secondary | ICD-10-CM | POA: Diagnosis not present

## 2018-06-19 ENCOUNTER — Encounter (INDEPENDENT_AMBULATORY_CARE_PROVIDER_SITE_OTHER): Payer: PPO | Admitting: Internal Medicine

## 2018-06-19 ENCOUNTER — Encounter: Payer: Self-pay | Admitting: Internal Medicine

## 2018-06-19 DIAGNOSIS — S60511A Abrasion of right hand, initial encounter: Secondary | ICD-10-CM

## 2018-06-19 DIAGNOSIS — W5503XA Scratched by cat, initial encounter: Secondary | ICD-10-CM

## 2018-06-19 MED ORDER — AMOXICILLIN-POT CLAVULANATE 875-125 MG PO TABS: 1.0000 | ORAL_TABLET | Freq: Two times a day (BID) | ORAL | 0 refills | Status: DC

## 2018-06-19 NOTE — Telephone Encounter (Signed)
Cumulative time during 7-day interval 6 minutes, there was not an associated office visit for this concern within a 7 day period.  Patient did provide consent for services prior to services given.  Names of all persons present for services: Binnie Rail, MD, patient, Shelly Flatten  Chief complaint: cat scratch with concern for infection.  Her indoor only cat scratched two nights ago on her right hand.  She states redness, warmth, pain and swelling.  Her symptoms have gotten worse and today she is not able to completely close her hand.  She tried soaking it in epsom salt-warm water.  She did have some chills, but denies fever.    History, background, results pertinent:  Past Medical History:  Diagnosis Date  . Allergy    RHINITIS  . Anxiety 1989   Dr Jake Michaelis, [place]  . Depression    Dr Toy Care  . Fibroids   . GERD 11/11/2009   Qualifier: Diagnosis of  By: Linna Darner MD, Gwyndolyn Saxon   Dysphagia once monthly on average 4-10/14 ; resolved with Prilosec OTC prn No PMH of endoscopy    . GERD (gastroesophageal reflux disease)   . Hyperlipidemia   . Hypertension     Last Td - 2011   A/P/next steps: symptoms concerning for cellulitis.  She needs to be started on antibiotics asap.  Will send Augmentin to her pharmacy.  She will monitor closely and if there is no improvement or any worsening of her symptoms she will let me know right away.

## 2018-07-17 ENCOUNTER — Ambulatory Visit: Payer: PPO | Admitting: Family Medicine

## 2018-07-18 ENCOUNTER — Ambulatory Visit (INDEPENDENT_AMBULATORY_CARE_PROVIDER_SITE_OTHER): Payer: PPO

## 2018-07-18 ENCOUNTER — Ambulatory Visit (INDEPENDENT_AMBULATORY_CARE_PROVIDER_SITE_OTHER): Payer: PPO | Admitting: Family Medicine

## 2018-07-18 ENCOUNTER — Encounter: Payer: Self-pay | Admitting: Family Medicine

## 2018-07-18 VITALS — BP 146/90 | HR 68 | Temp 98.1°F | Ht 67.0 in

## 2018-07-18 DIAGNOSIS — M79604 Pain in right leg: Secondary | ICD-10-CM | POA: Diagnosis not present

## 2018-07-18 DIAGNOSIS — M1711 Unilateral primary osteoarthritis, right knee: Secondary | ICD-10-CM

## 2018-07-18 MED ORDER — MELOXICAM 15 MG PO TABS: 15.0000 mg | ORAL_TABLET | Freq: Every day | ORAL | 0 refills | Status: DC

## 2018-07-18 NOTE — Patient Instructions (Signed)
Good to see you  Please try ice and compression on your knee  Please send me a message through MyChart with any questions or updates.

## 2018-07-18 NOTE — Assessment & Plan Note (Signed)
Pain and effusion likely result of degenerative changes as well as the degenerative meniscus. -Aspiration and injection today. -Provided Pennsaid samples. -If no improvement can consider gel injections.

## 2018-07-18 NOTE — Progress Notes (Signed)
Jasmine Bray - 68 y.o. female MRN [id number]  Date of birth: [birth year]  SUBJECTIVE:  Including CC & ROS.  Chief Complaint  Patient presents with  . Pain    right knee pain/ wants fluid drained and injection/ refill of mobic?    Jasmine Bray is a 68 y.o. female that is presenting with acute on chronic worsening of her right knee pain.  The pain is been occurring for the past few weeks.  She reports the pain after she was trying to take her boot off.  It is localized to the anterior and lateral aspect of the knee.  Denies any mechanical symptoms.  Does have swelling.  Pain is moderate.  It is localized to the knee.  It is throbbing in nature..    Review of Systems  Constitutional: Negative for fever.  HENT: Negative for congestion.   Respiratory: Negative for cough.   Cardiovascular: Negative for chest pain.  Gastrointestinal: Negative for abdominal pain.  Musculoskeletal: Positive for arthralgias, gait problem and joint swelling.  Skin: Negative for color change.  Neurological: Negative for weakness.  Hematological: Negative for adenopathy.    HISTORY: Past Medical, Surgical, Social, and Family History Reviewed & Updated per EMR.   Pertinent Historical Findings include:  Past Medical History:  Diagnosis Date  . Allergy    RHINITIS  . Anxiety 1989   Dr Jake Michaelis, [place]  . Depression    Dr Toy Care  . Fibroids   . GERD 11/11/2009   Qualifier: Diagnosis of  By: Linna Darner MD, Gwyndolyn Saxon   Dysphagia once monthly on average 4-10/14 ; resolved with Prilosec OTC prn No PMH of endoscopy    . GERD (gastroesophageal reflux disease)   . Hyperlipidemia   . Hypertension     Past Surgical History:  Procedure Laterality Date  . ABDOMINAL HYSTERECTOMY     & USO  . ANAL FISTULA ECTOMY      X 2, Dr Rosana Hoes  . COLONOSCOPY  2004, 2015   Carlean Purl  . MYOMECTOMY     FOR FIBROIDS, Dr Ubaldo Glassing  . steroid injection shoulder  05/11/2013   contusion/strain; Dr Onnie Graham    No Known Allergies  Family  History  Problem Relation Age of Onset  . Arthritis Mother   . Stroke Mother 18  . Alcohol abuse Father   . Hyperlipidemia Sister   . Hypertension Sister   . Atrial fibrillation Sister   . Breast cancer Paternal Aunt   . Diabetes Maternal Grandmother   . Heart disease Paternal Grandmother        CAD; no MI  . Stroke Paternal Grandmother        in 35s  . Stroke Paternal Grandfather        in late 77s  . Colon cancer Neg Hx      Social History   Socioeconomic History  . Marital status: Divorced    Spouse name: Not on file  . Number of children: Not on file  . Years of education: Not on file  . Highest education level: Not on file  Occupational History  . Occupation: Press photographer  Social Needs  . Financial resource strain: Not hard at all  . Food insecurity:    Worry: Never true    Inability: Never true  . Transportation needs:    Medical: No    Non-medical: No  Tobacco Use  . Smoking status: Never Smoker  . Smokeless tobacco: Never Used  Substance and Sexual Activity  . Alcohol use: Not  Currently  . Drug use: No  . Sexual activity: Not Currently  Lifestyle  . Physical activity:    Days per week: 5 days    Minutes per session: 70 min  . Stress: Only a little  Relationships  . Social connections:    Talks on phone: More than three times a week    Gets together: More than three times a week    Attends religious service: Not on file    Active member of club or organization: Not on file    Attends meetings of clubs or organizations: Not on file    Relationship status: Not on file  . Intimate partner violence:    Fear of current or ex partner: Not on file    Emotionally abused: Not on file    Physically abused: Not on file    Forced sexual activity: Not on file  Other Topics Concern  . Not on file  Social History Narrative   GETS REG EXERCISE           PHYSICAL EXAM:  VS: BP (!) 146/90   Pulse 68   Temp 98.1 F (36.7 C) (Oral)   Ht 5\' 7"  (1.702 m)   SpO2  95%   BMI 29.15 kg/m  Physical Exam Gen: NAD, alert, cooperative with exam, well-appearing ENT: normal lips, normal nasal mucosa,  Eye: normal EOM, normal conjunctiva and lids CV:  no edema, +2 pedal pulses   Resp: no accessory muscle use, non-labored,  Skin: no rashes, no areas of induration  Neuro: normal tone, normal sensation to touch Psych:  normal insight, alert and oriented MSK:  Right Knee: Normal to inspection with no erythema Obvious effusion  Palpation normal with no warmth,  TTP over the patellar tendon  TTP over the lateral joint line  ROM full in flexion and extension and lower leg rotation. Ligaments with solid consistent endpoints including  LCL, MCL. Negative Mcmurray's tests. Non painful patellar compression. Patellar glide without crepitus. Patellar and quadriceps tendons unremarkable. Hamstring and quadriceps strength is normal.  Neurovascularly intact    Limited ultrasound: right knee:  Moderate effusion in SPP  Moderate degenerative changes of the medial meniscus. Meniscal cyst on the bilateral meniscus  Summary: Degenerative changes and effusion of the right knee  Ultrasound and interpretation by Clearance Coots, MD    Aspiration/Injection Procedure Note Jasmine Bray December 13, 1950  Procedure: Injection and aspiration Indications: Right knee pain  Procedure Details Consent: Risks of procedure as well as the alternatives and risks of each were explained to the (patient/caregiver).  Consent for procedure obtained. Time Out: Verified patient identification, verified procedure, site/side was marked, verified correct patient position, special equipment/implants available, medications/allergies/relevent history reviewed, required imaging and test results available.  Performed.  The area was cleaned with iodine and alcohol swabs.    The right knee superior lateral suprapatellar pouch was injected with 3 cc of 1% lidocaine to anesthetize the skin in the  tract.  An 18-gauge syringe was inserted under ultrasound guidance.  The syringe was then switched and a mixture containing 1 cc's of 40 mg Kenalog and 4 cc's of 0.25% bupivacaine was injected.  Ultrasound was used. Images were obtained in long views showing the injection.    Amount of Fluid Aspirated: 46mL Character of Fluid: clear and straw colored Fluid was sent for:n/a  A sterile dressing was applied.  Patient did tolerate procedure well.    ASSESSMENT & PLAN:   Primary osteoarthritis of right knee Pain and effusion  likely result of degenerative changes as well as the degenerative meniscus. -Aspiration and injection today. -Provided Pennsaid samples. -If no improvement can consider gel injections.

## 2018-07-20 DIAGNOSIS — L7633 Postprocedural seroma of skin and subcutaneous tissue following a dermatologic procedure: Secondary | ICD-10-CM | POA: Diagnosis not present

## 2018-07-25 ENCOUNTER — Ambulatory Visit: Payer: Self-pay | Admitting: Internal Medicine

## 2018-07-25 NOTE — Telephone Encounter (Signed)
Seeing you tomorrow at 11:15.

## 2018-07-25 NOTE — Telephone Encounter (Signed)
Pt. Reports she started feeling bad 5 days ago - headache, sinus drainage, lethargy, loss of taste. Asking about testing. Instructed pt. We are currently not testing in the office. Would like a virtual visit with her PCP. Warm transfer to [place] in the practice.  Answer Assessment - Initial Assessment Questions 1. COVID-19 DIAGNOSIS: "Who made your Coronavirus (COVID-19) diagnosis?" "Was it confirmed by a positive lab test?" If not diagnosed by a HCP, ask "Are there lots of cases (community spread) where you live?" (See public health department website, if unsure)   * MAJOR community spread: high number of cases; numbers of cases are increasing; many people hospitalized.   * MINOR community spread: low number of cases; not increasing; few or no people hospitalized     N/a 2. ONSET: "When did the COVID-19 symptoms start?"      5 days ago 3. WORST SYMPTOM: "What is your worst symptom?" (e.g., cough, fever, shortness of breath, muscle aches)     Headache, sinus congestion, lethargy, loss of taste 4. COUGH: "How bad is the cough?"       No 5. FEVER: "Do you have a fever?" If so, ask: "What is your temperature, how was it measured, and when did it start?"     No 6. RESPIRATORY STATUS: "Describe your breathing?" (e.g., shortness of breath, wheezing, unable to speak)      No shortness of breath 7. BETTER-SAME-WORSE: "Are you getting better, staying the same or getting worse compared to yesterday?"  If getting worse, ask, "In what way?"     Same 8. HIGH RISK DISEASE: "Do you have any chronic medical problems?" (e.g., asthma, heart or lung disease, weak immune system, etc.)     No 9. PREGNANCY: "Is there any chance you are pregnant?" "When was your last menstrual period?"     No 10. OTHER SYMPTOMS: "Do you have any other symptoms?"  (e.g., runny nose, headache, sore throat, loss of smell)       No  Protocols used: CORONAVIRUS (COVID-19) DIAGNOSED OR SUSPECTED-A-AH

## 2018-07-25 NOTE — Progress Notes (Deleted)
Virtual Visit via Video Note  I connected with Jasmine Bray on 07/25/18 at 11:15 AM EDT by a video enabled telemedicine application and verified that I am speaking with the correct person using two identifiers.   I discussed the limitations of evaluation and management by telemedicine and the availability of in person appointments. The patient expressed understanding and agreed to proceed.  The patient is currently at home and I am in the office.    No referring provider.    History of Present Illness: This is an acute visit for headaches, cough, loss of taste.      Social History   Socioeconomic History  . Marital status: Divorced    Spouse name: Not on file  . Number of children: Not on file  . Years of education: Not on file  . Highest education level: Not on file  Occupational History  . Occupation: Press photographer  Social Needs  . Financial resource strain: Not hard at all  . Food insecurity:    Worry: Never true    Inability: Never true  . Transportation needs:    Medical: No    Non-medical: No  Tobacco Use  . Smoking status: Never Smoker  . Smokeless tobacco: Never Used  Substance and Sexual Activity  . Alcohol use: Not Currently  . Drug use: No  . Sexual activity: Not Currently  Lifestyle  . Physical activity:    Days per week: 5 days    Minutes per session: 70 min  . Stress: Only a little  Relationships  . Social connections:    Talks on phone: More than three times a week    Gets together: More than three times a week    Attends religious service: Not on file    Active member of club or organization: Not on file    Attends meetings of clubs or organizations: Not on file    Relationship status: Not on file  Other Topics Concern  . Not on file  Social History Narrative   GETS REG EXERCISE           Observations/Objective: Appears well in NAD   Assessment and Plan:  See Problem List for Assessment and Plan of chronic medical problems.   Follow  Up Instructions:    I discussed the assessment and treatment plan with the patient. The patient was provided an opportunity to ask questions and all were answered. The patient agreed with the plan and demonstrated an understanding of the instructions.   The patient was advised to call back or seek an in-person evaluation if the symptoms worsen or if the condition fails to improve as anticipated.    Binnie Rail, MD

## 2018-07-26 ENCOUNTER — Ambulatory Visit: Payer: PPO | Admitting: Internal Medicine

## 2018-09-21 DIAGNOSIS — Z86006 Personal history of melanoma in-situ: Secondary | ICD-10-CM | POA: Diagnosis not present

## 2018-09-21 DIAGNOSIS — D489 Neoplasm of uncertain behavior, unspecified: Secondary | ICD-10-CM | POA: Diagnosis not present

## 2018-10-13 ENCOUNTER — Other Ambulatory Visit: Payer: Self-pay | Admitting: Family Medicine

## 2018-10-26 DIAGNOSIS — Z86006 Personal history of melanoma in-situ: Secondary | ICD-10-CM | POA: Diagnosis not present

## 2018-10-26 DIAGNOSIS — L814 Other melanin hyperpigmentation: Secondary | ICD-10-CM | POA: Diagnosis not present

## 2018-10-26 DIAGNOSIS — D2221 Melanocytic nevi of right ear and external auricular canal: Secondary | ICD-10-CM | POA: Diagnosis not present

## 2018-10-26 DIAGNOSIS — L570 Actinic keratosis: Secondary | ICD-10-CM | POA: Diagnosis not present

## 2018-10-26 DIAGNOSIS — D485 Neoplasm of uncertain behavior of skin: Secondary | ICD-10-CM | POA: Diagnosis not present

## 2018-10-26 DIAGNOSIS — L910 Hypertrophic scar: Secondary | ICD-10-CM | POA: Diagnosis not present

## 2018-10-26 DIAGNOSIS — L821 Other seborrheic keratosis: Secondary | ICD-10-CM | POA: Diagnosis not present

## 2018-11-08 ENCOUNTER — Ambulatory Visit: Payer: PPO

## 2018-12-13 ENCOUNTER — Ambulatory Visit (INDEPENDENT_AMBULATORY_CARE_PROVIDER_SITE_OTHER): Payer: PPO | Admitting: *Deleted

## 2018-12-13 DIAGNOSIS — Z0000 Encounter for general adult medical examination without abnormal findings: Secondary | ICD-10-CM | POA: Diagnosis not present

## 2018-12-13 NOTE — Progress Notes (Addendum)
Subjective:   Jasmine Bray is a 68 y.o. female who presents for Medicare Annual (Subsequent) preventive examination. I connected with patient by a telephone and verified that I am speaking with the correct person using two identifiers. Patient stated full name and DOB. Patient gave permission to continue with telephonic visit. Patient's location was at home and Nurse's location was at [place] office.   Review of Systems:   Cardiac Risk Factors include: advanced age (>87men, >57 women);dyslipidemia;hypertension Sleep patterns: gets up 0-1 times nightly to void and sleeps 8 hours nightly.    Home Safety/Smoke Alarms: Feels safe in home. Smoke alarms in place.  Living environment; residence and Firearm Safety: 1-story house/ trailer. Lives alone, no needs for DME, good support system Seat Belt Safety/Bike Helmet: Wears seat belt.      Objective:     Vitals: There were no vitals taken for this visit.  There is no height or weight on file to calculate BMI.  Advanced Directives 12/13/2018 11/01/2017 10/21/2016 09/11/2013  Does Patient Have a Medical Advance Directive? No No No Patient does not have advance directive  Does patient want to make changes to medical advance directive? No - Patient declined - - -  Would patient like information on creating a medical advance directive? - Yes (ED - Information included in AVS) Yes (ED - Information included in AVS) -  Pre-existing out of facility DNR order (yellow form or pink MOST form) - - - No    Tobacco Social History   Tobacco Use  Smoking Status Never Smoker  Smokeless Tobacco Never Used     Counseling given: Not Answered  Past Medical History:  Diagnosis Date  . Allergy    RHINITIS  . Anxiety 1989   Dr Jake Michaelis, [place]  . Depression    Dr Toy Care  . Fibroids   . GERD 11/11/2009   Qualifier: Diagnosis of  By: Linna Darner MD, Gwyndolyn Saxon   Dysphagia once monthly on average 4-10/14 ; resolved with Prilosec OTC prn No PMH of endoscopy    . GERD  (gastroesophageal reflux disease)   . Hyperlipidemia   . Hypertension    Past Surgical History:  Procedure Laterality Date  . ABDOMINAL HYSTERECTOMY     & USO  . ANAL FISTULA ECTOMY      X 2, Dr Rosana Hoes  . COLONOSCOPY  2004, 2015   Carlean Purl  . INJECTION KNEE Bilateral    [facility]  . MYOMECTOMY     FOR FIBROIDS, Dr Ubaldo Glassing  . steroid injection shoulder Right 05/11/2013   contusion/strain; Dr Onnie Graham   Family History  Problem Relation Age of Onset  . Arthritis Mother   . Stroke Mother 61  . Alcohol abuse Father   . Hyperlipidemia Sister   . Hypertension Sister   . Atrial fibrillation Sister   . Breast cancer Paternal Aunt   . Diabetes Maternal Grandmother   . Heart disease Paternal Grandmother        CAD; no MI  . Stroke Paternal Grandmother        in 21s  . Stroke Paternal Grandfather        in late 32s  . Colon cancer Neg Hx    Social History   Socioeconomic History  . Marital status: Divorced    Spouse name: Not on file  . Number of children: Not on file  . Years of education: Not on file  . Highest education level: Not on file  Occupational History  . Occupation: Press photographer  Social Needs  . Financial resource strain: Not hard at all  . Food insecurity    Worry: Never true    Inability: Never true  . Transportation needs    Medical: No    Non-medical: No  Tobacco Use  . Smoking status: Never Smoker  . Smokeless tobacco: Never Used  Substance and Sexual Activity  . Alcohol use: Yes    Alcohol/week: 7.0 - 10.0 standard drinks    Types: 7 - 10 Glasses of  per week    Comment: 1-2 galsses of  daily  . Drug use: No  . Sexual activity: Not Currently  Lifestyle  . Physical activity    Days per week: 5 days    Minutes per session: 50 min  . Stress: Only a little  Relationships  . Social connections    Talks on phone: More than three times a week    Gets together: More than three times a week    Attends religious service: 1 to 4 times per year    Active  member of club or organization: Yes    Attends meetings of clubs or organizations: More than 4 times per year    Relationship status: Not on file  Other Topics Concern  . Not on file  Social History Narrative   GETS REG EXERCISE          Outpatient Encounter Medications as of 12/13/2018  Medication Sig  . ALPRAZolam (XANAX) 1 MG tablet Take 1 mg by mouth as needed.  Marland Kitchen aspirin EC 81 MG tablet Take 81 mg by mouth daily.  . Biotin 1 MG CAPS Take 1 tablet by mouth daily.  Marland Kitchen buPROPion (WELLBUTRIN XL) 150 MG 24 hr tablet Take 150 mg by mouth daily.  . Cholecalciferol (VITAMIN D3) 3000 UNITS TABS Take 1 capsule by mouth daily. Taking 1000 units daily  . escitalopram (LEXAPRO) 20 MG tablet Take 10 mg by mouth daily.   Marland Kitchen estradiol (ESTRACE) 0.1 MG/GM vaginal cream Place 2 g vaginally. 1 GRAM 3x A WEEK   . Garlic 123XX123 MG CAPS Take 1 capsule by mouth daily.  . hydrochlorothiazide (HYDRODIURIL) 25 MG tablet Take 1 tablet (25 mg total) by mouth daily.  . Misc Natural Products (COSAMIN ASU ADVANCED FORMULA PO) Take by mouth 2 (two) times daily.  . Multiple Vitamin (MULTIVITAMIN) tablet Take 1 tablet by mouth daily.  . Omega-3 Fatty Acids (FISH OIL) 1000 MG CAPS Take by mouth daily.  . Probiotic Product (PROBIOTIC PEARLS PO) Take 1 tablet by mouth daily.  . simvastatin (ZOCOR) 40 MG tablet Take 1 tablet (40 mg total) by mouth at bedtime.  . Turmeric 500 MG CAPS Take 500 mg by mouth daily.  . valACYclovir (VALTREX) 1000 MG tablet Take 1,000 mg by mouth. 1/2 by mouth once daily  . [DISCONTINUED] amoxicillin-clavulanate (AUGMENTIN) 875-125 MG tablet Take 1 tablet by mouth 2 (two) times daily. (Patient not taking: Reported on 12/13/2018)  . [DISCONTINUED] b complex vitamins tablet Take 1 tablet by mouth daily.  . [DISCONTINUED] gabapentin (NEURONTIN) 100 MG capsule TAKE 1 CAPSULE BY MOUTH THREE TIMES A DAY (Patient not taking: Reported on 12/13/2018)  . [DISCONTINUED] meloxicam (MOBIC) 15 MG tablet TAKE 1  TABLET BY MOUTH EVERY DAY (Patient not taking: Reported on 12/13/2018)   No facility-administered encounter medications on file as of 12/13/2018.     Activities of Daily Living In your present state of health, do you have any difficulty performing the following activities: 12/13/2018  Hearing? N  Vision? N  Difficulty concentrating or making decisions? N  Walking or climbing stairs? N  Dressing or bathing? N  Doing errands, shopping? N  Preparing Food and eating ? N  Using the Toilet? N  In the past six months, have you accidently leaked urine? N  Do you have problems with loss of bowel control? N  Managing your Medications? N  Managing your Finances? N  Housekeeping or managing your Housekeeping? N  Some recent data might be hidden    Patient Care Team: Binnie Rail, MD as PCP - General (Internal Medicine) Rosemarie Ax, MD as Consulting Physician (Family Medicine) Chucky May, MD as Consulting Physician (Psychiatry)    Assessment:   This is a routine wellness examination for Jasmine Bray. Physical assessment deferred to PCP.   Exercise Activities and Dietary recommendations Current Exercise Habits: Home exercise routine, Type of exercise: walking;calisthenics, Time (Minutes): 50, Frequency (Times/Week): 4, Weekly Exercise (Minutes/Week): 200, Intensity: Mild, Exercise limited by: orthopedic condition(s)  Diet (meal preparation, eat out, water intake, caffeinated beverages, dairy products, fruits and vegetables): in general, a "healthy" diet  , well balanced. eats a variety of fruits and vegetables daily, limits salt, fat/cholesterol, sugar,carbohydrates,caffeine, drinks 6-8 glasses of water daily.  Goals    . Patient Stated     Ride my horses every weekend and go horse camping more often. Continue to eat healthy, exercise, swim, enjoy life and family.    . Stay as healthy as possible      Continue to eat healthy, exercise, and enjoy life       Fall Risk Fall Risk   12/13/2018 11/01/2017 10/24/2017 10/21/2016 01/22/2016  Falls in the past year? 0 No No No Yes  Number falls in past yr: 0 - - - 2 or more  Injury with Fall? 0 - - - No   Depression Screen PHQ 2/9 Scores 12/13/2018 11/01/2017 10/21/2016 01/22/2016  PHQ - 2 Score 0 0 2 0  PHQ- 9 Score 0 1 3 -     Cognitive Function       Ad8 score reviewed for issues:  Issues making decisions: no  Less interest in hobbies / activities: no  Repeats questions, stories (family complaining): no  Trouble using ordinary gadgets (microwave, computer, phone):no  Forgets the month or year: no  Mismanaging finances: no  Remembering appts: no  Daily problems with thinking and/or memory: no Ad8 score is= 0  Immunization History  Administered Date(s) Administered  . Influenza Split 02/02/2011, 01/10/2012  . Influenza Whole 12/22/2009  . Influenza, High Dose Seasonal PF 01/22/2016, 01/24/2017, 01/25/2018  . Influenza,inj,Quad PF,6+ Mos 01/17/2014  . Pneumococcal Conjugate-13 03/08/2016  . Td 11/11/2009  . Zoster 01/20/2015   Screening Tests Health Maintenance  Topic Date Due  . PNA vac Low Risk Adult (2 of 2 - PPSV23) 03/08/2017  . INFLUENZA VACCINE  10/28/2018  . TETANUS/TDAP  11/12/2019  . MAMMOGRAM  12/14/2019  . DEXA SCAN  12/02/2022  . COLONOSCOPY  09/26/2023  . Hepatitis C Screening  Completed      Plan:   Reviewed health maintenance screenings with patient today and relevant education, vaccines, and/or referrals were provided.   I have personally reviewed and noted the following in the patient's chart:   . Medical and social history . Use of alcohol, tobacco or illicit drugs  . Current medications and supplements . Functional ability and status . Nutritional status . Physical activity . Advanced directives . List of other physicians . Screenings to  include cognitive, depression, and falls . Referrals and appointments  In addition, I have reviewed and discussed with patient  certain preventive protocols, quality metrics, and best practice recommendations. A written personalized care plan for preventive services as well as general preventive health recommendations were provided to patient.     Michiel Cowboy, RN  12/13/2018    Medical screening examination/treatment/procedure(s) were performed by non-physician practitioner and as supervising physician I was immediately available for consultation/collaboration. I agree with above. Binnie Rail, MD

## 2018-12-25 DIAGNOSIS — Z20828 Contact with and (suspected) exposure to other viral communicable diseases: Secondary | ICD-10-CM | POA: Diagnosis not present

## 2018-12-25 DIAGNOSIS — R5383 Other fatigue: Secondary | ICD-10-CM | POA: Diagnosis not present

## 2018-12-26 ENCOUNTER — Encounter: Payer: Self-pay | Admitting: Internal Medicine

## 2018-12-26 ENCOUNTER — Ambulatory Visit (INDEPENDENT_AMBULATORY_CARE_PROVIDER_SITE_OTHER): Payer: PPO | Admitting: Internal Medicine

## 2018-12-26 DIAGNOSIS — B349 Viral infection, unspecified: Secondary | ICD-10-CM | POA: Insufficient documentation

## 2018-12-26 NOTE — Assessment & Plan Note (Signed)
Her symptoms are consistent with a viral illness. COVID negative yesterday She may consider getting another COVID test Possible flu Her symptoms definitely seem viral in nature and will continue with symptomatic treatment If her symptoms worsen she will let me know or return to urgent care for further evaluation

## 2018-12-26 NOTE — Progress Notes (Signed)
Virtual Visit via Video Note  I connected with Jasmine Bray on 12/26/18 at  3:45 PM EDT by a video enabled telemedicine application and verified that I am speaking with the correct person using two identifiers.   I discussed the limitations of evaluation and management by telemedicine and the availability of in person appointments. The patient expressed understanding and agreed to proceed.  The patient is currently at home and I am in the office.    No referring provider.    History of Present Illness: She is here for an acute visit for cold symptoms.   Her symptoms started one week ago.  She is experiencing chills, significant fatigue, sore throat, tinnitus, slight change in taste, constipation, joint pain, muscle aches and headaches.  She denies any fevers.  She has not had any coughing, wheezing or shortness of breath.  She denies nausea or skin changes.  She has tried taking ibuprofen.   She had a COVID test yesterday and it is negative.  She got that done at urgent care and her vital signs were all good.  She denies sick contacts.  She denies known tick bites.   She states she does feel better, but still is having significant symptoms.  She has a decreased appetite, but is still eating.  She is drinking plenty of fluids.   Review of Systems  Constitutional: Positive for chills and malaise/fatigue. Negative for fever.  HENT: Positive for sore throat and tinnitus. Negative for congestion, ear pain and sinus pain.        Slight change in taste, No change smell  Respiratory: Negative for cough, shortness of breath and wheezing.   Gastrointestinal: Positive for constipation. Negative for diarrhea and nausea.  Musculoskeletal: Positive for joint pain and myalgias.  Skin: Negative for rash.  Neurological: Positive for headaches.     Social History   Socioeconomic History  . Marital status: Divorced    Spouse name: Not on file  . Number of children: Not on file  . Years of  education: Not on file  . Highest education level: Not on file  Occupational History  . Occupation: Press photographer  Social Needs  . Financial resource strain: Not hard at all  . Food insecurity    Worry: Never true    Inability: Never true  . Transportation needs    Medical: No    Non-medical: No  Tobacco Use  . Smoking status: Never Smoker  . Smokeless tobacco: Never Used  Substance and Sexual Activity  . Alcohol use: Yes    Alcohol/week: 7.0 - 10.0 standard drinks    Types: 7 - 10 Glasses of wine per week    Comment: 1-2 galsses of wine daily  . Drug use: No  . Sexual activity: Not Currently  Lifestyle  . Physical activity    Days per week: 5 days    Minutes per session: 50 min  . Stress: Only a little  Relationships  . Social connections    Talks on phone: More than three times a week    Gets together: More than three times a week    Attends religious service: 1 to 4 times per year    Active member of club or organization: Yes    Attends meetings of clubs or organizations: More than 4 times per year    Relationship status: Not on file  Other Topics Concern  . Not on file  Social History Narrative   GETS REG EXERCISE  Observations/Objective: Appears well in NAD Breathing normally  Assessment and Plan:  See Problem List for Assessment and Plan of chronic medical problems.   Follow Up Instructions:    I discussed the assessment and treatment plan with the patient. The patient was provided an opportunity to ask questions and all were answered. The patient agreed with the plan and demonstrated an understanding of the instructions.   The patient was advised to call back or seek an in-person evaluation if the symptoms worsen or if the condition fails to improve as anticipated.    Binnie Rail, MD

## 2019-01-01 DIAGNOSIS — Z1159 Encounter for screening for other viral diseases: Secondary | ICD-10-CM | POA: Diagnosis not present

## 2019-01-17 DIAGNOSIS — Z1231 Encounter for screening mammogram for malignant neoplasm of breast: Secondary | ICD-10-CM | POA: Diagnosis not present

## 2019-01-17 DIAGNOSIS — Z79890 Hormone replacement therapy (postmenopausal): Secondary | ICD-10-CM | POA: Diagnosis not present

## 2019-01-17 DIAGNOSIS — Z130 Encounter for screening for diseases of the blood and blood-forming organs and certain disorders involving the immune mechanism: Secondary | ICD-10-CM | POA: Diagnosis not present

## 2019-01-17 DIAGNOSIS — Z6825 Body mass index (BMI) 25.0-25.9, adult: Secondary | ICD-10-CM | POA: Diagnosis not present

## 2019-01-17 DIAGNOSIS — Z1389 Encounter for screening for other disorder: Secondary | ICD-10-CM | POA: Diagnosis not present

## 2019-01-17 DIAGNOSIS — Z01419 Encounter for gynecological examination (general) (routine) without abnormal findings: Secondary | ICD-10-CM | POA: Diagnosis not present

## 2019-01-28 NOTE — Patient Instructions (Signed)
Tests ordered today. Your results will be released to [facility] (or called to you) after review.  If any changes need to be made, you will be notified at that same time.  All other Health Maintenance issues reviewed.   All recommended immunizations and age-appropriate screenings are up-to-date or discussed.  No immunization administered today.   Medications reviewed and updated.  Changes include :     Your prescription(s) have been submitted to your pharmacy. Please take as directed and contact our office if you believe you are having problem(s) with the medication(s).  A referral was ordered for   Please followup in 1 year    Health Maintenance, Female Adopting a healthy lifestyle and getting preventive care are important in promoting health and wellness. Ask your health care provider about:  The right schedule for you to have regular tests and exams.  Things you can do on your own to prevent diseases and keep yourself healthy. What should I know about diet, weight, and exercise? Eat a healthy diet   Eat a diet that includes plenty of vegetables, fruits, low-fat dairy products, and lean protein.  Do not eat a lot of foods that are high in solid fats, added sugars, or sodium. Maintain a healthy weight Body mass index (BMI) is used to identify weight problems. It estimates body fat based on height and weight. Your health care provider can help determine your BMI and help you achieve or maintain a healthy weight. Get regular exercise Get regular exercise. This is one of the most important things you can do for your health. Most adults should:  Exercise for at least 150 minutes each week. The exercise should increase your heart rate and make you sweat (moderate-intensity exercise).  Do strengthening exercises at least twice a week. This is in addition to the moderate-intensity exercise.  Spend less time sitting. Even light physical activity can be beneficial. Watch cholesterol and  blood lipids Have your blood tested for lipids and cholesterol at 69 years of age, then have this test every 5 years. Have your cholesterol levels checked more often if:  Your lipid or cholesterol levels are high.  You are older than 69 years of age.  You are at high risk for heart disease. What should I know about cancer screening? Depending on your health history and family history, you may need to have cancer screening at various ages. This may include screening for:  Breast cancer.  Cervical cancer.  Colorectal cancer.  Skin cancer.  Lung cancer. What should I know about heart disease, diabetes, and high blood pressure? Blood pressure and heart disease  High blood pressure causes heart disease and increases the risk of stroke. This is more likely to develop in people who have high blood pressure readings, are of African descent, or are overweight.  Have your blood pressure checked: ? Every 3-5 years if you are 56-34 years of age. ? Every year if you are 50 years old or older. Diabetes Have regular diabetes screenings. This checks your fasting blood sugar level. Have the screening done:  Once every three years after age 34 if you are at a normal weight and have a low risk for diabetes.  More often and at a younger age if you are overweight or have a high risk for diabetes. What should I know about preventing infection? Hepatitis B If you have a higher risk for hepatitis B, you should be screened for this virus. Talk with your health care provider to find  out if you are at risk for hepatitis B infection. Hepatitis C Testing is recommended for:  Everyone born from [birth year] through 1966.  Anyone with known risk factors for hepatitis C. Sexually transmitted infections (STIs)  Get screened for STIs, including gonorrhea and chlamydia, if: ? You are sexually active and are younger than 69 years of age. ? You are older than 69 years of age and your health care provider tells  you that you are at risk for this type of infection. ? Your sexual activity has changed since you were last screened, and you are at increased risk for chlamydia or gonorrhea. Ask your health care provider if you are at risk.  Ask your health care provider about whether you are at high risk for HIV. Your health care provider may recommend a prescription medicine to help prevent HIV infection. If you choose to take medicine to prevent HIV, you should first get tested for HIV. You should then be tested every 3 months for as long as you are taking the medicine. Pregnancy  If you are about to stop having your period (premenopausal) and you may become pregnant, seek counseling before you get pregnant.  Take 400 to 800 micrograms (mcg) of folic acid every day if you become pregnant.  Ask for birth control (contraception) if you want to prevent pregnancy. Osteoporosis and menopause Osteoporosis is a disease in which the bones lose minerals and strength with aging. This can result in bone fractures. If you are 34 years old or older, or if you are at risk for osteoporosis and fractures, ask your health care provider if you should:  Be screened for bone loss.  Take a calcium or vitamin D supplement to lower your risk of fractures.  Be given hormone replacement therapy (HRT) to treat symptoms of menopause. Follow these instructions at home: Lifestyle  Do not use any products that contain nicotine or tobacco, such as cigarettes, e-cigarettes, and chewing tobacco. If you need help quitting, ask your health care provider.  Do not use street drugs.  Do not share needles.  Ask your health care provider for help if you need support or information about quitting drugs. Alcohol use  Do not drink alcohol if: ? Your health care provider tells you not to drink. ? You are pregnant, may be pregnant, or are planning to become pregnant.  If you drink alcohol: ? Limit how much you use to 0-1 drink a day. ?  Limit intake if you are breastfeeding.  Be aware of how much alcohol is in your drink. In the [place] one drink equals one 12 oz bottle of beer (355 mL), one 5 oz glass of wine (148 mL), or one 1 oz glass of hard liquor (44 mL). General instructions  Schedule regular health, dental, and eye exams.  Stay current with your vaccines.  Tell your health care provider if: ? You often feel depressed. ? You have ever been abused or do not feel safe at home. Summary  Adopting a healthy lifestyle and getting preventive care are important in promoting health and wellness.  Follow your health care provider's instructions about healthy diet, exercising, and getting tested or screened for diseases.  Follow your health care provider's instructions on monitoring your cholesterol and blood pressure. This information is not intended to replace advice given to you by your health care provider. Make sure you discuss any questions you have with your health care provider. Document Released: 09/28/2010 Document Revised: 03/08/2018 Document Reviewed: 03/08/2018 Elsevier  Patient Education  [employer].

## 2019-01-28 NOTE — Progress Notes (Signed)
Subjective:    Patient ID: Jasmine Bray, female    DOB: March 05, 1951, 68 y.o.   MRN: [id number]  HPI She is here for a physical exam.     Medications and allergies reviewed with patient and updated if appropriate.  Patient Active Problem List   Diagnosis Date Noted  . Viral illness 12/26/2018  . Primary osteoarthritis of right knee 07/18/2018  . Fatigue 01/25/2018  . Sciatica of left side 10/10/2017  . Chronic knee pain 07/25/2017  . Hamstring strain 05/20/2017  . Rib pain on right side 02/01/2017  . Right hip pain 02/01/2017  . Left upper arm pain 03/17/2016  . Hepatitis C antibody test positive 01/29/2016  . Acute pain of right shoulder 04/24/2013  . Fasting hyperglycemia 01/10/2012  . Hyperlipidemia 11/11/2009  . 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 Essential hypertension 11/11/2009    Current Outpatient Medications on File Prior to Visit  Medication Sig Dispense Refill  . ALPRAZolam (XANAX) 1 MG tablet Take 1 mg by mouth as needed.    Marland Kitchen aspirin EC 81 MG tablet Take 81 mg by mouth daily.    . Biotin 1 MG CAPS Take 1 tablet by mouth daily.    Marland Kitchen buPROPion (WELLBUTRIN XL) 150 MG 24 hr tablet Take 150 mg by mouth daily.    . Cholecalciferol (VITAMIN D3) 3000 UNITS TABS Take 1 capsule by mouth daily. Taking 1000 units daily    . escitalopram (LEXAPRO) 20 MG tablet Take 10 mg by mouth daily.     Marland Kitchen estradiol (ESTRACE) 0.1 MG/GM vaginal cream Place 2 g vaginally. 1 GRAM 3x A WEEK     . Garlic 123XX123 MG CAPS Take 1 capsule by mouth daily.    . hydrochlorothiazide (HYDRODIURIL) 25 MG tablet Take 1 tablet (25 mg total) by mouth daily. 90 tablet 3  . Misc Natural Products (COSAMIN ASU ADVANCED FORMULA PO) Take by mouth 2 (two) times daily.    . Multiple Vitamin (MULTIVITAMIN) tablet Take 1 tablet by mouth daily.    . Omega-3 Fatty Acids (FISH OIL) 1000 MG CAPS Take by mouth daily.    . Probiotic Product (PROBIOTIC PEARLS PO) Take 1 tablet by mouth daily.    .  simvastatin (ZOCOR) 40 MG tablet Take 1 tablet (40 mg total) by mouth at bedtime. 90 tablet 3  . Turmeric 500 MG CAPS Take 500 mg by mouth daily.    . valACYclovir (VALTREX) 1000 MG tablet Take 1,000 mg by mouth. 1/2 by mouth once daily     No current facility-administered medications on file prior to visit.     Past Medical History:  Diagnosis Date  . Allergy    RHINITIS  . Anxiety 1989   Dr Jake Michaelis, [place]  . Depression    Dr Toy Care  . Fibroids   . GERD 11/11/2009   Qualifier: Diagnosis of  By: Linna Darner MD, Gwyndolyn Saxon   Dysphagia once monthly on average 4-10/14 ; resolved with Prilosec OTC prn No PMH of endoscopy    . GERD (gastroesophageal reflux disease)   . Hyperlipidemia   . Hypertension     Past Surgical History:  Procedure Laterality Date  . ABDOMINAL HYSTERECTOMY     & USO  . ANAL FISTULA ECTOMY      X 2, Dr Rosana Hoes  . COLONOSCOPY  2004, 2015   Carlean Purl  . INJECTION KNEE Bilateral    [facility]flex  . MYOMECTOMY     FOR FIBROIDS, Dr Ubaldo Glassing  . steroid injection  shoulder Right 05/11/2013   contusion/strain; Dr Onnie Graham    Social History   Socioeconomic History  . Marital status: Divorced    Spouse name: Not on file  . Number of children: Not on file  . Years of education: Not on file  . Highest education level: Not on file  Occupational History  . Occupation: Press photographer  Social Needs  . Financial resource strain: Not hard at all  . Food insecurity    Worry: Never true    Inability: Never true  . Transportation needs    Medical: No    Non-medical: No  Tobacco Use  . Smoking status: Never Smoker  . Smokeless tobacco: Never Used  Substance and Sexual Activity  . Alcohol use: Yes    Alcohol/week: 7.0 - 10.0 standard drinks    Types: 7 - 10 Glasses of wine per week    Comment: 1-2 galsses of wine daily  . Drug use: No  . Sexual activity: Not Currently  Lifestyle  . Physical activity    Days per week: 5 days    Minutes per session: 50 min  . Stress: Only a little   Relationships  . Social connections    Talks on phone: More than three times a week    Gets together: More than three times a week    Attends religious service: 1 to 4 times per year    Active member of club or organization: Yes    Attends meetings of clubs or organizations: More than 4 times per year    Relationship status: Not on file  Other Topics Concern  . Not on file  Social History Narrative   GETS REG EXERCISE          Family History  Problem Relation Age of Onset  . Arthritis Mother   . Stroke Mother 75  . Alcohol abuse Father   . Hyperlipidemia Sister   . Hypertension Sister   . Atrial fibrillation Sister   . Breast cancer Paternal Aunt   . Diabetes Maternal Grandmother   . Heart disease Paternal Grandmother        CAD; no MI  . Stroke Paternal Grandmother        in 78s  . Stroke Paternal Grandfather        in late 50s  . Colon cancer Neg Hx     Review of Systems     Objective:  There were no vitals filed for this visit. There were no vitals filed for this visit. There is no height or weight on file to calculate BMI.  BP Readings from Last 3 Encounters:  07/18/18 (!) 146/90  01/25/18 (!) 142/90  11/01/17 124/76    Wt Readings from Last 3 Encounters:  01/25/18 186 lb 1.9 oz (84.4 kg)  11/01/17 180 lb (81.6 kg)  02/01/17 188 lb (85.3 kg)     Physical Exam Constitutional: She appears well-developed and well-nourished. No distress.  HENT:  Head: Normocephalic and atraumatic.  Right Ear: External ear normal. Normal ear canal and TM Left Ear: External ear normal.  Normal ear canal and TM Mouth/Throat: Oropharynx is clear and moist.  Eyes: Conjunctivae and EOM are normal.  Neck: Neck supple. No tracheal deviation present. No thyromegaly present.  No carotid bruit  Cardiovascular: Normal rate, regular rhythm and normal heart sounds.   No murmur heard.  No edema. Pulmonary/Chest: Effort normal and breath sounds normal. No respiratory distress. She  has no wheezes. She has no rales.  Breast:  deferred   Abdominal: Soft. She exhibits no distension. There is no tenderness.  Lymphadenopathy: She has no cervical adenopathy.  Skin: Skin is warm and dry. She is not diaphoretic.  Psychiatric: She has a normal mood and affect. Her behavior is normal.        Assessment & Plan:   Physical exam: Screening blood work    ordered Immunizations  PPSV 23 due, flu due, shingrix discussed Colonoscopy  Up to date  Mammogram  Up to date  Gyn   Dexa   Up to date  Eye exams   Exercise  Rides horses, crossfit Weight   Substance abuse    none  See Problem List for Assessment and Plan of chronic medical problems.   FU in one year    This encounter was created in error - please disregard.

## 2019-01-29 ENCOUNTER — Encounter: Payer: PPO | Admitting: Internal Medicine

## 2019-02-13 ENCOUNTER — Other Ambulatory Visit: Payer: Self-pay

## 2019-02-13 MED ORDER — HYDROCHLOROTHIAZIDE 25 MG PO TABS: 25.0000 mg | ORAL_TABLET | Freq: Every day | ORAL | 3 refills | Status: DC

## 2019-02-19 NOTE — Patient Instructions (Addendum)
Tests ordered today. Your results will be released to [place] (or called to you) after review.  If any changes need to be made, you will be notified at that same time.  All other Health Maintenance issues reviewed.   All recommended immunizations and age-appropriate screenings are up-to-date or discussed.  Pneumonia immunization administered today.   Medications reviewed and updated.  Changes include :   meloxicam 7.5 mg twice daily as needed.     Your prescription(s) have been submitted to your pharmacy. Please take as directed and contact our office if you believe you are having problem(s) with the medication(s).   Please followup in 1 year     Health Maintenance, Female Adopting a healthy lifestyle and getting preventive care are important in promoting health and wellness. Ask your health care provider about:  The right schedule for you to have regular tests and exams.  Things you can do on your own to prevent diseases and keep yourself healthy. What should I know about diet, weight, and exercise? Eat a healthy diet   Eat a diet that includes plenty of vegetables, fruits, low-fat dairy products, and lean protein.  Do not eat a lot of foods that are high in solid fats, added sugars, or sodium. Maintain a healthy weight Body mass index (BMI) is used to identify weight problems. It estimates body fat based on height and weight. Your health care provider can help determine your BMI and help you achieve or maintain a healthy weight. Get regular exercise Get regular exercise. This is one of the most important things you can do for your health. Most adults should:  Exercise for at least 150 minutes each week. The exercise should increase your heart rate and make you sweat (moderate-intensity exercise).  Do strengthening exercises at least twice a week. This is in addition to the moderate-intensity exercise.  Spend less time sitting. Even light physical activity can be beneficial.  Watch cholesterol and blood lipids Have your blood tested for lipids and cholesterol at 68 years of age, then have this test every 5 years. Have your cholesterol levels checked more often if:  Your lipid or cholesterol levels are high.  You are older than 69 years of age.  You are at high risk for heart disease. What should I know about cancer screening? Depending on your health history and family history, you may need to have cancer screening at various ages. This may include screening for:  Breast cancer.  Cervical cancer.  Colorectal cancer.  Skin cancer.  Lung cancer. What should I know about heart disease, diabetes, and high blood pressure? Blood pressure and heart disease  High blood pressure causes heart disease and increases the risk of stroke. This is more likely to develop in people who have high blood pressure readings, are of African descent, or are overweight.  Have your blood pressure checked: ? Every 3-5 years if you are 36-71 years of age. ? Every year if you are 59 years old or older. Diabetes Have regular diabetes screenings. This checks your fasting blood sugar level. Have the screening done:  Once every three years after age 54 if you are at a normal weight and have a low risk for diabetes.  More often and at a younger age if you are overweight or have a high risk for diabetes. What should I know about preventing infection? Hepatitis B If you have a higher risk for hepatitis B, you should be screened for this virus. Talk with your health  care provider to find out if you are at risk for hepatitis B infection. Hepatitis C Testing is recommended for:  Everyone born from [birth year] through 1966.  Anyone with known risk factors for hepatitis C. Sexually transmitted infections (STIs)  Get screened for STIs, including gonorrhea and chlamydia, if: ? You are sexually active and are younger than 69 years of age. ? You are older than 69 years of age and your health  care provider tells you that you are at risk for this type of infection. ? Your sexual activity has changed since you were last screened, and you are at increased risk for chlamydia or gonorrhea. Ask your health care provider if you are at risk.  Ask your health care provider about whether you are at high risk for HIV. Your health care provider may recommend a prescription medicine to help prevent HIV infection. If you choose to take medicine to prevent HIV, you should first get tested for HIV. You should then be tested every 3 months for as long as you are taking the medicine. Pregnancy  If you are about to stop having your period (premenopausal) and you may become pregnant, seek counseling before you get pregnant.  Take 400 to 800 micrograms (mcg) of folic acid every day if you become pregnant.  Ask for birth control (contraception) if you want to prevent pregnancy. Osteoporosis and menopause Osteoporosis is a disease in which the bones lose minerals and strength with aging. This can result in bone fractures. If you are 33 years old or older, or if you are at risk for osteoporosis and fractures, ask your health care provider if you should:  Be screened for bone loss.  Take a calcium or vitamin D supplement to lower your risk of fractures.  Be given hormone replacement therapy (HRT) to treat symptoms of menopause. Follow these instructions at home: Lifestyle  Do not use any products that contain nicotine or tobacco, such as cigarettes, e-cigarettes, and chewing tobacco. If you need help quitting, ask your health care provider.  Do not use street drugs.  Do not share needles.  Ask your health care provider for help if you need support or information about quitting drugs. Alcohol use  Do not drink alcohol if: ? Your health care provider tells you not to drink. ? You are pregnant, may be pregnant, or are planning to become pregnant.  If you drink alcohol: ? Limit how much you use to  0-1 drink a day. ? Limit intake if you are breastfeeding.  Be aware of how much alcohol is in your drink. In the [place] one drink equals one 12 oz bottle of beer (355 mL), one 5 oz glass of wine (148 mL), or one 1 oz glass of hard liquor (44 mL). General instructions  Schedule regular health, dental, and eye exams.  Stay current with your vaccines.  Tell your health care provider if: ? You often feel depressed. ? You have ever been abused or do not feel safe at home. Summary  Adopting a healthy lifestyle and getting preventive care are important in promoting health and wellness.  Follow your health care provider's instructions about healthy diet, exercising, and getting tested or screened for diseases.  Follow your health care provider's instructions on monitoring your cholesterol and blood pressure. This information is not intended to replace advice given to you by your health care provider. Make sure you discuss any questions you have with your health care provider. Document Released: 09/28/2010 Document Revised: 03/08/2018  Document Reviewed: 03/08/2018 Elsevier Patient Education  [employer].

## 2019-02-19 NOTE — Progress Notes (Signed)
Subjective:    Patient ID: Jasmine Bray, female    DOB: 03/25/1951, 67 y.o.   MRN: [id number]  HPI She is here for a physical exam.   She has been trying to lose weight.  She gained weight over the past year and is frustrated by her weight.  She stopped drinking wine after her GYN appt last month.  She is active, but not exercising regularly.   Medications and allergies reviewed with patient and updated if appropriate.  Patient Active Problem List   Diagnosis Date Noted  . Primary osteoarthritis of right knee 07/18/2018  . Fatigue 01/25/2018  . Sciatica of left side 10/10/2017  . Chronic knee pain 07/25/2017  . Hamstring strain 05/20/2017  . Rib pain on right side 02/01/2017  . Right hip pain 02/01/2017  . Left upper arm pain 03/17/2016  . Hepatitis C antibody test positive 01/29/2016  . Acute pain of right shoulder 04/24/2013  . Fasting hyperglycemia 01/10/2012  . Hyperlipidemia 11/11/2009  . 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 Essential hypertension 11/11/2009    Current Outpatient Medications on File Prior to Visit  Medication Sig Dispense Refill  . ALPRAZolam (XANAX) 1 MG tablet Take 1 mg by mouth as needed.    Marland Kitchen aspirin EC 81 MG tablet Take 81 mg by mouth daily.    . Biotin 1 MG CAPS Take 1 tablet by mouth daily.    Marland Kitchen buPROPion (WELLBUTRIN XL) 150 MG 24 hr tablet Take 150 mg by mouth daily.    . Cholecalciferol (VITAMIN D3) 3000 UNITS TABS Take 1 capsule by mouth daily. Taking 1000 units daily    . escitalopram (LEXAPRO) 20 MG tablet Take 10 mg by mouth daily.     Marland Kitchen estradiol (ESTRACE) 0.1 MG/GM vaginal cream Place 2 g vaginally. 1 GRAM 3x A WEEK     . Garlic 123XX123 MG CAPS Take 1 capsule by mouth daily.    . hydrochlorothiazide (HYDRODIURIL) 25 MG tablet Take 1 tablet (25 mg total) by mouth daily. 90 tablet 3  . Misc Natural Products (COSAMIN ASU ADVANCED FORMULA PO) Take by mouth 2 (two) times daily.    . Multiple Vitamin (MULTIVITAMIN) tablet Take 1  tablet by mouth daily.    . Omega-3 Fatty Acids (FISH OIL) 1000 MG CAPS Take by mouth daily.    . Probiotic Product (PROBIOTIC PEARLS PO) Take 1 tablet by mouth daily.    . simvastatin (ZOCOR) 40 MG tablet Take 1 tablet (40 mg total) by mouth at bedtime. 90 tablet 3  . Turmeric 500 MG CAPS Take 500 mg by mouth daily.    . valACYclovir (VALTREX) 1000 MG tablet Take 1,000 mg by mouth. 1/2 by mouth once daily     No current facility-administered medications on file prior to visit.     Past Medical History:  Diagnosis Date  . Allergy    RHINITIS  . Anxiety 1989   Dr Jake Michaelis, [place]  . Depression    Dr Toy Care  . Fibroids   . GERD 11/11/2009   Qualifier: Diagnosis of  By: Linna Darner MD, Gwyndolyn Saxon   Dysphagia once monthly on average 4-10/14 ; resolved with Prilosec OTC prn No PMH of endoscopy    . GERD (gastroesophageal reflux disease)   . Hyperlipidemia   . Hypertension     Past Surgical History:  Procedure Laterality Date  . ABDOMINAL HYSTERECTOMY     & USO  . ANAL FISTULA ECTOMY      X 2,  Dr Rosana Hoes  . COLONOSCOPY  2004, 2015   Carlean Purl  . INJECTION KNEE Bilateral    [facility]  . MYOMECTOMY     FOR FIBROIDS, Dr Ubaldo Glassing  . steroid injection shoulder Right 05/11/2013   contusion/strain; Dr Onnie Graham    Social History   Socioeconomic History  . Marital status: Divorced    Spouse name: Not on file  . Number of children: Not on file  . Years of education: Not on file  . Highest education level: Not on file  Occupational History  . Occupation: Press photographer  Social Needs  . Financial resource strain: Not hard at all  . Food insecurity    Worry: Never true    Inability: Never true  . Transportation needs    Medical: No    Non-medical: No  Tobacco Use  . Smoking status: Never Smoker  . Smokeless tobacco: Never Used  Substance and Sexual Activity  . Alcohol use: Yes    Alcohol/week: 7.0 - 10.0 standard drinks    Types: 7 - 10 Glasses of wine per week    Comment: 1-2 galsses of wine daily  .  Drug use: No  . Sexual activity: Not Currently  Lifestyle  . Physical activity    Days per week: 5 days    Minutes per session: 50 min  . Stress: Only a little  Relationships  . Social connections    Talks on phone: More than three times a week    Gets together: More than three times a week    Attends religious service: 1 to 4 times per year    Active member of club or organization: Yes    Attends meetings of clubs or organizations: More than 4 times per year    Relationship status: Not on file  Other Topics Concern  . Not on file  Social History Narrative   GETS REG EXERCISE          Family History  Problem Relation Age of Onset  . Arthritis Mother   . Stroke Mother 11  . Alcohol abuse Father   . Hyperlipidemia Sister   . Hypertension Sister   . Atrial fibrillation Sister   . Breast cancer Paternal Aunt   . Diabetes Maternal Grandmother   . Heart disease Paternal Grandmother        CAD; no MI  . Stroke Paternal Grandmother        in 41s  . Stroke Paternal Grandfather        in late 75s  . Colon cancer Neg Hx     Review of Systems  Constitutional: Negative for chills and fever.  HENT: Positive for rhinorrhea.   Eyes: Negative for visual disturbance.  Respiratory: Negative for cough, shortness of breath and wheezing.   Cardiovascular: Negative for chest pain, palpitations and leg swelling.  Gastrointestinal: Negative for abdominal pain, blood in stool, constipation, diarrhea and nausea.       Rare gerd  Genitourinary: Negative for dysuria and hematuria.  Musculoskeletal: Positive for arthralgias and back pain.  Skin: Negative for color change and rash.  Neurological: Positive for headaches (occasional). Negative for dizziness and light-headedness.  Psychiatric/Behavioral: Positive for dysphoric mood. The patient is nervous/anxious.        Objective:   Vitals:   02/20/19 1334  BP: 130/82  Pulse: 67  Resp: 16  Temp: 98.3 F (36.8 C)  SpO2: 97%   Filed  Weights   02/20/19 1334  Weight: 188 lb (85.3 kg)  Body mass index is 29.44 kg/m.  BP Readings from Last 3 Encounters:  02/20/19 130/82  07/18/18 (!) 146/90  01/25/18 (!) 142/90    Wt Readings from Last 3 Encounters:  02/20/19 188 lb (85.3 kg)  01/25/18 186 lb 1.9 oz (84.4 kg)  11/01/17 180 lb (81.6 kg)     Physical Exam Constitutional: She appears well-developed and well-nourished. No distress.  HENT:  Head: Normocephalic and atraumatic.  Right Ear: External ear normal. Normal ear canal and TM Left Ear: External ear normal.  Normal ear canal and TM Mouth/Throat: Oropharynx is clear and moist.  Eyes: Conjunctivae and EOM are normal.  Neck: Neck supple. No tracheal deviation present. No thyromegaly present.  No carotid bruit  Cardiovascular: Normal rate, regular rhythm and normal heart sounds.   No murmur heard.  No edema. Pulmonary/Chest: Effort normal and breath sounds normal. No respiratory distress. She has no wheezes. She has no rales.  Breast: deferred   Abdominal: Soft. She exhibits no distension. There is no tenderness.  Lymphadenopathy: She has no cervical adenopathy.  Skin: Skin is warm and dry. She is not diaphoretic.  Psychiatric: She has a normal mood and affect. Her behavior is normal.        Assessment & Plan:   Physical exam: Screening blood work    ordered Immunizations  penumovax today, shingrix discussed Colonoscopy     Up to date  Mammogram  Up to date  Gyn  Up to date  - 12/2018 Dexa  Up to date  Eye exams  Up to date  Exercise  Rides horses on weekend, None right now Weight  Working on weight loss Substance abuse    none  Sees derm twice a year  See Problem List for Assessment and Plan of chronic medical problems.   FU in one year

## 2019-02-20 ENCOUNTER — Ambulatory Visit (INDEPENDENT_AMBULATORY_CARE_PROVIDER_SITE_OTHER): Payer: PPO | Admitting: Internal Medicine

## 2019-02-20 ENCOUNTER — Other Ambulatory Visit (INDEPENDENT_AMBULATORY_CARE_PROVIDER_SITE_OTHER): Payer: PPO

## 2019-02-20 ENCOUNTER — Encounter: Payer: Self-pay | Admitting: Internal Medicine

## 2019-02-20 ENCOUNTER — Other Ambulatory Visit: Payer: Self-pay

## 2019-02-20 VITALS — BP 130/82 | HR 67 | Temp 98.3°F | Resp 16 | Ht 67.0 in | Wt 188.0 lb

## 2019-02-20 DIAGNOSIS — R7301 Impaired fasting glucose: Secondary | ICD-10-CM

## 2019-02-20 DIAGNOSIS — Z23 Encounter for immunization: Secondary | ICD-10-CM | POA: Diagnosis not present

## 2019-02-20 DIAGNOSIS — Z85820 Personal history of malignant melanoma of skin: Secondary | ICD-10-CM | POA: Diagnosis not present

## 2019-02-20 DIAGNOSIS — M150 Primary generalized (osteo)arthritis: Secondary | ICD-10-CM

## 2019-02-20 DIAGNOSIS — E782 Mixed hyperlipidemia: Secondary | ICD-10-CM | POA: Diagnosis not present

## 2019-02-20 DIAGNOSIS — M1990 Unspecified osteoarthritis, unspecified site: Secondary | ICD-10-CM | POA: Insufficient documentation

## 2019-02-20 DIAGNOSIS — M159 Polyosteoarthritis, unspecified: Secondary | ICD-10-CM

## 2019-02-20 DIAGNOSIS — Z85828 Personal history of other malignant neoplasm of skin: Secondary | ICD-10-CM | POA: Insufficient documentation

## 2019-02-20 DIAGNOSIS — Z0000 Encounter for general adult medical examination without abnormal findings: Secondary | ICD-10-CM

## 2019-02-20 DIAGNOSIS — I10 Essential (primary) hypertension: Secondary | ICD-10-CM

## 2019-02-20 DIAGNOSIS — R768 Other specified abnormal immunological findings in serum: Secondary | ICD-10-CM

## 2019-02-20 DIAGNOSIS — M8949 Other hypertrophic osteoarthropathy, multiple sites: Secondary | ICD-10-CM | POA: Diagnosis not present

## 2019-02-20 LAB — LIPID PANEL
Cholesterol: 156 mg/dL (ref 0–200)
HDL: 50.6 mg/dL (ref >39.00)
LDL Cholesterol: 81 mg/dL (ref 0–99)
NonHDL: 105.71
Total CHOL/HDL Ratio: 3
Triglycerides: 125 mg/dL (ref 0.0–149.0)
VLDL: 25 mg/dL (ref 0.0–40.0)

## 2019-02-20 LAB — CBC WITH DIFFERENTIAL/PLATELET
Basophils Absolute: 0.1 10*3/uL (ref 0.0–0.1)
Basophils Relative: 1 % (ref 0.0–3.0)
Eosinophils Absolute: 0.3 10*3/uL (ref 0.0–0.7)
Eosinophils Relative: 3.4 % (ref 0.0–5.0)
HCT: 39.3 % (ref 36.0–46.0)
Hemoglobin: 13.4 g/dL (ref 12.0–15.0)
Lymphocytes Relative: 30.2 % (ref 12.0–46.0)
Lymphs Abs: 2.5 10*3/uL (ref 0.7–4.0)
MCHC: 34 g/dL (ref 30.0–36.0)
MCV: 93.3 fl (ref 78.0–100.0)
Monocytes Absolute: 0.7 10*3/uL (ref 0.1–1.0)
Monocytes Relative: 8.5 % (ref 3.0–12.0)
Neutro Abs: 4.7 10*3/uL (ref 1.4–7.7)
Neutrophils Relative %: 56.9 % (ref 43.0–77.0)
Platelets: 214 10*3/uL (ref 150.0–400.0)
RBC: 4.22 Mil/uL (ref 3.87–5.11)
RDW: 13.3 % (ref 11.5–15.5)
WBC: 8.2 10*3/uL (ref 4.0–10.5)

## 2019-02-20 LAB — COMPREHENSIVE METABOLIC PANEL
ALT: 28 U/L (ref 0–35)
AST: 28 U/L (ref 0–37)
Albumin: 4 g/dL (ref 3.5–5.2)
Alkaline Phosphatase: 70 U/L (ref 39–117)
BUN: 23 mg/dL (ref 6–23)
CO2: 26 mEq/L (ref 19–32)
Calcium: 9.5 mg/dL (ref 8.4–10.5)
Chloride: 102 mEq/L (ref 96–112)
Creatinine, Ser: 0.61 mg/dL (ref 0.40–1.20)
GFR: 97.24 mL/min (ref >60.00)
Glucose, Bld: 95 mg/dL (ref 70–99)
Potassium: 3.9 mEq/L (ref 3.5–5.1)
Sodium: 136 mEq/L (ref 135–145)
Total Bilirubin: 0.4 mg/dL (ref 0.2–1.2)
Total Protein: 7.1 g/dL (ref 6.0–8.3)

## 2019-02-20 LAB — TSH: TSH: 1.58 u[IU]/mL (ref 0.35–4.50)

## 2019-02-20 LAB — HEMOGLOBIN A1C: Hgb A1c MFr Bld: 5.6 % (ref 4.6–6.5)

## 2019-02-20 MED ORDER — MELOXICAM 7.5 MG PO TABS: 7.5000 mg | ORAL_TABLET | Freq: Two times a day (BID) | ORAL | 3 refills | Status: DC

## 2019-02-20 MED ORDER — SIMVASTATIN 40 MG PO TABS: 40.0000 mg | ORAL_TABLET | Freq: Every day | ORAL | 3 refills | Status: DC

## 2019-02-20 NOTE — Assessment & Plan Note (Signed)
Taking advil daily Will change to meloxicam 7.5 mg twice daily - take with food, no other nsaids Discussed possible risks, but we both feels benefits outweigh the risks

## 2019-02-20 NOTE — Assessment & Plan Note (Signed)
Following derm Q 6 months

## 2019-02-20 NOTE — Assessment & Plan Note (Signed)
Check a1c Low sugar / carb diet Stressed regular exercise   

## 2019-02-20 NOTE — Assessment & Plan Note (Signed)
BP well controlled Current regimen effective and well tolerated Continue current medications at current doses cmp  

## 2019-02-20 NOTE — Assessment & Plan Note (Signed)
Check lipid panel,cmp ,tsh Continue daily statin Regular exercise and healthy diet encouraged  

## 2019-02-20 NOTE — Assessment & Plan Note (Signed)
No virus detected in past few years Likely cleared infection

## 2019-02-23 ENCOUNTER — Encounter: Payer: Self-pay | Admitting: Internal Medicine

## 2019-04-16 DIAGNOSIS — D225 Melanocytic nevi of trunk: Secondary | ICD-10-CM | POA: Diagnosis not present

## 2019-04-16 DIAGNOSIS — Z86006 Personal history of melanoma in-situ: Secondary | ICD-10-CM | POA: Diagnosis not present

## 2019-04-16 DIAGNOSIS — L578 Other skin changes due to chronic exposure to nonionizing radiation: Secondary | ICD-10-CM | POA: Diagnosis not present

## 2019-04-16 DIAGNOSIS — L814 Other melanin hyperpigmentation: Secondary | ICD-10-CM | POA: Diagnosis not present

## 2019-04-16 DIAGNOSIS — D2371 Other benign neoplasm of skin of right lower limb, including hip: Secondary | ICD-10-CM | POA: Diagnosis not present

## 2019-04-16 DIAGNOSIS — D2221 Melanocytic nevi of right ear and external auricular canal: Secondary | ICD-10-CM | POA: Diagnosis not present

## 2019-04-16 DIAGNOSIS — Z23 Encounter for immunization: Secondary | ICD-10-CM | POA: Diagnosis not present

## 2019-04-16 DIAGNOSIS — L821 Other seborrheic keratosis: Secondary | ICD-10-CM | POA: Diagnosis not present

## 2019-05-13 ENCOUNTER — Ambulatory Visit: Payer: PPO | Attending: Internal Medicine

## 2019-05-13 DIAGNOSIS — Z23 Encounter for immunization: Secondary | ICD-10-CM | POA: Insufficient documentation

## 2019-05-13 NOTE — Progress Notes (Signed)
   Covid-19 Vaccination Clinic  Name:  Jasmine Bray    MRN: [id number] DOB: 1950/12/04  05/13/2019  Ms. Jasmine Bray was observed post Covid-19 immunization for 15 minutes without incidence. She was provided with Vaccine Information Sheet and instruction to access the V-Safe system.   Ms. Jasmine Bray was instructed to call 911 with any severe reactions post vaccine: Marland Kitchen Difficulty breathing  . Swelling of your face and throat  . A fast heartbeat  . A bad rash all over your body  . Dizziness and weakness    Immunizations Administered    Name Date Dose VIS Date Route   Pfizer COVID-19 Vaccine 05/13/2019  1:48 PM 0.3 mL 03/09/2019 Intramuscular   Manufacturer: [place]   Lot: [id number]   [place]: [id number]

## 2019-06-05 ENCOUNTER — Ambulatory Visit: Payer: PPO | Attending: Internal Medicine

## 2019-06-05 DIAGNOSIS — Z23 Encounter for immunization: Secondary | ICD-10-CM

## 2019-06-05 NOTE — Progress Notes (Signed)
   Covid-19 Vaccination Clinic  Name:  Jasmine Bray    MRN: [id number] DOB: Apr 07, 1950  06/05/2019  [name] was observed post Covid-19 immunization for 15 minutes without incident. She was provided with Vaccine Information Sheet and instruction to access the V-Safe system.   [name] was instructed to call 911 with any severe reactions post vaccine: Marland Kitchen Difficulty breathing  . Swelling of face and throat  . A fast heartbeat  . A bad rash all over body  . Dizziness and weakness   Immunizations Administered    Name Date Dose VIS Date Route   Pfizer COVID-19 Vaccine 06/05/2019  2:58 PM 0.3 mL 03/09/2019 Intramuscular   Manufacturer: [place]   Lot: [id number]   [place]: [id number]

## 2019-06-06 ENCOUNTER — Ambulatory Visit: Payer: PPO

## 2019-07-30 ENCOUNTER — Ambulatory Visit: Payer: Self-pay

## 2019-07-30 ENCOUNTER — Other Ambulatory Visit: Payer: Self-pay

## 2019-07-30 ENCOUNTER — Ambulatory Visit: Payer: PPO | Admitting: Family Medicine

## 2019-07-30 ENCOUNTER — Encounter: Payer: Self-pay | Admitting: Family Medicine

## 2019-07-30 VITALS — Ht 68.0 in

## 2019-07-30 DIAGNOSIS — M5432 Sciatica, left side: Secondary | ICD-10-CM

## 2019-07-30 DIAGNOSIS — M25511 Pain in right shoulder: Secondary | ICD-10-CM

## 2019-07-30 MED ORDER — PREDNISONE 5 MG PO TABS
ORAL_TABLET | ORAL | 0 refills | Status: DC
Start: 2019-07-30 — End: 2019-09-06

## 2019-07-30 MED ORDER — GABAPENTIN 100 MG PO CAPS: 100.0000 mg | ORAL_CAPSULE | Freq: Three times a day (TID) | ORAL | 3 refills | Status: DC

## 2019-07-30 NOTE — Patient Instructions (Signed)
Good to see you Please try ice as needed  Please try the exercises  Please try the pennsaid after the prednisone.   Please send me a message in MyChart with any questions or updates.  Please see me back in 4 weeks.   --Dr. Raeford Razor

## 2019-07-30 NOTE — Progress Notes (Signed)
Jasmine Bray - 68 y.o. female MRN [id number]  Date of birth: 05-18-1950  SUBJECTIVE:  Including CC & ROS.  Chief Complaint  Patient presents with  . Arm Pain    right arm    Jasmine Bray is a 69 y.o. female that is presenting with right shoulder pain.  This been ongoing for 2 months.  Denies any numbness or tingling.  She was lifting a tailgate up with her shoulder and has had pain since that time.  No ecchymosis or bruising.  Seems to be worse with doing dips or doing weights above her head.  Has a history of low back pain that gets improvement with gabapentin.   Review of Systems See HPI   HISTORY: Past Medical, Surgical, Social, and Family History Reviewed & Updated per EMR.   Pertinent Historical Findings include:  Past Medical History:  Diagnosis Date  . Allergy    RHINITIS  . Anxiety 1989   Dr Jake Michaelis, [place]  . Depression    Dr Toy Care  . Fibroids   . GERD 11/11/2009   Qualifier: Diagnosis of  By: Linna Darner MD, Gwyndolyn Saxon   Dysphagia once monthly on average 4-10/14 ; resolved with Prilosec OTC prn No PMH of endoscopy    . GERD (gastroesophageal reflux disease)   . Hyperlipidemia   . Hypertension     Past Surgical History:  Procedure Laterality Date  . ABDOMINAL HYSTERECTOMY     & USO  . ANAL FISTULA ECTOMY      X 2, Dr Rosana Hoes  . COLONOSCOPY  2004, 2015   Carlean Purl  . INJECTION KNEE Bilateral    [facility]  . MYOMECTOMY     FOR FIBROIDS, Dr Ubaldo Glassing  . steroid injection shoulder Right 05/11/2013   contusion/strain; Dr Onnie Graham    Family History  Problem Relation Age of Onset  . Arthritis Mother   . Stroke Mother 28  . Alcohol abuse Father   . Hyperlipidemia Sister   . Hypertension Sister   . Atrial fibrillation Sister   . Breast cancer Paternal Aunt   . Diabetes Maternal Grandmother   . Heart disease Paternal Grandmother        CAD; no MI  . Stroke Paternal Grandmother        in 64s  . Stroke Paternal Grandfather        in late 69s  . Colon cancer Neg Hx      Social History   Socioeconomic History  . Marital status: Divorced    Spouse name: Not on file  . Number of children: Not on file  . Years of education: Not on file  . Highest education level: Not on file  Occupational History  . Occupation: Press photographer  Tobacco Use  . Smoking status: Never Smoker  . Smokeless tobacco: Never Used  Substance and Sexual Activity  . Alcohol use: Yes    Alcohol/week: 7.0 - 10.0 standard drinks    Types: 7 - 10 Glasses of wine per week    Comment: 1-2 galsses of wine daily  . Drug use: No  . Sexual activity: Not Currently  Other Topics Concern  . Not on file  Social History Narrative   GETS REG EXERCISE         Social Determinants of Health   Financial Resource Strain:   . Difficulty of Paying Living Expenses:   Food Insecurity:   . Worried About Charity fundraiser in the Last Year:   . Arboriculturist in  the Last Year:   Transportation Needs:   . Film/video editor (Medical):   Marland Kitchen Lack of Transportation (Non-Medical):   Physical Activity: Sufficiently Active  . Days of Exercise per Week: 5 days  . Minutes of Exercise per Session: 50 min  Stress:   . Feeling of Stress :   Social Connections: Unknown  . Frequency of Communication with Friends and Family: Not on file  . Frequency of Social Gatherings with Friends and Family: Not on file  . Attends Religious Services: 1 to 4 times per year  . Active Member of Clubs or Organizations: Yes  . Attends Archivist Meetings: More than 4 times per year  . Marital Status: Not on file  Intimate Partner Violence:   . Fear of Current or Ex-Partner:   . Emotionally Abused:   Marland Kitchen Physically Abused:   . Sexually Abused:      PHYSICAL EXAM:  VS: Ht 5\' 8"  (1.727 m)   BMI 28.59 kg/m  Physical Exam Gen: NAD, alert, cooperative with exam, well-appearing MSK:  Right shoulder: Tenderness to palpation over the posterior humerus, mainly over the lateral head of the tricep. Normal  internal and external rotation. Normal strength resistance. No ecchymosis or swelling. Normal empty can test. Normal speeds test. Normal O'Brien's test. Neurovascularly intact  Limited ultrasound: Right shoulder:  Biceps tendon is intact.  Has some thickening to suggest chronic tendinosis. Subscapularis has calcific change from likely a distant injury. Supraspinatus with mild chronic changes. Normal posterior glenohumeral joint. Midportion of the humerus had increased hyperemia in the subcutaneous with no specific structural changes.  Summary: Different chronic changes but appears to be acutely inflammatory and subcutaneous in the midportion of the upper arm.  Ultrasound and interpretation by Clearance Coots, MD   ASSESSMENT & PLAN:   Sciatica of left side Has a history of low back and sciatic type symptoms.  Gets improvement with gabapentin. -Counseled on home exercise therapy and supportive care. -Gabapentin.   Acute pain of right shoulder Pain has been occurring since lifting up a tailgate.  She does have some hyperemia in the subcutaneous at the point of maximum tenderness.  Has chronic changes in the rotator cuff.  May have exacerbation of these chronic changes with recent inciting event. -Prednisone. -Provided samples of Pennsaid. -Counseled on home exercise therapy and supportive care. -Could consider injection if needed.

## 2019-07-30 NOTE — Assessment & Plan Note (Signed)
Has a history of low back and sciatic type symptoms.  Gets improvement with gabapentin. -Counseled on home exercise therapy and supportive care. -Gabapentin.

## 2019-07-30 NOTE — Progress Notes (Signed)
Medication Samples have been provided to the patient.  Drug name: Pennsaid       Strength: 2%        Qty: 2 Boxes  LOT: [id number]  Exp.Date: 03/2020  Dosing instructions: Use a pea size amount and rub gently.  The patient has been instructed regarding the correct time, dose, and frequency of taking this medication, including desired effects and most common side effects.   Sherrie George, [place] 4:13 PM 07/30/2019

## 2019-07-30 NOTE — Assessment & Plan Note (Signed)
Pain has been occurring since lifting up a tailgate.  She does have some hyperemia in the subcutaneous at the point of maximum tenderness.  Has chronic changes in the rotator cuff.  May have exacerbation of these chronic changes with recent inciting event. -Prednisone. -Provided samples of Pennsaid. -Counseled on home exercise therapy and supportive care. -Could consider injection if needed.

## 2019-08-28 ENCOUNTER — Ambulatory Visit: Payer: PPO | Admitting: Family Medicine

## 2019-08-31 ENCOUNTER — Ambulatory Visit: Payer: PPO | Admitting: Internal Medicine

## 2019-09-03 NOTE — Progress Notes (Deleted)
Subjective:    Patient ID: Jasmine Bray, female    DOB: 1950/11/23, 69 y.o.   MRN: [id number]  HPI The patient is here for an acute visit.   Depression:    Medications and allergies reviewed with patient and updated if appropriate.  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     aspirin EC 81 MG tablet Take 81 mg by mouth daily.     Biotin 1 MG CAPS Take 1 tablet by mouth daily.     buPROPion (WELLBUTRIN XL) 150 MG 24 hr tablet Take 150 mg by mouth daily.     Cholecalciferol (VITAMIN D3) 3000 UNITS TABS Take 1 capsule by mouth daily. Taking 1000 units daily     escitalopram (LEXAPRO) 20 MG tablet Take 10 mg by mouth daily.      estradiol (ESTRACE) 0.1 MG/GM vaginal cream Place 2 g vaginally. 1 GRAM 3x A WEEK      estradiol (EVAMIST) 1.53 MG/SPRAY transdermal spray Evamist 1.53 mg/spray (1.7 %) transdermal spray  APPLY 1 SPRAY DAILY AS DIRECTED     gabapentin (NEURONTIN) 100 MG capsule Take 1 capsule (100 mg total) by mouth 3 (three) times daily. 90 capsule 3   Garlic 2703 MG CAPS Take 1 capsule by mouth daily.     hydrochlorothiazide (HYDRODIURIL) 25 MG tablet Take 1 tablet (25 mg total) by mouth daily. 90 tablet 3   meloxicam (MOBIC) 7.5 MG tablet Take 1 tablet (7.5 mg total) by mouth 2 (two) times daily. Take with food. 180 tablet 3   Misc Natural  Products (COSAMIN ASU ADVANCED FORMULA PO) Take by mouth 2 (two) times daily.     Multiple Vitamin (MULTIVITAMIN) tablet Take 1 tablet by mouth daily.     Omega-3 Fatty Acids (FISH OIL) 1000 MG CAPS Take by mouth daily.     predniSONE (DELTASONE) 5 MG tablet Take 6 pills for first day, 5 pills second day, 4 pills third day, 3 pills fourth day, 2 pills the fifth day, and 1 pill sixth day. 21 tablet 0   Probiotic Product (PROBIOTIC PEARLS PO) Take 1 tablet by mouth daily.     simvastatin (ZOCOR) 40 MG tablet Take 1 tablet (40 mg total) by mouth at bedtime. 90 tablet 3   Turmeric 500 MG CAPS Take 500 mg by mouth daily.     valACYclovir (VALTREX) 1000 MG tablet Take 1,000 mg by mouth. 1/2 by mouth once daily     No current facility-administered medications on file prior to visit.    Past Medical History:  Diagnosis Date   Allergy    RHINITIS   Anxiety 1989   Dr Jake Michaelis, [facility]   Depression    Dr Toy Care   Fibroids    GERD 11/11/2009   Qualifier: Diagnosis of  By: Linna Darner MD, Gwyndolyn Saxon   Dysphagia once monthly on average 4-10/14 ;  resolved with Prilosec OTC prn No PMH of endoscopy     GERD (gastroesophageal reflux disease)    Hyperlipidemia    Hypertension     Past Surgical History:  Procedure Laterality Date   ABDOMINAL HYSTERECTOMY     & USO   ANAL FISTULA ECTOMY      X 2, Dr Rosana Hoes   COLONOSCOPY  2004, 2015   [place] Bilateral    [facility]   MYOMECTOMY     FOR FIBROIDS, Dr Ubaldo Glassing   steroid injection shoulder Right 05/11/2013   contusion/strain; Dr Onnie Graham    Social History   Socioeconomic History   Marital status: Divorced    Spouse name: Not on file   Number of children: Not on file   Years of education: Not on file   Highest education level: Not on file  Occupational History   Occupation: SALES  Tobacco Use   Smoking status: Never Smoker   Smokeless tobacco: Never Used  Substance and Sexual Activity   Alcohol use: Yes     Alcohol/week: 7.0 - 10.0 standard drinks    Types: 7 - 10 Glasses of wine per week    Comment: 1-2 galsses of wine daily   Drug use: No   Sexual activity: Not Currently  Other Topics Concern   Not on file  Social History Narrative   GETS REG EXERCISE         Social Determinants of Health   Financial Resource Strain:    Difficulty of Paying Living Expenses:   Food Insecurity:    Worried About Charity fundraiser in the Last Year:    Arboriculturist in the Last Year:   Transportation Needs:    Film/video editor (Medical):    Lack of Transportation (Non-Medical):   Physical Activity: Sufficiently Active   Days of Exercise per Week: 5 days   Minutes of Exercise per Session: 50 min  Stress:    Feeling of Stress :   Social Connections: Unknown   Frequency of Communication with Friends and Family: Not on file   Frequency of Social Gatherings with Friends and Family: Not on file   Attends Religious Services: 1 to 4 times per year   Active Member of [employer] or Organizations: Yes   Attends Music therapist: More than 4 times per year   Marital Status: Not on file    Family History  Problem Relation Age of Onset   Arthritis Mother    Stroke Mother 67   Alcohol abuse Father    Hyperlipidemia Sister    Hypertension Sister    Atrial fibrillation Sister    Breast cancer Paternal Aunt    Diabetes Maternal Grandmother    Heart disease Paternal Grandmother        CAD; no MI   Stroke Paternal Grandmother        in 77s   Stroke Paternal Grandfather        in late 30s   Colon cancer Neg Hx     Review of Systems     Objective:  There were no vitals filed for this visit. BP Readings from Last 3 Encounters:  02/20/19 130/82  07/18/18 (!) 146/90  01/25/18 (!) 142/90   Wt Readings from Last 3 Encounters:  02/20/19 188 lb (85.3 kg)  01/25/18 186 lb 1.9 oz (84.4 kg)  11/01/17 180 lb (81.6 kg)   There is no height or weight on file  to calculate BMI.  Physical Exam         Assessment & Plan:    See Problem List for Assessment and Plan of chronic medical problems.     This visit occurred during the SARS-CoV-2 public health emergency.  Safety protocols were in place, including screening questions prior to the visit, additional usage of staff PPE, and extensive cleaning of exam room while observing appropriate contact time as indicated for disinfecting solutions.

## 2019-09-04 ENCOUNTER — Ambulatory Visit: Payer: PPO | Admitting: Internal Medicine

## 2019-09-05 NOTE — Progress Notes (Signed)
Subjective:    Patient ID: Jasmine Bray, female    DOB: [birth year], 69 y.o.   MRN: [id number]  HPI The patient is here for an acute visit.   Depression:  She feels the antidepressants are making her tired.  She just wants to sleep.  She does not feel depressed.  She follows with Dr Toy Care, but does not see her for a couple of months.  She has been on lexapro for years and wanted to try to taper off of it, but wanted to discuss it with someone.    She started with a dry hacking cough 4 days ago.  She had a headache, wheeze, chest tightness.  She was concerned she had a chest infection and wanted her lungs listened to.      Medications and allergies reviewed with patient and updated if appropriate.  Patient Active Problem List   Diagnosis Date Noted  . History of melanoma 02/20/2019  . Osteoarthritis 02/20/2019  . Primary osteoarthritis of right knee 07/18/2018  . Sciatica of left side 10/10/2017  . Chronic knee pain 07/25/2017  . Left upper arm pain 03/17/2016  . Hepatitis C antibody test positive 01/29/2016  . Acute pain of right shoulder 04/24/2013  . Fasting hyperglycemia 01/10/2012  . Hyperlipidemia 11/11/2009  . 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 Essential hypertension 11/11/2009    Current Outpatient Medications on File Prior to Visit  Medication Sig Dispense Refill  . ALPRAZolam (XANAX) 1 MG tablet Take 1 mg by mouth as needed.    Marland Kitchen aspirin EC 81 MG tablet Take 81 mg by mouth daily.    . Biotin 1 MG CAPS Take 1 tablet by mouth daily.    Marland Kitchen buPROPion (WELLBUTRIN XL) 150 MG 24 hr tablet Take 150 mg by mouth daily.    . Cholecalciferol (VITAMIN D3) 3000 UNITS TABS Take 1 capsule by mouth daily. Taking 1000 units daily    . escitalopram (LEXAPRO) 20 MG tablet Take 10 mg by mouth daily.     Marland Kitchen estradiol (ESTRACE) 0.1 MG/GM vaginal cream Place 2 g vaginally. 1 GRAM 3x A WEEK     . estradiol (EVAMIST) 1.53 MG/SPRAY transdermal spray Evamist 1.53 mg/spray (1.7  %) transdermal spray  APPLY 1 SPRAY DAILY AS DIRECTED    . gabapentin (NEURONTIN) 100 MG capsule Take 1 capsule (100 mg total) by mouth 3 (three) times daily. 90 capsule 3  . Garlic 4098 MG CAPS Take 1 capsule by mouth daily.    . hydrochlorothiazide (HYDRODIURIL) 25 MG tablet Take 1 tablet (25 mg total) by mouth daily. 90 tablet 3  . meloxicam (MOBIC) 7.5 MG tablet Take 1 tablet (7.5 mg total) by mouth 2 (two) times daily. Take with food. 180 tablet 3  . Misc Natural Products (COSAMIN ASU ADVANCED FORMULA PO) Take by mouth 2 (two) times daily.    . Multiple Vitamin (MULTIVITAMIN) tablet Take 1 tablet by mouth daily.    . Omega-3 Fatty Acids (FISH OIL) 1000 MG CAPS Take by mouth daily.    . Probiotic Product (PROBIOTIC PEARLS PO) Take 1 tablet by mouth daily.    . simvastatin (ZOCOR) 40 MG tablet Take 1 tablet (40 mg total) by mouth at bedtime. 90 tablet 3  . Turmeric 500 MG CAPS Take 500 mg by mouth daily.    . valACYclovir (VALTREX) 1000 MG tablet Take 1,000 mg by mouth. 1/2 by mouth once daily     No current facility-administered medications on file prior to  visit.    Past Medical History:  Diagnosis Date  . Allergy    RHINITIS  . Anxiety 1989   Dr Jake Michaelis, [place]  . Depression    Dr Toy Care  . Fibroids   . GERD 11/11/2009   Qualifier: Diagnosis of  By: Linna Darner MD, Gwyndolyn Saxon   Dysphagia once monthly on average 4-10/14 ; resolved with Prilosec OTC prn No PMH of endoscopy    . GERD (gastroesophageal reflux disease)   . Hyperlipidemia   . Hypertension     Past Surgical History:  Procedure Laterality Date  . ABDOMINAL HYSTERECTOMY     & USO  . ANAL FISTULA ECTOMY      X 2, Dr Rosana Hoes  . COLONOSCOPY  2004, 2015   Carlean Purl  . INJECTION KNEE Bilateral    [facility]  . MYOMECTOMY     FOR FIBROIDS, Dr Ubaldo Glassing  . steroid injection shoulder Right 05/11/2013   contusion/strain; Dr Onnie Graham    Social History   Socioeconomic History  . Marital status: Divorced    Spouse name: Not on file  .  Number of children: Not on file  . Years of education: Not on file  . Highest education level: Not on file  Occupational History  . Occupation: Press photographer  Tobacco Use  . Smoking status: Never Smoker  . Smokeless tobacco: Never Used  Vaping Use  . Vaping Use: Never used  Substance and Sexual Activity  . Alcohol use: Yes    Alcohol/week: 7.0 - 10.0 standard drinks    Types: 7 - 10 Glasses of wine per week    Comment: 1-2 galsses of wine daily  . Drug use: No  . Sexual activity: Not Currently  Other Topics Concern  . Not on file  Social History Narrative   GETS REG EXERCISE         Social Determinants of Health   Financial Resource Strain:   . Difficulty of Paying Living Expenses:   Food Insecurity:   . Worried About Charity fundraiser in the Last Year:   . Arboriculturist in the Last Year:   Transportation Needs:   . Film/video editor (Medical):   Marland Kitchen Lack of Transportation (Non-Medical):   Physical Activity: Sufficiently Active  . Days of Exercise per Week: 5 days  . Minutes of Exercise per Session: 50 min  Stress:   . Feeling of Stress :   Social Connections: Unknown  . Frequency of Communication with Friends and Family: Not on file  . Frequency of Social Gatherings with Friends and Family: Not on file  . Attends Religious Services: 1 to 4 times per year  . Active Member of Clubs or Organizations: Yes  . Attends Archivist Meetings: More than 4 times per year  . Marital Status: Not on file    Family History  Problem Relation Age of Onset  . Arthritis Mother   . Stroke Mother 36  . Alcohol abuse Father   . Hyperlipidemia Sister   . Hypertension Sister   . Atrial fibrillation Sister   . Breast cancer Paternal Aunt   . Diabetes Maternal Grandmother   . Heart disease Paternal Grandmother        CAD; no MI  . Stroke Paternal Grandmother        in 28s  . Stroke Paternal Grandfather        in late 4s  . Colon cancer Neg Hx     Review of Systems  Constitutional: Positive for fatigue. Negative for fever.  HENT: Positive for ear pain (occ R). Negative for congestion, postnasal drip, sinus pressure and sore throat.   Respiratory: Positive for cough (dry), chest tightness, shortness of breath (mild) and wheezing.   Neurological: Positive for headaches.       Objective:   Vitals:   09/06/19 1055  BP: 134/84  Pulse: 62  Temp: 98.5 F (36.9 C)  SpO2: 95%   BP Readings from Last 3 Encounters:  09/06/19 134/84  02/20/19 130/82  07/18/18 (!) 146/90   Wt Readings from Last 3 Encounters:  09/06/19 186 lb (84.4 kg)  02/20/19 188 lb (85.3 kg)  01/25/18 186 lb 1.9 oz (84.4 kg)   Body mass index is 28.28 kg/m.   Physical Exam    GENERAL APPEARANCE: Appears stated age, well appearing, NAD EYES: conjunctiva clear, no icterus HEENT: bilateral tympanic membranes and ear canals normal, oropharynx with no erythema, no thyromegaly, trachea midline, no cervical or supraclavicular lymphadenopathy LUNGS: Clear to auscultation without wheeze or crackles, unlabored breathing, good air entry bilaterally CARDIOVASCULAR: Normal S1,S2 without murmurs, no edema SKIN: Warm, dry      Assessment & Plan:    See Problem List for Assessment and Plan of chronic medical problems.     This visit occurred during the SARS-CoV-2 public health emergency.  Safety protocols were in place, including screening questions prior to the visit, additional usage of staff PPE, and extensive cleaning of exam room while observing appropriate contact time as indicated for disinfecting solutions.

## 2019-09-06 ENCOUNTER — Ambulatory Visit (INDEPENDENT_AMBULATORY_CARE_PROVIDER_SITE_OTHER): Payer: PPO | Admitting: Internal Medicine

## 2019-09-06 ENCOUNTER — Other Ambulatory Visit: Payer: Self-pay

## 2019-09-06 ENCOUNTER — Encounter: Payer: Self-pay | Admitting: Internal Medicine

## 2019-09-06 DIAGNOSIS — J069 Acute upper respiratory infection, unspecified: Secondary | ICD-10-CM

## 2019-09-06 DIAGNOSIS — H524 Presbyopia: Secondary | ICD-10-CM | POA: Diagnosis not present

## 2019-09-06 DIAGNOSIS — F3289 Other specified depressive episodes: Secondary | ICD-10-CM | POA: Diagnosis not present

## 2019-09-06 DIAGNOSIS — F32A Depression, unspecified: Secondary | ICD-10-CM | POA: Insufficient documentation

## 2019-09-06 DIAGNOSIS — H25013 Cortical age-related cataract, bilateral: Secondary | ICD-10-CM | POA: Diagnosis not present

## 2019-09-06 NOTE — Patient Instructions (Signed)
Try tapering off the lexapro.  Stay on the wellbutrin for now.    Treat your cold symptoms with over the counter medications.  If your symptoms do not improve let me now.

## 2019-09-06 NOTE — Assessment & Plan Note (Signed)
Symptoms likely viral in nature Continue symptomatic treatment with over-the-counter cold medications, Tylenol/ibuprofen Increase rest and fluids Call if symptoms worsen or do not improve 

## 2019-09-06 NOTE — Assessment & Plan Note (Signed)
Chronic Follows with Dr Toy Care, but wanted to discussed tapering off of medication She denies depression and is concerned the medication is causing fatigue, is not needed and is dulling her emotions too much Decrease lexapro to 1/2-3/4 tab for one week or longer - if no side effects slower decrease dose and see how she feels.  If she feels inc depression or anxiety can restart full dose Continue bupropion at current dose for now - can consider taper off at a later date F/u with questions. Will see Dr Toy Care in 2 months

## 2019-09-10 ENCOUNTER — Telehealth: Payer: Self-pay | Admitting: Internal Medicine

## 2019-09-10 NOTE — Progress Notes (Signed)
  Chronic Care Management   Outreach Note  09/10/2019 Name: Jasmine Bray MRN: [id number] DOB: [birth year]  Referred by: Binnie Rail, MD Reason for referral : No chief complaint on file.   An unsuccessful telephone outreach was attempted today. The patient was referred to the pharmacist for assistance with care management and care coordination. This note is not being shared with the patient for the following reason: To respect privacy (The patient or proxy has requested that the information not be shared).  Follow Up Plan:   Earney Hamburg Upstream Scheduler

## 2019-09-14 ENCOUNTER — Telehealth: Payer: Self-pay | Admitting: Internal Medicine

## 2019-09-14 DIAGNOSIS — I10 Essential (primary) hypertension: Secondary | ICD-10-CM

## 2019-09-14 NOTE — Progress Notes (Signed)
  Chronic Care Management   Note  09/14/2019 Name: BONNETTA ALLBEE MRN: [id number] DOB: Jun 14, 1950  Francine Graven is a 69 y.o. year old female who is a primary care patient of Burns, Claudina Lick, MD. I reached out to [employer] by phone today in response to a referral sent by Ms. Perlie Mayo Golden's PCP, Binnie Rail, MD.   Ms. Dufner was given information about Chronic Care Management services today including:  1. CCM service includes personalized support from designated clinical staff supervised by her physician, including individualized plan of care and coordination with other care providers 2. 24/7 contact phone numbers for assistance for urgent and routine care needs. 3. Service will only be billed when office clinical staff spend 20 minutes or more in a month to coordinate care. 4. Only one practitioner may furnish and bill the service in a calendar month. 5. The patient may stop CCM services at any time (effective at the end of the month) by phone call to the office staff.   Patient agreed to services and verbal consent obtained.   Follow up plan:  Earney Hamburg Upstream Scheduler

## 2019-09-14 NOTE — Progress Notes (Signed)
  Chronic Care Management   Outreach Note  09/14/2019 Name: [name] MRN: [id number] DOB: November 02, 1950  Referred by: Binnie Rail, MD Reason for referral : No chief complaint on file.   An unsuccessful telephone outreach was attempted today. The patient was referred to the pharmacist for assistance with care management and care coordination.   Follow Up Plan:   Earney Hamburg Upstream Scheduler

## 2019-10-04 ENCOUNTER — Encounter: Payer: Self-pay | Admitting: Internal Medicine

## 2019-10-16 ENCOUNTER — Ambulatory Visit: Payer: PPO | Admitting: Family Medicine

## 2019-10-16 ENCOUNTER — Encounter: Payer: Self-pay | Admitting: Family Medicine

## 2019-10-16 ENCOUNTER — Other Ambulatory Visit: Payer: Self-pay

## 2019-10-16 ENCOUNTER — Ambulatory Visit (INDEPENDENT_AMBULATORY_CARE_PROVIDER_SITE_OTHER): Payer: PPO

## 2019-10-16 VITALS — BP 126/84 | HR 65 | Ht 68.0 in | Wt 183.6 lb

## 2019-10-16 DIAGNOSIS — M79605 Pain in left leg: Secondary | ICD-10-CM | POA: Diagnosis not present

## 2019-10-16 DIAGNOSIS — M1612 Unilateral primary osteoarthritis, left hip: Secondary | ICD-10-CM | POA: Diagnosis not present

## 2019-10-16 DIAGNOSIS — M5116 Intervertebral disc disorders with radiculopathy, lumbar region: Secondary | ICD-10-CM | POA: Diagnosis not present

## 2019-10-16 DIAGNOSIS — M25552 Pain in left hip: Secondary | ICD-10-CM

## 2019-10-16 MED ORDER — PREDNISONE 50 MG PO TABS: 50.0000 mg | ORAL_TABLET | Freq: Every day | ORAL | 0 refills | Status: DC

## 2019-10-16 NOTE — Patient Instructions (Signed)
Thank you for coming in today. Plan for PT and xrays today.  Ok to increase gabapentin up to 300mg  3x daily.  Schedule with me for about 2 weeks.  If not improving can do trials of injection.

## 2019-10-16 NOTE — Progress Notes (Signed)
I, Wendy Poet, LAT, ATC, am serving as scribe for Dr. Lynne Leader.  Jasmine Bray is a 69 y.o. female who presents to [facility] today for LBP and L leg pain/sciatica.  She was last seen by Dr. Raeford Razor on 08/19/19 for her R shoulder.  Today, she reports low back pain and L leg pain x approximately 6 weeks.  She has been in contact w/ her PCP and asked if she should use some leftover prednisone to help w/ her pain and was advised to do this.  The prednisone helped relieve the shooting pain down her L leg and pain is now more localized to her L hip/groin.  She notes the were ongoing prior to fall.  However few weeks ago she fell landed on her left buttocks and bruised her buttocks.  Since then the pain worsened quite a bit.  She had some improvement in her pain with course of oral prednisone and gabapentin but still has quite a bit of buttocks lateral hip and anterior hip pain.  She denies much pain radiating below the level of the knee.  Radiating pain: yes, into the L LE mostly in her L hip and thigh L LE numbness/tingling: yes Aggravating factors: transitioning from sit-to-stand and vice-versa; walking Treatments tried: prednisone; Gabapentin; Advil   Pertinent review of systems: No fevers or chills  Relevant historical information: Hypertension   Exam:  BP 126/84 (BP Location: Right Arm, Patient Position: Sitting, Cuff Size: Normal)   Pulse 65   Ht 5\' 8"  (1.727 m)   Wt 183 lb 9.6 oz (83.3 kg)   SpO2 97%   BMI 27.92 kg/m  General: Well Developed, well nourished, and in no acute distress.   MSK: L-spine normal-appearing nontender normal lumbar motion. Lower extremity strength is intact except noted below. Reflexes and sensation are intact distally.  Left hip normal-appearing normal motion.  Tender palpation greater trochanter and tender palpation at piriformis area.  Nontender ischial tuberosity. Hip abduction strength and external rotation  strength diminished 4/5 with pain.    Lab and Radiology Results  X-ray images lumbar spine and left hip obtained today personally and independently reviewed  L-spine: DDD worst at L5-S1.  Some DDD at T12-L1.  No acute fractures.  Left hip: No significant degenerative changes.  Enthesopathy changes present at left greater trochanter and ischial tuberosity.  Await formal radiology review    Assessment and Plan: 69 y.o. female with left hip and buttocks pain.  Primary pain is probably trochanteric bursitis/hip abductor tendinopathy.  I believe she also has a component of piriformis syndrome.  She does have some anterior hip pain which is a bit puzzling.  It is possible she has a labrum tear or more degenerative changes than is appreciable on x-ray today.  Plan for brief trial of physical therapy.  Check back in 2 weeks if not better would consider ultrasound-guided injection.  Additionally could increase gabapentin.  She is only taking 100 mg 3 times daily as needed.  Could increase to 300 mg 3 times daily if needed.  Backup course of prednisone prescribed for use if worsening.   Orders Placed This Encounter  Procedures  . DG Hip Unilat W OR W/O Pelvis 2-3 Views Left    Standing Status:   Future    Number of Occurrences:   1    Standing Expiration Date:   10/15/2020    Order Specific Question:   Reason for Exam (SYMPTOM  OR DIAGNOSIS REQUIRED)  Answer:   eval left hip and buttock pain    Order Specific Question:   Preferred imaging location?    Answer:   [facility]    Order Specific Question:   Radiology Contrast Protocol - do NOT remove file path    Answer:   \\[email or web address].pdf  . DG Lumbar Spine 2-3 Views    Standing Status:   Future    Number of Occurrences:   1    Standing Expiration Date:   10/15/2020    Order Specific Question:   Reason for Exam (SYMPTOM  OR DIAGNOSIS REQUIRED)    Answer:   eval possible left lumbar  radiculopathy    Order Specific Question:   Preferred imaging location?    Answer:   [facility]    Order Specific Question:   Radiology Contrast Protocol - do NOT remove file path    Answer:   \\[email or web address].pdf  . Ambulatory referral to Physical Therapy    Referral Priority:   Routine    Referral Type:   Physical Medicine    Referral Reason:   Specialty Services Required    Requested Specialty:   Physical Therapy   Meds ordered this encounter  Medications  . predniSONE (DELTASONE) 50 MG tablet    Sig: Take 1 tablet (50 mg total) by mouth daily.    Dispense:  5 tablet    Refill:  0     Discussed warning signs or symptoms. Please see discharge instructions. Patient expresses understanding.   The above documentation has been reviewed and is accurate and complete Lynne Leader, M.D.

## 2019-10-17 NOTE — Progress Notes (Signed)
X-ray shows a little arthritis in the hip as well as evidence of chronic tendinitis in the hip.

## 2019-10-17 NOTE — Progress Notes (Signed)
X-ray lumbar spine shows medium arthritis.  Incidentally it also looks like you may have a gallstone.

## 2019-10-24 ENCOUNTER — Ambulatory Visit: Payer: PPO | Admitting: Family Medicine

## 2019-10-28 ENCOUNTER — Encounter: Payer: Self-pay | Admitting: Family Medicine

## 2019-10-29 ENCOUNTER — Encounter: Payer: Self-pay | Admitting: Family Medicine

## 2019-10-29 ENCOUNTER — Ambulatory Visit (INDEPENDENT_AMBULATORY_CARE_PROVIDER_SITE_OTHER): Payer: PPO | Admitting: Family Medicine

## 2019-10-29 ENCOUNTER — Ambulatory Visit: Payer: Self-pay

## 2019-10-29 VITALS — BP 158/92 | HR 71 | Ht 68.0 in | Wt 183.0 lb

## 2019-10-29 DIAGNOSIS — M25552 Pain in left hip: Secondary | ICD-10-CM

## 2019-10-29 MED ORDER — METHYLPREDNISOLONE ACETATE 40 MG/ML IJ SUSP
40.0000 mg | Freq: Once | INTRAMUSCULAR | Status: AC
  Administered 2019-10-29: 40 mg via INTRA_ARTICULAR

## 2019-10-29 NOTE — Progress Notes (Signed)
[name] - 69 y.o. female MRN [id number]  Date of birth: November 08, 1950  SUBJECTIVE:  Including CC & ROS.  Chief Complaint  Patient presents with  . Hip Pain    left    Jasmine Bray is a 69 y.o. female that is presenting with acute left lateral posterior hip pain.  She had a fall about a month ago.  It has acutely worsened and is becoming more severe.  She is having pain localized to this region.  Has tried medications with limited improvement.  .  Independent review of the left hip x-ray and AP pelvis from 7/20 shows bone spurring at the greater trochanter.   Review of Systems See HPI   HISTORY: Past Medical, Surgical, Social, and Family History Reviewed & Updated per EMR.   Pertinent Historical Findings include:  Past Medical History:  Diagnosis Date  . Allergy    RHINITIS  . Anxiety 1989   Dr Jake Michaelis, [place]  . Depression    Dr Toy Care  . Fibroids   . GERD 11/11/2009   Qualifier: Diagnosis of  By: Linna Darner MD, Gwyndolyn Saxon   Dysphagia once monthly on average 4-10/14 ; resolved with Prilosec OTC prn No PMH of endoscopy    . GERD (gastroesophageal reflux disease)   . Hyperlipidemia   . Hypertension     Past Surgical History:  Procedure Laterality Date  . ABDOMINAL HYSTERECTOMY     & USO  . ANAL FISTULA ECTOMY      X 2, Dr Rosana Hoes  . COLONOSCOPY  2004, 2015   Carlean Purl  . INJECTION KNEE Bilateral    [employer]  . MYOMECTOMY     FOR FIBROIDS, Dr Ubaldo Glassing  . steroid injection shoulder Right 05/11/2013   contusion/strain; Dr Onnie Graham    Family History  Problem Relation Age of Onset  . Arthritis Mother   . Stroke Mother 57  . Alcohol abuse Father   . Hyperlipidemia Sister   . Hypertension Sister   . Atrial fibrillation Sister   . Breast cancer Paternal Aunt   . Diabetes Maternal Grandmother   . Heart disease Paternal Grandmother        CAD; no MI  . Stroke Paternal Grandmother        in 20s  . Stroke Paternal Grandfather        in late 18s  . Colon cancer Neg Hx      Social History   Socioeconomic History  . Marital status: Divorced    Spouse name: Not on file  . Number of children: Not on file  . Years of education: Not on file  . Highest education level: Not on file  Occupational History  . Occupation: Press photographer  Tobacco Use  . Smoking status: Never Smoker  . Smokeless tobacco: Never Used  Vaping Use  . Vaping Use: Never used  Substance and Sexual Activity  . Alcohol use: Yes    Alcohol/week: 7.0 - 10.0 standard drinks    Types: 7 - 10 Glasses of wine per week    Comment: 1-2 galsses of wine daily  . Drug use: No  . Sexual activity: Not Currently  Other Topics Concern  . Not on file  Social History Narrative   GETS REG EXERCISE         Social Determinants of Health   Financial Resource Strain:   . Difficulty of Paying Living Expenses:   Food Insecurity:   . Worried About Charity fundraiser in the Last Year:   .  Ran Out of Food in the Last Year:   Transportation Needs:   . Film/video editor (Medical):   Marland Kitchen Lack of Transportation (Non-Medical):   Physical Activity: Sufficiently Active  . Days of Exercise per Week: 5 days  . Minutes of Exercise per Session: 50 min  Stress:   . Feeling of Stress :   Social Connections: Unknown  . Frequency of Communication with Friends and Family: Not on file  . Frequency of Social Gatherings with Friends and Family: Not on file  . Attends Religious Services: 1 to 4 times per year  . Active Member of Clubs or Organizations: Yes  . Attends Archivist Meetings: More than 4 times per year  . Marital Status: Not on file  Intimate Partner Violence:   . Fear of Current or Ex-Partner:   . Emotionally Abused:   Marland Kitchen Physically Abused:   . Sexually Abused:      PHYSICAL EXAM:  VS: BP (!) 158/92   Pulse 71   Ht 5\' 8"  (1.727 m)   Wt 183 lb (83 kg)   BMI 27.83 kg/m  Physical Exam Gen: NAD, alert, cooperative with exam, well-appearing MSK:  Left hip: No ecchymosis. Tenderness  palpation over trochanter. Back mouse evident over the SI joint. Pain with hip abduction. Neurovascularly intact   Aspiration/Injection Procedure Note PERIAN TEDDER 10/05/1950  Procedure: Injection Indications: Left hip pain  Procedure Details Consent: Risks of procedure as well as the alternatives and risks of each were explained to the (patient/caregiver).  Consent for procedure obtained. Time Out: Verified patient identification, verified procedure, site/side was marked, verified correct patient position, special equipment/implants available, medications/allergies/relevent history reviewed, required imaging and test results available.  Performed.  The area was cleaned with iodine and alcohol swabs.    The left greater trochanteric area was injected using 1 cc's of 40 mg Depo-Medrol and 4 cc's of 0.25% bupivacaine with a 22 3 1/2" needle.  Ultrasound was used. Images were obtained in short views showing the injection.     A sterile dressing was applied.  Patient did tolerate procedure well.     ASSESSMENT & PLAN:   Greater trochanteric pain syndrome of left lower extremity Has significant pain over the greater trochanter.  Had a fall about a month ago and pain has gotten progressively worse.  She does have a back mouse over the SI joint which could be contributing to some of the pain as well. -Counseled on home exercise therapy and supportive care. -Injection. -May need to consider injecting the back mouse or the SI joint.

## 2019-10-29 NOTE — Assessment & Plan Note (Signed)
Has significant pain over the greater trochanter.  Had a fall about a month ago and pain has gotten progressively worse.  She does have a back mouse over the SI joint which could be contributing to some of the pain as well. -Counseled on home exercise therapy and supportive care. -Injection. -May need to consider injecting the back mouse or the SI joint.

## 2019-10-29 NOTE — Patient Instructions (Signed)
Good to see you Please try ice  We may need to try an inject the back mouse   Please send me a message in MyChart with any questions or updates.  Please see me back in 4 weeks or as needed if better.   --Dr. Raeford Razor

## 2019-10-30 ENCOUNTER — Ambulatory Visit: Payer: PPO | Admitting: Family Medicine

## 2019-11-05 ENCOUNTER — Encounter: Payer: Self-pay | Admitting: Family Medicine

## 2019-11-07 ENCOUNTER — Ambulatory Visit: Payer: PPO | Admitting: Family Medicine

## 2019-11-07 ENCOUNTER — Encounter: Payer: Self-pay | Admitting: Family Medicine

## 2019-11-07 ENCOUNTER — Other Ambulatory Visit: Payer: Self-pay

## 2019-11-07 DIAGNOSIS — M5432 Sciatica, left side: Secondary | ICD-10-CM

## 2019-11-07 DIAGNOSIS — G8929 Other chronic pain: Secondary | ICD-10-CM | POA: Diagnosis not present

## 2019-11-07 DIAGNOSIS — M533 Sacrococcygeal disorders, not elsewhere classified: Secondary | ICD-10-CM | POA: Diagnosis not present

## 2019-11-07 MED ORDER — TRIAMCINOLONE ACETONIDE 40 MG/ML IJ SUSP
40.0000 mg | Freq: Once | INTRAMUSCULAR | Status: AC
  Administered 2019-11-07: 40 mg via INTRA_ARTICULAR

## 2019-11-07 MED ORDER — GABAPENTIN 100 MG PO CAPS: 100.0000 mg | ORAL_CAPSULE | Freq: Three times a day (TID) | ORAL | 3 refills | Status: DC

## 2019-11-07 NOTE — Assessment & Plan Note (Signed)
Has pain over the left SI joint and a back mouse is appreciated.  These are likely result of falls and were exacerbated by her most recent fall. -Counseled on home exercise therapy in supportive care. -Trigger point injection. -Could consider SI joint injection.

## 2019-11-07 NOTE — Patient Instructions (Signed)
Good to see you Please try heat  Please try the exercises   Please send me a message in MyChart with any questions or updates.  Please see me back in 4 weeks or sooner if needed.   --Dr. Raeford Razor

## 2019-11-07 NOTE — Assessment & Plan Note (Signed)
Having radicular type symptoms in this leg.  Possibly related to SI joint or piriformis.  Has had multiple falls and seems to have been exacerbated by most recent fall. -Refill gabapentin.

## 2019-11-07 NOTE — Progress Notes (Signed)
Jasmine Bray - 69 y.o. female MRN [id number]  Date of birth: [birth year]  SUBJECTIVE:  Including CC & ROS.  Chief Complaint  Patient presents with  . Follow-up    left hip    Jasmine Bray is a 69 y.o. female that is presenting with ongoing SI joint pain and radicular type pain.  She received a greater trochanteric injection had improvement of her symptoms.  She is still having pain over the SI joint as well as some radicular type symptoms down the legs.  Has been ongoing since her fall.  It is intermittent in nature and is worse with getting up out of a car or lying down..    Review of Systems See HPI   HISTORY: Past Medical, Surgical, Social, and Family History Reviewed & Updated per EMR.   Pertinent Historical Findings include:  Past Medical History:  Diagnosis Date  . Allergy    RHINITIS  . Anxiety 1989   Dr Jake Michaelis, [place]  . Depression    Dr Toy Care  . Fibroids   . GERD 11/11/2009   Qualifier: Diagnosis of  By: Linna Darner MD, Gwyndolyn Saxon   Dysphagia once monthly on average 4-10/14 ; resolved with Prilosec OTC prn No PMH of endoscopy    . GERD (gastroesophageal reflux disease)   . Hyperlipidemia   . Hypertension     Past Surgical History:  Procedure Laterality Date  . ABDOMINAL HYSTERECTOMY     & USO  . ANAL FISTULA ECTOMY      X 2, Dr Rosana Hoes  . COLONOSCOPY  2004, 2015   Carlean Purl  . INJECTION KNEE Bilateral    [employer]  . MYOMECTOMY     FOR FIBROIDS, Dr Ubaldo Glassing  . steroid injection shoulder Right 05/11/2013   contusion/strain; Dr Onnie Graham    Family History  Problem Relation Age of Onset  . Arthritis Mother   . Stroke Mother 80  . Alcohol abuse Father   . Hyperlipidemia Sister   . Hypertension Sister   . Atrial fibrillation Sister   . Breast cancer Paternal Aunt   . Diabetes Maternal Grandmother   . Heart disease Paternal Grandmother        CAD; no MI  . Stroke Paternal Grandmother        in 76s  . Stroke Paternal Grandfather        in late 14s  . Colon cancer Neg  Hx     Social History   Socioeconomic History  . Marital status: Divorced    Spouse name: Not on file  . Number of children: Not on file  . Years of education: Not on file  . Highest education level: Not on file  Occupational History  . Occupation: Press photographer  Tobacco Use  . Smoking status: Never Smoker  . Smokeless tobacco: Never Used  Vaping Use  . Vaping Use: Never used  Substance and Sexual Activity  . Alcohol use: Yes    Alcohol/week: 7.0 - 10.0 standard drinks    Types: 7 - 10 Glasses of wine per week    Comment: 1-2 galsses of wine daily  . Drug use: No  . Sexual activity: Not Currently  Other Topics Concern  . Not on file  Social History Narrative   GETS REG EXERCISE         Social Determinants of Health   Financial Resource Strain:   . Difficulty of Paying Living Expenses:   Food Insecurity:   . Worried About Charity fundraiser in the  Last Year:   . [place] in the Last Year:   Transportation Needs:   . Film/video editor (Medical):   Marland Kitchen Lack of Transportation (Non-Medical):   Physical Activity: Sufficiently Active  . Days of Exercise per Week: 5 days  . Minutes of Exercise per Session: 50 min  Stress:   . Feeling of Stress :   Social Connections: Unknown  . Frequency of Communication with Friends and Family: Not on file  . Frequency of Social Gatherings with Friends and Family: Not on file  . Attends Religious Services: 1 to 4 times per year  . Active Member of Clubs or Organizations: Yes  . Attends Archivist Meetings: More than 4 times per year  . Marital Status: Not on file  Intimate Partner Violence:   . Fear of Current or Ex-Partner:   . Emotionally Abused:   Marland Kitchen Physically Abused:   . Sexually Abused:      PHYSICAL EXAM:  VS: BP (!) 153/87   Pulse (!) 58   Ht 5\' 8"  (1.727 m)   Wt 183 lb (83 kg)   BMI 27.83 kg/m  Physical Exam Gen: NAD, alert, cooperative with exam, well-appearing MSK:  Back: Tenderness to palpation  over the SI joint and the back mouse Internal and external rotation of the hips. No tenderness palpation of the greater trochanter. Neurovascularly intact   Aspiration/Injection Procedure Note Jasmine Bray [birth year]  Procedure: Injection Indications: left trigger point/back mouse injection   Procedure Details Consent: Risks of procedure as well as the alternatives and risks of each were explained to the (patient/caregiver).  Consent for procedure obtained. Time Out: Verified patient identification, verified procedure, site/side was marked, verified correct patient position, special equipment/implants available, medications/allergies/relevent history reviewed, required imaging and test results available.  Performed.  The area was cleaned with iodine and alcohol swabs.    The left back mouse/trigger point was injected using 1 cc's of 40 mg Kenalog and 4 cc's of 0.25% bupivacaine with a 25 1 " needle.      A sterile dressing was applied.  Patient did tolerate procedure well.     ASSESSMENT & PLAN:   Chronic left SI joint pain Has pain over the left SI joint and a back mouse is appreciated.  These are likely result of falls and were exacerbated by her most recent fall. -Counseled on home exercise therapy in supportive care. -Trigger point injection. -Could consider SI joint injection.  Sciatica of left side Having radicular type symptoms in this leg.  Possibly related to SI joint or piriformis.  Has had multiple falls and seems to have been exacerbated by most recent fall. -Refill gabapentin.

## 2019-11-11 ENCOUNTER — Encounter: Payer: Self-pay | Admitting: Internal Medicine

## 2019-11-18 ENCOUNTER — Encounter: Payer: Self-pay | Admitting: Family Medicine

## 2019-11-19 ENCOUNTER — Ambulatory Visit: Payer: Self-pay

## 2019-11-19 ENCOUNTER — Encounter: Payer: Self-pay | Admitting: Family Medicine

## 2019-11-19 ENCOUNTER — Ambulatory Visit (INDEPENDENT_AMBULATORY_CARE_PROVIDER_SITE_OTHER): Payer: PPO | Admitting: Family Medicine

## 2019-11-19 VITALS — Ht 67.0 in | Wt 180.0 lb

## 2019-11-19 DIAGNOSIS — M533 Sacrococcygeal disorders, not elsewhere classified: Secondary | ICD-10-CM | POA: Diagnosis not present

## 2019-11-19 DIAGNOSIS — G8929 Other chronic pain: Secondary | ICD-10-CM

## 2019-11-19 MED ORDER — TRIAMCINOLONE ACETONIDE 40 MG/ML IJ SUSP
40.0000 mg | Freq: Once | INTRAMUSCULAR | Status: AC
  Administered 2019-11-19: 40 mg via INTRA_ARTICULAR

## 2019-11-19 NOTE — Patient Instructions (Signed)
Good to see you Please try ice  Please let me know by Friday if your pain hasn't improve.   Please send me a message in MyChart with any questions or updates.  Please see me back in 4 weeks.   --Dr. Raeford Razor

## 2019-11-19 NOTE — Assessment & Plan Note (Signed)
Pain has been ongoing since her fall.  Does get improvement with lateral hip pain from previous injection.  Her gluteus pain that also radiates to the proximal hamstrings is ongoing.  Unclear if she may have had a occult fracture or radicular type symptoms from the lumbar area.  Seems to have some SI joint component to it. -Counseled on supportive care. -SI joint injection. -If no improvement consider MRI of the pelvis versus lumbar spine.

## 2019-11-19 NOTE — Progress Notes (Signed)
[name] - 69 y.o. female MRN [id number]  Date of birth: May 11, 1950  SUBJECTIVE:  Including CC & ROS.  Chief Complaint  Patient presents with  . Follow-up    left SI joint    Jasmine Bray is a 69 y.o. female that is presenting with ongoing left SI joint pain.  She is also experiencing pain rating down to the gluteus and the proximal hamstrings.  Had minor improvement with back mouse injection.   Review of Systems See HPI   HISTORY: Past Medical, Surgical, Social, and Family History Reviewed & Updated per EMR.   Pertinent Historical Findings include:  Past Medical History:  Diagnosis Date  . Allergy    RHINITIS  . Anxiety 1989   Dr Jake Michaelis, [place]  . Depression    Dr Toy Care  . Fibroids   . GERD 11/11/2009   Qualifier: Diagnosis of  By: Linna Darner MD, Gwyndolyn Saxon   Dysphagia once monthly on average 4-10/14 ; resolved with Prilosec OTC prn No PMH of endoscopy    . GERD (gastroesophageal reflux disease)   . Hyperlipidemia   . Hypertension     Past Surgical History:  Procedure Laterality Date  . ABDOMINAL HYSTERECTOMY     & USO  . ANAL FISTULA ECTOMY      X 2, Dr Rosana Hoes  . COLONOSCOPY  2004, 2015   Carlean Purl  . INJECTION KNEE Bilateral    [facility]  . MYOMECTOMY     FOR FIBROIDS, Dr Ubaldo Glassing  . steroid injection shoulder Right 05/11/2013   contusion/strain; Dr Onnie Graham    Family History  Problem Relation Age of Onset  . Arthritis Mother   . Stroke Mother 61  . Alcohol abuse Father   . Hyperlipidemia Sister   . Hypertension Sister   . Atrial fibrillation Sister   . Breast cancer Paternal Aunt   . Diabetes Maternal Grandmother   . Heart disease Paternal Grandmother        CAD; no MI  . Stroke Paternal Grandmother        in 36s  . Stroke Paternal Grandfather        in late 74s  . Colon cancer Neg Hx     Social History   Socioeconomic History  . Marital status: Divorced    Spouse name: Not on file  . Number of children: Not on file  . Years of education: Not on file   . Highest education level: Not on file  Occupational History  . Occupation: Press photographer  Tobacco Use  . Smoking status: Never Smoker  . Smokeless tobacco: Never Used  Vaping Use  . Vaping Use: Never used  Substance and Sexual Activity  . Alcohol use: Yes    Alcohol/week: 7.0 - 10.0 standard drinks    Types: 7 - 10 Glasses of wine per week    Comment: 1-2 galsses of wine daily  . Drug use: No  . Sexual activity: Not Currently  Other Topics Concern  . Not on file  Social History Narrative   GETS REG EXERCISE         Social Determinants of Health   Financial Resource Strain:   . Difficulty of Paying Living Expenses: Not on file  Food Insecurity:   . Worried About Charity fundraiser in the Last Year: Not on file  . Ran Out of Food in the Last Year: Not on file  Transportation Needs:   . Lack of Transportation (Medical): Not on file  . Lack of Transportation (  Non-Medical): Not on file  Physical Activity: Sufficiently Active  . Days of Exercise per Week: 5 days  . Minutes of Exercise per Session: 50 min  Stress:   . Feeling of Stress : Not on file  Social Connections: Unknown  . Frequency of Communication with Friends and Family: Not on file  . Frequency of Social Gatherings with Friends and Family: Not on file  . Attends Religious Services: 1 to 4 times per year  . Active Member of Clubs or Organizations: Yes  . Attends Archivist Meetings: More than 4 times per year  . Marital Status: Not on file  Intimate Partner Violence:   . Fear of Current or Ex-Partner: Not on file  . Emotionally Abused: Not on file  . Physically Abused: Not on file  . Sexually Abused: Not on file     PHYSICAL EXAM:  VS: Ht 5\' 7"  (1.702 m)   Wt 180 lb (81.6 kg)   BMI 28.19 kg/m  Physical Exam Gen: NAD, alert, cooperative with exam, well-appearing MSK:  SI joint:  No ecchymosis or bruising. Tenderness to palpation of the left SI joint. Neurovascularly  intact   Aspiration/Injection Procedure Note Jasmine Bray 24-Aug-1950  Procedure: Injection  Indications: Left SI joint pain  Procedure Details Consent: Risks of procedure as well as the alternatives and risks of each were explained to the (patient/caregiver).  Consent for procedure obtained. Time Out: Verified patient identification, verified procedure, site/side was marked, verified correct patient position, special equipment/implants available, medications/allergies/relevent history reviewed, required imaging and test results available.  Performed.  The area was cleaned with iodine and alcohol swabs.    The left SI joint was injected using 4 cc of 1% lidocaine on a 22-gauge 3-1/2 inch needle.  There is syringe was switched and a mixture containing 1 cc's of 40 mg Kenalog and 4 cc's of 0.25% bupivacaine was injected.  Ultrasound was used. Images were obtained in long views showing the injection.     A sterile dressing was applied.  Patient did tolerate procedure well.     ASSESSMENT & PLAN:   Chronic left SI joint pain Pain has been ongoing since her fall.  Does get improvement with lateral hip pain from previous injection.  Her gluteus pain that also radiates to the proximal hamstrings is ongoing.  Unclear if she may have had a occult fracture or radicular type symptoms from the lumbar area.  Seems to have some SI joint component to it. -Counseled on supportive care. -SI joint injection. -If no improvement consider MRI of the pelvis versus lumbar spine.

## 2019-11-22 ENCOUNTER — Encounter: Payer: Self-pay | Admitting: Family Medicine

## 2019-11-22 ENCOUNTER — Other Ambulatory Visit: Payer: Self-pay

## 2019-11-22 MED ORDER — HYDROCHLOROTHIAZIDE 25 MG PO TABS: 25.0000 mg | ORAL_TABLET | Freq: Every day | ORAL | 3 refills | Status: DC

## 2019-11-28 ENCOUNTER — Other Ambulatory Visit: Payer: Self-pay | Admitting: Family Medicine

## 2019-11-28 DIAGNOSIS — M533 Sacrococcygeal disorders, not elsewhere classified: Secondary | ICD-10-CM

## 2019-11-28 NOTE — Progress Notes (Signed)
She is having ongoing SI joint pain and left hip pain as well as radicular type symptoms after a fall she sustained from horse.  Concern for possible pelvic fracture.  Rosemarie Ax, MD [facility] 11/28/2019, 8:13 AM

## 2019-12-02 ENCOUNTER — Encounter: Payer: Self-pay | Admitting: *Deleted

## 2019-12-02 ENCOUNTER — Other Ambulatory Visit: Payer: Self-pay

## 2019-12-02 ENCOUNTER — Ambulatory Visit
Admission: EM | Admit: 2019-12-02 | Discharge: 2019-12-02 | Disposition: A | Discharge status: Home / Self Care | Payer: PPO

## 2019-12-02 DIAGNOSIS — R519 Headache, unspecified: Secondary | ICD-10-CM

## 2019-12-02 DIAGNOSIS — R0981 Nasal congestion: Secondary | ICD-10-CM | POA: Diagnosis not present

## 2019-12-02 DIAGNOSIS — R6883 Chills (without fever): Secondary | ICD-10-CM | POA: Diagnosis not present

## 2019-12-02 HISTORY — DX: Unspecified injury of lower back, initial encounter: S39.92XA

## 2019-12-02 NOTE — ED Triage Notes (Signed)
C/O general malaise x4 days. C/O chills, fever, HA.  Reports change in taste, decreased appetite.

## 2019-12-02 NOTE — ED Provider Notes (Signed)
[facility]    CSN: [id number] Arrival date & time: 12/02/19  1514      History   Chief Complaint No chief complaint on file.   HPI Jasmine Bray is a 69 y.o. female.   69 year old feamle comes in for 4 day of general malaise, fever, chills, headache. tmax 101, fever has since resolved. Mild nasal congestion, changes in taste, decreased appetite. Denies shortness of breath. Never smoker. No tick bites, rashes.   Patient lost balance after getting out of bed a few days ago. Denies fall, but hit left forehead. Denies nausea, vomiting, syncope. Headache already present prior injury.      Past Medical History:  Diagnosis Date  . Allergy    RHINITIS  . Anxiety 1989   Dr Jake Michaelis, [place]  . Back injury   . Depression    Dr Toy Care  . Fibroids   . GERD 11/11/2009   Qualifier: Diagnosis of  By: Linna Darner MD, Gwyndolyn Saxon   Dysphagia once monthly on average 4-10/14 ; resolved with Prilosec OTC prn No PMH of endoscopy    . GERD (gastroesophageal reflux disease)   . Hyperlipidemia   . Hypertension     Patient Active Problem List   Diagnosis Date Noted  . 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  . 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  . URI (upper respiratory infection) 09/06/2019  . Depression 09/06/2019  . History of melanoma 02/20/2019  . Osteoarthritis 02/20/2019  . Primary osteoarthritis of right knee 07/18/2018  . Sciatica of left side 10/10/2017  . Chronic knee pain 07/25/2017  . Left upper arm pain 03/17/2016  . Hepatitis C antibody test positive 01/29/2016  . Acute pain of right shoulder 04/24/2013  . Fasting hyperglycemia 01/10/2012  . Hyperlipidemia 11/11/2009  . 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 Essential hypertension 11/11/2009    Past Surgical History:  Procedure Laterality Date  . ABDOMINAL HYSTERECTOMY     & USO  . ANAL FISTULA ECTOMY      X 2, Dr Rosana Hoes  . COLONOSCOPY  2004, 2015   Carlean Purl  . INJECTION KNEE  Bilateral    [facility]  . MYOMECTOMY     FOR FIBROIDS, Dr Ubaldo Glassing  . steroid injection shoulder Right 05/11/2013   contusion/strain; Dr Onnie Graham    OB History   No obstetric history on file.      Home Medications    Prior to Admission medications   Medication Sig Start Date End Date Taking? Authorizing Provider  ALPRAZolam Duanne Moron) 1 MG tablet Take 1 mg by mouth as needed.   Yes [provider]  aspirin EC 81 MG tablet Take 81 mg by mouth daily.   Yes [provider]  Biotin w/ Vitamins C & E (HAIR/SKIN/NAILS PO) Take by mouth.   Yes [provider]  buPROPion (WELLBUTRIN XL) 150 MG 24 hr tablet Take 150 mg by mouth daily.   Yes [provider]  Cholecalciferol (VITAMIN D3) 3000 UNITS TABS Take 1 capsule by mouth daily. Taking 1000 units daily   Yes [provider]  estradiol (ESTRACE) 0.1 MG/GM vaginal cream Place 2 g vaginally. 1 GRAM 3x A WEEK    Yes [provider]  estradiol (EVAMIST) 1.53 MG/SPRAY transdermal spray Evamist 1.53 mg/spray (1.7 %) transdermal spray  APPLY 1 SPRAY DAILY AS DIRECTED   Yes [provider]  gabapentin (NEURONTIN) 100 MG capsule Take 1 capsule (100 mg total) by mouth 3 (three) times  daily. 11/07/19  Yes Rosemarie Ax, MD  hydrochlorothiazide (HYDRODIURIL) 25 MG tablet Take 1 tablet (25 mg total) by mouth daily. 11/22/19  Yes Burns, Claudina Lick, MD  Multiple Vitamin (MULTIVITAMIN) tablet Take 1 tablet by mouth daily.   Yes [provider]  Probiotic Product (PROBIOTIC PEARLS PO) Take 1 tablet by mouth daily.   Yes [provider]  simvastatin (ZOCOR) 40 MG tablet Take 1 tablet (40 mg total) by mouth at bedtime. 02/20/19  Yes Burns, Claudina Lick, MD  Turmeric 500 MG CAPS Take 500 mg by mouth daily.   Yes [provider]  Omega-3 Fatty Acids (FISH OIL) 1000 MG CAPS Take by mouth daily.    [provider]  valACYclovir (VALTREX) 1000 MG tablet Take 1,000 mg by mouth. 1/2  by mouth once daily    [provider]  escitalopram (LEXAPRO) 20 MG tablet Take 10 mg by mouth daily.  Patient not taking: Reported on 10/16/2019  12/02/19  [provider]    Family History Family History  Problem Relation Age of Onset  . Arthritis Mother   . Stroke Mother 31  . Alcohol abuse Father   . Hyperlipidemia Sister   . Hypertension Sister   . Atrial fibrillation Sister   . Breast cancer Paternal Aunt   . Diabetes Maternal Grandmother   . Heart disease Paternal Grandmother        CAD; no MI  . Stroke Paternal Grandmother        in 45s  . Stroke Paternal Grandfather        in late 32s  . Colon cancer Neg Hx     Social History Social History   Tobacco Use  . Smoking status: Never Smoker  . Smokeless tobacco: Never Used  Vaping Use  . Vaping Use: Never used  Substance Use Topics  . Alcohol use: Yes    Comment: decreasing wine intake  . Drug use: No     Allergies   Patient has no known allergies.   Review of Systems Review of Systems  Reason unable to perform ROS: See HPI as above.     Physical Exam Triage Vital Signs ED Triage Vitals  Enc Vitals Group     BP 12/02/19 1613 135/79     Pulse Rate 12/02/19 1613 71     Resp 12/02/19 1613 20     Temp 12/02/19 1613 98.3 F (36.8 C)     Temp Source 12/02/19 1613 Oral     SpO2 12/02/19 1613 95 %     Weight --      Height --      Head Circumference --      Peak Flow --      Pain Score 12/02/19 1621 3     Pain Loc --      Pain Edu? --      Excl. in [place]? --    No data found.  Updated Vital Signs BP 135/79 (BP Location: Left Arm)   Pulse 71   Temp 98.3 F (36.8 C) (Oral)   Resp 20   SpO2 95%   Physical Exam Constitutional:      General: She is not in acute distress.    Appearance: Normal appearance. She is well-developed. She is not ill-appearing, toxic-appearing or diaphoretic.  HENT:     Head: Normocephalic and atraumatic.     Comments: Contusion to the left forehead. No  swelling. No crepitus, deformity felt.     Right Ear:  Tympanic membrane, ear canal and external ear normal. Tympanic membrane is not erythematous or bulging.     Left Ear: Tympanic membrane, ear canal and external ear normal. Tympanic membrane is not erythematous or bulging.     Nose:     Right Sinus: No maxillary sinus tenderness or frontal sinus tenderness.     Left Sinus: No maxillary sinus tenderness or frontal sinus tenderness.     Mouth/Throat:     Mouth: Mucous membranes are moist.     Pharynx: Oropharynx is clear. Uvula midline.  Eyes:     Conjunctiva/sclera: Conjunctivae normal.     Pupils: Pupils are equal, round, and reactive to light.  Cardiovascular:     Rate and Rhythm: Normal rate and regular rhythm.  Pulmonary:     Effort: Pulmonary effort is normal. No accessory muscle usage, prolonged expiration, respiratory distress or retractions.     Breath sounds: No decreased air movement or transmitted upper airway sounds. No decreased breath sounds.     Comments: LCTAB Musculoskeletal:     Cervical back: Normal range of motion and neck supple.  Skin:    General: Skin is warm and dry.  Neurological:     Mental Status: She is alert and oriented to person, place, and time.     Comments: Neurology exam grossly intact. Strength 5/5. Sensation intact. Normal coordination with normal finger to nose. Negative pronator drift, romberg. Gait intact. Able to ambulate on own without difficulty.        UC Treatments / Results  Labs (all labs ordered are listed, but only abnormal results are displayed) Labs Reviewed  NOVEL CORONAVIRUS, NAA    EKG   Radiology No results found.  Procedures Procedures (including critical care time)  Medications Ordered in UC Medications - No data to display  Initial Impression / Assessment and Plan / UC Course  I have reviewed the triage vital signs and the nursing notes.  Pertinent labs & imaging results that were available during my care  of the patient were reviewed by me and considered in my medical decision making (see chart for details).    Covid testing ordered, patient to quarantine and testing results return.  Given also with nasal congestion, changes in taste, no known tick bite, low suspicion for tickborne disease at this time.  Patient with grossly intact neurology exam without focal deficits, low suspicion for intracranial processes causing symptoms.  Will treat symptomatically and monitor closely.  Strict return precautions given.  Patient expresses understanding and agrees to plan.  Final Clinical Impressions(s) / UC Diagnoses   Final diagnoses:  Nasal congestion  Chills  Acute intractable headache, unspecified headache type    ED Prescriptions    None     PDMP not reviewed this encounter.   Ok Edwards, PA-C 12/02/19 1654

## 2019-12-02 NOTE — Discharge Instructions (Signed)
COVID PCR testing ordered. I would like you to quarantine until testing results. You can take over the counter flonase/nasacort to help with nasal congestion/drainage. Tylenol/motrin for pain and fever. Keep hydrated, urine should be clear to pale yellow in color. If experiencing shortness of breath, trouble breathing, go to the emergency department for further evaluation needed. If having sudden worsening of headache, vomiting, confusion, go to the emergency department for further evaluation. If develop rashes, may need to check for tick borne disease.

## 2019-12-03 LAB — NOVEL CORONAVIRUS, NAA: SARS-CoV-2, NAA: NOT DETECTED

## 2019-12-04 ENCOUNTER — Ambulatory Visit: Payer: PPO | Admitting: Family Medicine

## 2019-12-06 ENCOUNTER — Ambulatory Visit
Admission: RE | Admit: 2019-12-06 | Discharge: 2019-12-06 | Disposition: A | Discharge status: Home / Self Care | Payer: PPO | Source: Ambulatory Visit | Attending: Family Medicine | Admitting: Family Medicine

## 2019-12-06 ENCOUNTER — Other Ambulatory Visit: Payer: Self-pay

## 2019-12-06 DIAGNOSIS — M533 Sacrococcygeal disorders, not elsewhere classified: Secondary | ICD-10-CM

## 2019-12-06 DIAGNOSIS — M5136 Other intervertebral disc degeneration, lumbar region: Secondary | ICD-10-CM | POA: Diagnosis not present

## 2019-12-10 ENCOUNTER — Other Ambulatory Visit: Payer: Self-pay

## 2019-12-10 ENCOUNTER — Telehealth (INDEPENDENT_AMBULATORY_CARE_PROVIDER_SITE_OTHER): Payer: PPO | Admitting: Family Medicine

## 2019-12-10 DIAGNOSIS — G959 Disease of spinal cord, unspecified: Secondary | ICD-10-CM

## 2019-12-10 DIAGNOSIS — M5432 Sciatica, left side: Secondary | ICD-10-CM | POA: Diagnosis not present

## 2019-12-11 NOTE — Assessment & Plan Note (Signed)
No signs of structural abnormalities of the pelvis.   She has ongoing symptoms over the past 3 months since her trauma.  The x-ray of her lumbar spine was revealing for degenerative disc changes as well as facet changes. -Counseled on home exercise therapy and supportive care. -MRI lumbar spine to evaluate nerve impingement

## 2019-12-11 NOTE — Progress Notes (Signed)
Virtual Visit via Video Note  I connected with Jasmine Bray on 12/11/19 at  2:10 PM EDT by a video enabled telemedicine application and verified that I am speaking with the correct person using two identifiers.   I discussed the limitations of evaluation and management by telemedicine and the availability of in person appointments. The patient expressed understanding and agreed to proceed.  Patient: work  Physician: office   History of Present Illness:  Ms. Jasmine Bray is a 69 year old female that is following up after the MRI of her pelvis.  She sustained a fall 2 to 3 months ago and landed on her pelvis.  The MRI of her pelvis was reassuring for no fracture or significant structural abnormality.  She is still having pain down the left leg.  It has improved with treatment thus far but is still occurring and bothersome.   Observations/Objective:  Gen: NAD, alert, cooperative with exam, well-appearing  Assessment and Plan:  Sciatica of left side: No signs of structural abnormalities of the pelvis.   She has ongoing symptoms over the past 3 months since her trauma.  The x-ray of her lumbar spine was revealing for degenerative disc changes as well as facet changes. -Counseled on home exercise therapy and supportive care. -MRI lumbar spine to evaluate nerve impingement  Follow Up Instructions:    I discussed the assessment and treatment plan with the patient. The patient was provided an opportunity to ask questions and all were answered. The patient agreed with the plan and demonstrated an understanding of the instructions.   The patient was advised to call back or seek an in-person evaluation if the symptoms worsen or if the condition fails to improve as anticipated.    Clearance Coots, MD

## 2019-12-14 ENCOUNTER — Other Ambulatory Visit: Payer: Self-pay

## 2019-12-14 ENCOUNTER — Ambulatory Visit
Admission: RE | Admit: 2019-12-14 | Discharge: 2019-12-14 | Disposition: A | Discharge status: Home / Self Care | Payer: PPO | Source: Ambulatory Visit | Attending: Family Medicine | Admitting: Family Medicine

## 2019-12-14 ENCOUNTER — Telehealth: Payer: PPO

## 2019-12-14 DIAGNOSIS — G959 Disease of spinal cord, unspecified: Secondary | ICD-10-CM

## 2019-12-14 DIAGNOSIS — M48061 Spinal stenosis, lumbar region without neurogenic claudication: Secondary | ICD-10-CM | POA: Diagnosis not present

## 2019-12-14 NOTE — Chronic Care Management (AMB) (Deleted)
Chronic Care Management Pharmacy  Name: ROCQUEL ASKREN  MRN: [id number] DOB: [birth year]   Chief Complaint/ HPI  Francine Graven,  69 y.o. , female presents for their Initial CCM visit with the clinical pharmacist via telephone due to COVID-19 Pandemic.  PCP : Binnie Rail, MD Patient Care Team: Binnie Rail, MD as PCP - General (Internal Medicine) Rosemarie Ax, MD as Consulting Physician (Family Medicine) Chucky May, MD as Consulting Physician (Psychiatry) Charlton Haws, [facility] as Pharmacist (Pharmacist)  Their chronic conditions include: Hypertension, Hyperlipidemia, Depression and Osteoarthritis   Office Visits: 09/06/19 Dr Quay Burow OV: URI sx, wants to taper of Lexapro. Tapered over a week and continued bupropion, may consider taping that in the future. Tx cold with OTC meds.  Consult Visit: 12/10/19 Dr Raeford Razor VV (Sports med): sciatica of L side, counseled home exercise and ordered MRI lumbar spine to evaluate.  12/02/19 Urgent care: cold/flu symptoms. COVID negative.  11/19/19 Dr Jacob Moores (sports med): SI joint injection, consider MRI.  No Known Allergies  Medications: Outpatient Encounter Medications as of 12/14/2019  Medication Sig Note  . ALPRAZolam (XANAX) 1 MG tablet Take 1 mg by mouth as needed. 01/10/2012: Dr Toy Care  . aspirin EC 81 MG tablet Take 81 mg by mouth daily.   . Biotin w/ Vitamins C & E (HAIR/SKIN/NAILS PO) Take by mouth.   Marland Kitchen buPROPion (WELLBUTRIN XL) 150 MG 24 hr tablet Take 150 mg by mouth daily. 01/10/2013: Dr Toy Care  . Cholecalciferol (VITAMIN D3) 3000 UNITS TABS Take 1 capsule by mouth daily. Taking 1000 units daily   . estradiol (ESTRACE) 0.1 MG/GM vaginal cream Place 2 g vaginally. 1 GRAM 3x A WEEK  01/10/2013: Sherolyn Buba ,Gyn NP  . estradiol (EVAMIST) 1.53 MG/SPRAY transdermal spray Evamist 1.53 mg/spray (1.7 %) transdermal spray  APPLY 1 SPRAY DAILY AS DIRECTED   . gabapentin (NEURONTIN) 100 MG capsule Take 1 capsule (100 mg  total) by mouth 3 (three) times daily.   . hydrochlorothiazide (HYDRODIURIL) 25 MG tablet Take 1 tablet (25 mg total) by mouth daily.   . Multiple Vitamin (MULTIVITAMIN) tablet Take 1 tablet by mouth daily.   . Omega-3 Fatty Acids (FISH OIL) 1000 MG CAPS Take by mouth daily.   . Probiotic Product (PROBIOTIC PEARLS PO) Take 1 tablet by mouth daily.   . simvastatin (ZOCOR) 40 MG tablet Take 1 tablet (40 mg total) by mouth at bedtime.   . Turmeric 500 MG CAPS Take 500 mg by mouth daily.   . valACYclovir (VALTREX) 1000 MG tablet Take 1,000 mg by mouth. 1/2 by mouth once daily 01/10/2013: Sherolyn Buba , Gyn NP 1/2 pill /day  . [DISCONTINUED] escitalopram (LEXAPRO) 20 MG tablet Take 10 mg by mouth daily.  (Patient not taking: Reported on 10/16/2019) 01/10/2012: Dr Toy Care, Psych   No facility-administered encounter medications on file as of 12/14/2019.    Wt Readings from Last 3 Encounters:  11/19/19 180 lb (81.6 kg)  11/07/19 183 lb (83 kg)  10/29/19 183 lb (83 kg)    Current Diagnosis/Assessment:    Goals Addressed   None     Hypertension   BP goal is:  <130/80  Office blood pressures are  BP Readings from Last 3 Encounters:  12/02/19 135/79  11/07/19 (!) 153/87  10/29/19 (!) 158/92   Kidney Function Lab Results  Component Value Date/Time   CREATININE 0.61 02/20/2019 02:50 PM   CREATININE 0.67 01/25/2018 02:19 PM   GFR 97.24 02/20/2019 02:50 PM  GFRNONAA 80.11 12/22/2009 12:00 AM   K 3.9 02/20/2019 02:50 PM   K 4.0 01/25/2018 02:19 PM   Patient checks BP at home {CHL HP BP Monitoring Frequency:[phone]} Patient home BP readings are ranging: ***  Patient has failed these meds in the past: metoprolol, losartan Patient is currently {CHL Controlled/[phone]} on the following medications:  . HCTZ 25 mg daily  We discussed {CHL HP Upstream Pharmacy discussion:[phone]}  Plan  Continue {CHL HP Upstream Pharmacy [phone]}   Hyperlipidemia    LDL goal < 100  Lipid Panel     Component Value Date/Time   CHOL 156 02/20/2019 1450   TRIG 125.0 02/20/2019 1450   HDL 50.60 02/20/2019 1450   LDLCALC 81 02/20/2019 1450   LDLDIRECT 113.0 01/20/2015 1013    Hepatic Function Latest Ref Rng & Units 02/20/2019 01/25/2018 01/24/2017  Total Protein 6.0 - 8.3 g/dL 7.1 7.0 7.0  Albumin 3.5 - 5.2 g/dL 4.0 4.3 4.3  AST 0 - 37 U/L 28 19 38(H)  ALT 0 - 35 U/L 28 18 39(H)  Alk Phosphatase 39 - 117 U/L 70 53 62  Total Bilirubin 0.2 - 1.2 mg/dL 0.4 0.4 0.5  Bilirubin, Direct 0.0 - 0.3 mg/dL - - -    The 10-year ASCVD risk score Mikey Bussing DC Jr., et al., 2013) is: 11.8%   Values used to calculate the score:     Age: 60 years     Sex: Female     Is Non-Hispanic African American: No     Diabetic: No     Tobacco smoker: No     Systolic Blood Pressure: 097 mmHg     Is BP treated: Yes     HDL Cholesterol: 50.6 mg/dL     Total Cholesterol: 156 mg/dL   Patient has failed these meds in past: *** Patient is currently {CHL Controlled/[phone]} on the following medications:  . Simvastatin 40 mg HS . Aspirin 81 mg daily  We discussed:  {CHL HP Upstream Pharmacy discussion:[phone]}  Plan  Continue {CHL HP Upstream Pharmacy DZHGD:[id number]}  Depression / PTSD   Depression screen [facility] 2/9 12/13/2018 11/01/2017 10/21/2016  Decreased Interest 0 0 1  Down, Depressed, Hopeless 0 0 1  PHQ - 2 Score 0 0 2  Altered sleeping 0 1 1  Tired, decreased energy 0 0 0  Change in appetite 0 0 0  Feeling bad or failure about yourself  0 0 0  Trouble concentrating 0 0 0  Moving slowly or fidgety/restless 0 0 0  Suicidal thoughts 0 0 0  PHQ-9 Score 0 1 3  Difficult doing work/chores Not difficult at all Not difficult at all Not difficult at all   Patient has failed these meds in past: escitalopram Patient is currently {CHL Controlled/[phone]} on the following medications:  . Bupropion XL 150 mg daily . Alprazolam 1 mg  PRN  We discussed:  ***  Plan  Continue {CHL HP Upstream Pharmacy [phone]}  Chronic pain   Osteoarthritis of knee Chronic hip pain s/p fall Sciatica of L side  Patient has failed these meds in past: *** Patient is currently {CHL Controlled/[phone]} on the following medications:  . Gabapentin 100 mg TID . Turmeric 500 mg daily . Meloxicam 7.5 mg daily  We discussed:  ***  Plan  Continue {CHL HP Upstream Pharmacy QQIWL:[id number]}  Postmenopause   Patient has failed these meds in past: *** Patient is currently {CHL Controlled/[phone]} on the following medications:  . Estradiol 0.1 mg/gm vaginal cream 3 times a  week . Estradiol 1.53 mg/spray transdermal spray  We discussed:  ***  Plan  Continue {CHL HP Upstream Pharmacy [phone]}  Health Maintenance   Patient is currently {CHL Controlled/[phone]} on the following medications:  . Hair/skin/nails supplement . Multivitamin . Vitamin D 1000 IU daily . Probiotic . Valacyclovir 1000 mg PRN  We discussed:  ***  Plan  Continue {CHL HP Upstream Pharmacy GYJEH:[id number]}  Medication Management   Pt uses [facility] mail order pharmacy for all medications Uses pill box? {Yes or If no, why not?:20788} Pt endorses ***% compliance  We discussed: ***  Plan  {US Pharmacy ZCHY:85027}    Follow up: *** month phone visit  ***

## 2019-12-16 ENCOUNTER — Encounter: Payer: Self-pay | Admitting: Family Medicine

## 2019-12-17 ENCOUNTER — Telehealth: Payer: Self-pay | Admitting: Family Medicine

## 2019-12-17 ENCOUNTER — Other Ambulatory Visit: Payer: Self-pay

## 2019-12-17 ENCOUNTER — Telehealth (INDEPENDENT_AMBULATORY_CARE_PROVIDER_SITE_OTHER): Payer: PPO | Admitting: Family Medicine

## 2019-12-17 DIAGNOSIS — M5432 Sciatica, left side: Secondary | ICD-10-CM | POA: Diagnosis not present

## 2019-12-17 NOTE — Progress Notes (Signed)
Virtual Visit via Telephone Note  I connected with Jasmine Bray on 12/17/19 at  4:10 PM EDT by telephone and verified that I am speaking with the correct person using two identifiers.   I discussed the limitations, risks, security and privacy concerns of performing an evaluation and management service by telephone and the availability of in person appointments. I also discussed with the patient that there may be a patient responsible charge related to this service. The patient expressed understanding and agreed to proceed.  Patient: home  Physician: office   History of Present Illness:  Jasmine Bray is a 69 year old female is following up after the MRI of her lumbar spine.  She has different areas of disc degeneration and disc bulging.  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  Her symptoms only occurred after the fall.   Observations/Objective:   Assessment and Plan:  Sciatic left side: Symptoms occurred only after her fall.  Imaging has been negative for occult fracture.  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 -Counseled supportive care. -Epidural. -Could consider physical therapy.  Follow Up Instructions:    I discussed the assessment and treatment plan with the patient. The patient was provided an opportunity to ask questions and all were answered. The patient agreed with the plan and demonstrated an understanding of the instructions.   The patient was advised to call back or seek an in-person evaluation if the symptoms worsen or if the condition fails to improve as anticipated.  I provided 8 minutes of non-face-to-face time during this encounter.   Clearance Coots, MD

## 2019-12-17 NOTE — Telephone Encounter (Signed)
Pt cb in middle of message to schedule Virtual OV--glh

## 2019-12-18 ENCOUNTER — Ambulatory Visit: Payer: PPO | Admitting: Pharmacist

## 2019-12-18 DIAGNOSIS — M159 Polyosteoarthritis, unspecified: Secondary | ICD-10-CM

## 2019-12-18 DIAGNOSIS — E782 Mixed hyperlipidemia: Secondary | ICD-10-CM

## 2019-12-18 DIAGNOSIS — I10 Essential (primary) hypertension: Secondary | ICD-10-CM

## 2019-12-18 NOTE — Chronic Care Management (AMB) (Signed)
Chronic Care Management Pharmacy  Name: Jasmine Bray  MRN: [id number] DOB: 07/13/1950   Chief Complaint/ HPI  Jasmine Bray,  69 y.o. , female presents for their Initial CCM visit with the clinical pharmacist via telephone due to COVID-19 Pandemic.  PCP : Binnie Rail, MD Patient Care Team: Binnie Rail, MD as PCP - General (Internal Medicine) Rosemarie Ax, MD as Consulting Physician (Family Medicine) Chucky May, MD as Consulting Physician (Psychiatry) Charlton Haws, 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 She lives alone so cannot retire. Owns 4 acres and has 16 animals - chickens, roosters, dogs, cats. Waiting to set up back injection.  Office Visits: 09/06/19 Dr Quay Burow OV: URI sx, wants to taper of Lexapro. Tapered over a week and continued bupropion, may consider taping that in the future. Tx cold with OTC meds.  Consult Visit: 12/10/19 Dr Raeford Razor VV (Sports med): sciatica of L side, counseled home exercise and ordered MRI lumbar spine to evaluate.  12/02/19 Urgent care: cold/flu symptoms. COVID negative.  11/19/19 Dr Jacob Moores (sports med): SI joint injection, consider MRI.  No Known Allergies  Medications: Outpatient Encounter Medications as of 12/18/2019  Medication Sig Note  . ALPRAZolam (XANAX) 1 MG tablet Take 1 mg by mouth as needed. 01/10/2012: Dr Toy Care  . aspirin EC 81 MG tablet Take 81 mg by mouth daily.   . Biotin w/ Vitamins C & E (HAIR/SKIN/NAILS PO) Take by mouth.   Marland Kitchen buPROPion (WELLBUTRIN XL) 150 MG 24 hr tablet Take 150 mg by mouth daily. 01/10/2013: Dr Toy Care  . Cholecalciferol (VITAMIN D3) 3000 UNITS TABS Take 1 capsule by mouth daily. Taking 1000 units daily   . estradiol (ESTRACE) 0.1 MG/GM vaginal cream Place 2 g vaginally. 1 GRAM 3x A WEEK  01/10/2013: Sherolyn Buba ,Gyn NP  .  estradiol (EVAMIST) 1.53 MG/SPRAY transdermal spray Evamist 1.53 mg/spray (1.7 %) transdermal spray  APPLY 1 SPRAY DAILY AS DIRECTED   . gabapentin (NEURONTIN) 100 MG capsule Take 1 capsule (100 mg total) by mouth 3 (three) times daily.   . hydrochlorothiazide (HYDRODIURIL) 25 MG tablet Take 1 tablet (25 mg total) by mouth daily.   . Multiple Vitamin (MULTIVITAMIN) tablet Take 1 tablet by mouth daily.   . Omega-3 Fatty Acids (FISH OIL) 1000 MG CAPS Take by mouth daily.   . Probiotic Product (PROBIOTIC PEARLS PO) Take 1 tablet by mouth daily.   . simvastatin (ZOCOR) 40 MG tablet Take 1 tablet (40 mg total) by mouth at bedtime.   . Turmeric 500 MG CAPS Take 500 mg by mouth daily.   . valACYclovir (VALTREX) 1000 MG tablet Take 1,000 mg by mouth as needed. 1/2 by mouth once daily 01/10/2013: Sherolyn Buba , Gyn NP 1/2 pill /day  . [DISCONTINUED] escitalopram (LEXAPRO) 20 MG tablet Take 10 mg by mouth daily.  (Patient not taking: Reported on 10/16/2019) 01/10/2012: Dr Toy Care, Psych   No facility-administered encounter medications on file as of 12/18/2019.    Wt Readings from Last 3 Encounters:  11/19/19 180 lb (81.6 kg)  11/07/19 183 lb (83 kg)  10/29/19 183 lb (83 kg)    Current Diagnosis/Assessment:  SDOH Interventions     Most Recent Value  SDOH Interventions  Financial Strain Interventions Intervention Not Indicated      Goals Addressed            This  Visit's Progress   . Pharmacy Care Plan       CARE PLAN ENTRY (see longitudinal plan of care for additional care plan information)  Current Barriers:  . Chronic Disease Management support, education, and care coordination needs related to Hypertension, Hyperlipidemia, and Osteoarthritis   Hypertension BP Readings from Last 3 Encounters:  12/02/19 135/79  11/07/19 (!) 153/87  10/29/19 (!) 158/92 .  Pharmacist Clinical Goal(s): o Over the next 180 days, patient will work with PharmD and providers to achieve BP goal  <130/80 . Current regimen:  o HCTZ 25 mg daily . Interventions: o Discussed BP goals and benefits of medications for prevention of heart attack / stroke . Patient self care activities - Over the next 180 days, patient will: o Check BP weekly, document, and provide at future appointments o Ensure daily salt intake < 2300 mg/day  Hyperlipidemia Lab Results  Component Value Date/Time   LDLCALC 81 02/20/2019 02:50 PM   LDLDIRECT 113.0 01/20/2015 10:13 AM .  Pharmacist Clinical Goal(s): o Over the next 180 days, patient will work with PharmD and providers to maintain LDL goal < 100 . Current regimen:  o Simvastatin 40 mg at bedtime o Aspirin 81 mg daily . Interventions: o Discussed cholesterol goals and benefits of medications for prevention of heart attack / stroke o Discussed lack of benefit with aspirin for healthy adults over 70 . Patient self care activities - Over the next 180 days, patient will: o Continue simvastatin as prescribed o Consider stopping aspirin due to lack of benefit  Chronic pain . Pharmacist Clinical Goal(s) o Over the next 180 days, patient will work with PharmD and providers to optimize therapy . Current regimen:  o Gabapentin 100 mg 3 times daily o Turmeric 500 mg daily o Ibuprofen 200 mg twice a day . Interventions: o Discussed benefits and risks of medications . Patient self care activities - Over the next 180 days, patient will: o Continue current medications as prescribed  Medication management . Pharmacist Clinical Goal(s): o Over the next 180 days, patient will work with PharmD and providers to maintain optimal medication adherence . Current pharmacy: [facility] order . Interventions o Comprehensive medication review performed. o Continue current medication management strategy . Patient self care activities - Over the next 180 days, patient will: o Focus on medication adherence by fill date o Take medications as prescribed o Report any  questions or concerns to PharmD and/or provider(s)  Initial goal documentation       Hypertension   BP goal is:  <130/80  Office blood pressures are  BP Readings from Last 3 Encounters:  12/02/19 135/79  11/07/19 (!) 153/87  10/29/19 (!) 158/92   Kidney Function Lab Results  Component Value Date/Time   CREATININE 0.61 02/20/2019 02:50 PM   CREATININE 0.67 01/25/2018 02:19 PM   GFR 97.24 02/20/2019 02:50 PM   GFRNONAA 80.11 12/22/2009 12:00 AM   K 3.9 02/20/2019 02:50 PM   K 4.0 01/25/2018 02:19 PM   Patient checks BP at home 1-2x per week Patient home BP readings are ranging: "under control"  Patient has failed these meds in the past: metoprolol, losartan Patient is currently controlled on the following medications:  . HCTZ 25 mg daily  We discussed diet and exercise extensively; pt is worried about potassium, discussed K level has been in goal range over last several years, advised not to change diet drastically.   Plan  Continue current medications   Hyperlipidemia   LDL goal <  100  Lipid Panel     Component Value Date/Time   CHOL 156 02/20/2019 1450   TRIG 125.0 02/20/2019 1450   HDL 50.60 02/20/2019 1450   LDLCALC 81 02/20/2019 1450   LDLDIRECT 113.0 01/20/2015 1013    Hepatic Function Latest Ref Rng & Units 02/20/2019 01/25/2018 01/24/2017  Total Protein 6.0 - 8.3 g/dL 7.1 7.0 7.0  Albumin 3.5 - 5.2 g/dL 4.0 4.3 4.3  AST 0 - 37 U/L 28 19 38(H)  ALT 0 - 35 U/L 28 18 39(H)  Alk Phosphatase 39 - 117 U/L 70 53 62  Total Bilirubin 0.2 - 1.2 mg/dL 0.4 0.4 0.5  Bilirubin, Direct 0.0 - 0.3 mg/dL - - -    The 10-year ASCVD risk score Mikey Bussing DC Jr., et al., 2013) is: 11.8%   Values used to calculate the score:     Age: 1 years     Sex: Female     Is Non-Hispanic African American: No     Diabetic: No     Tobacco smoker: No     Systolic Blood Pressure: 741 mmHg     Is BP treated: Yes     HDL Cholesterol: 50.6 mg/dL     Total Cholesterol: 156 mg/dL    Patient has failed these meds in past: n/a Patient is currently controlled on the following medications:  . Simvastatin 40 mg HS . Aspirin 81 mg daily . OTC fish oil, flaxseed oil  We discussed:  diet and exercise extensively; calls herself a "clean eater"; she does drink wine most nights; discussed lack of benefit of aspirin in healthy adults over age 22 - pt reports she does bleed "easily" when she cuts herself; advised to consider stopping aspirin for primary prevention of heart disease  Plan  Continue current medications and control with diet and exercise May stop aspirin due to lack of benefit  Depression / PTSD   Depression screen [facility] 2/9 12/13/2018 11/01/2017 10/21/2016  Decreased Interest 0 0 1  Down, Depressed, Hopeless 0 0 1  PHQ - 2 Score 0 0 2  Altered sleeping 0 1 1  Tired, decreased energy 0 0 0  Change in appetite 0 0 0  Feeling bad or failure about yourself  0 0 0  Trouble concentrating 0 0 0  Moving slowly or fidgety/restless 0 0 0  Suicidal thoughts 0 0 0  PHQ-9 Score 0 1 3  Difficult doing work/chores Not difficult at all Not difficult at all Not difficult at all   Patient has failed these meds in past: escitalopram Patient is currently controlled on the following medications:  . Bupropion XL 150 mg daily . Alprazolam 1 mg PRN  We discussed:  She has tapered off of escitalopram, and is planning to taper of alprazolam over 6-9 months. She plans to continue bupropion while tapering alprazolam.  Plan  Continue current medications  Chronic pain   Osteoarthritis of knee Chronic hip pain s/p fall Sciatica of L side  Patient has failed these meds in past: tramadol Patient is currently controlled on the following medications:  . Gabapentin 100 mg TID . Turmeric 500 mg - 2 tab daily . Meloxicam 7.5 mg daily - not taking . Ibuprofen 200 mg BID  We discussed:  Pt is not using meloxicam regularly. She takes Ibuprofen twice a day. Pt repots gabapentin is a  "wonder drug". Turmeric helps with arthritis/swelling in hands.  Plan  Continue current medications  Postmenopause   Patient has failed these meds in  past: n/a Patient is currently controlled on the following medications:  . Estradiol 0.1 mg/gm vaginal cream 1-2 times weekly . Estradiol 1.53 mg/spray transdermal spray - 1 pump daily  We discussed:  Pt follows with gynecologist, discussed that benefits outweigh risks at this point.   Plan  Continue current medications  Health Maintenance   Patient is currently controlled on the following medications:  Marland Kitchen Multivitamin (Alive) . Hair/skin/nails supplement . Vitamin D 1000 IU daily . Probiotic daily . Docusate 100 mg daily . Valacyclovir 1000 mg PRN  We discussed: Patient is satisfied with current OTC regimen and denies issues  Plan  Continue current medications  Medication Management   Pt uses [facility] mail order pharmacy for all medications Uses pill box? No - prefers bottles Pt endorses 100% compliance  We discussed: Discussed benefits of medication synchronization, packaging and delivery as well as enhanced pharmacist oversight with [facility]. Pt is interested however she is happy with mail order services currently.   Plan  Continue current medication management strategy    Follow up: 6 month phone visit  Charlene Brooke, PharmD, [facility] Clinical Pharmacist [facility] [phone]

## 2019-12-18 NOTE — Assessment & Plan Note (Signed)
Symptoms occurred only after her fall.  Imaging has been negative for occult fracture.  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 -Counseled supportive care. -Epidural. -Could consider physical therapy.

## 2019-12-18 NOTE — Addendum Note (Signed)
Addended by: Hinda Kehr on: 12/18/2019 11:25 AM   Modules accepted: Orders

## 2019-12-18 NOTE — Patient Instructions (Addendum)
Visit Information  Phone number for Pharmacist: [phone]  Thank you for meeting with me to discuss your medications! I look forward to working with you to achieve your health care goals. Below is a summary of what we talked about during the visit:  Goals Addressed            This Visit's Progress   . Pharmacy Care Plan       CARE PLAN ENTRY (see longitudinal plan of care for additional care plan information)  Current Barriers:  . Chronic Disease Management support, education, and care coordination needs related to Hypertension, Hyperlipidemia, and Osteoarthritis   Hypertension BP Readings from Last 3 Encounters:  12/02/19 135/79  11/07/19 (!) 153/87  10/29/19 (!) 158/92 .  Pharmacist Clinical Goal(s): o Over the next 180 days, patient will work with PharmD and providers to achieve BP goal <130/80 . Current regimen:  o HCTZ 25 mg daily . Interventions: o Discussed BP goals and benefits of medications for prevention of heart attack / stroke . Patient self care activities - Over the next 180 days, patient will: o Check BP weekly, document, and provide at future appointments o Ensure daily salt intake < 2300 mg/day  Hyperlipidemia Lab Results  Component Value Date/Time   LDLCALC 81 02/20/2019 02:50 PM   LDLDIRECT 113.0 01/20/2015 10:13 AM .  Pharmacist Clinical Goal(s): o Over the next 180 days, patient will work with PharmD and providers to maintain LDL goal < 100 . Current regimen:  o Simvastatin 40 mg at bedtime o Aspirin 81 mg daily . Interventions: o Discussed cholesterol goals and benefits of medications for prevention of heart attack / stroke o Discussed lack of benefit with aspirin for healthy adults over 70 . Patient self care activities - Over the next 180 days, patient will: o Continue simvastatin as prescribed o Consider stopping aspirin due to lack of benefit  Chronic pain . Pharmacist Clinical Goal(s) o Over the next 180 days, patient will work with  PharmD and providers to optimize therapy . Current regimen:  o Gabapentin 100 mg 3 times daily o Turmeric 500 mg daily o Ibuprofen 200 mg twice a day . Interventions: o Discussed benefits and risks of medications . Patient self care activities - Over the next 180 days, patient will: o Continue current medications as prescribed  Medication management . Pharmacist Clinical Goal(s): o Over the next 180 days, patient will work with PharmD and providers to maintain optimal medication adherence . Current pharmacy: [facility] order . Interventions o Comprehensive medication review performed. o Continue current medication management strategy . Patient self care activities - Over the next 180 days, patient will: o Focus on medication adherence by fill date o Take medications as prescribed o Report any questions or concerns to PharmD and/or provider(s)  Initial goal documentation      Ms. Eastland was given information about Chronic Care Management services today including:  1. CCM service includes personalized support from designated clinical staff supervised by her physician, including individualized plan of care and coordination with other care providers 2. 24/7 contact phone numbers for assistance for urgent and routine care needs. 3. Standard insurance, coinsurance, copays and deductibles apply for chronic care management only during months in which we provide at least 20 minutes of these services. Most insurances cover these services at 100%, however patients may be responsible for any copay, coinsurance and/or deductible if applicable. This service may help you avoid the need for more expensive face-to-face services. 4. Only one  practitioner may furnish and bill the service in a calendar month. 5. The patient may stop CCM services at any time (effective at the end of the month) by phone call to the office staff.  Patient agreed to services and verbal consent obtained.   Patient  verbalizes understanding of instructions provided today.  Telephone follow up appointment with pharmacy team member scheduled for: 6 months  Charlene Brooke, PharmD, BCACP Clinical Pharmacist [facility] [phone]  Foods High in Magnesium Hypomagnesemia is a condition in which the level of magnesium in the blood is low. Magnesium is a mineral that is found in many foods. It is used in many different processes in the body. Hypomagnesemia can affect every organ in the body. In severe cases, it can cause life-threatening problems. What are the causes? This condition may be caused by:  Not getting enough magnesium in your diet.  Malnutrition.  Problems with absorbing magnesium from the intestines.  Dehydration.  Alcohol abuse.  Vomiting.  Severe or chronic diarrhea.  Some medicines, including medicines that make you urinate more (diuretics).  Certain diseases, such as kidney disease, diabetes, celiac disease, and overactive thyroid. What are the signs or symptoms? Symptoms of this condition include:  Loss of appetite.  Nausea and vomiting.  Involuntary shaking or trembling of a body part (tremor).  Muscle weakness.  Tingling in the arms and legs.  Sudden tightening of muscles (muscle spasms).  Confusion.  Psychiatric issues, such as depression, irritability, or psychosis.  A feeling of fluttering of the heart.  Seizures. These symptoms are more severe if magnesium levels drop suddenly. How is this diagnosed? This condition may be diagnosed based on:  Your symptoms and medical history.  A physical exam.  Blood and urine tests. How is this treated? Treatment depends on the cause and the severity of the condition. It may be treated with:  A magnesium supplement. This can be taken in pill form. If the condition is severe, magnesium is usually given through an IV.  Changes to your diet. You may be directed to eat foods that have a  lot of magnesium, such as green leafy vegetables, peas, beans, and nuts.  Stopping any intake of alcohol. Follow these instructions at home:      Make sure that your diet includes foods with magnesium. Foods that have a lot of magnesium in them include: ? Green leafy vegetables, such as spinach and broccoli. ? Beans and peas. ? Nuts and seeds, such as almonds and sunflower seeds. ? Whole grains, such as whole grain bread and fortified cereals.  Take magnesium supplements if your health care provider tells you to do that. Take them as directed.  Take over-the-counter and prescription medicines only as told by your health care provider.  Have your magnesium levels monitored as told by your health care provider.  When you are active, drink fluids that contain electrolytes.  Avoid drinking alcohol.  Keep all follow-up visits as told by your health care provider. This is important. Contact a health care provider if:  You get worse instead of better.  Your symptoms return. Get help right away if you:  Develop severe muscle weakness.  Have trouble breathing.  Feel that your heart is racing. Summary  Hypomagnesemia is a condition in which the level of magnesium in the blood is low.  Hypomagnesemia can affect every organ in the body.  Treatment may include eating more foods that contain magnesium, taking magnesium supplements, and not drinking alcohol.  Have your magnesium levels monitored as told by your health care provider. This information is not intended to replace advice given to you by your health care provider. Make sure you discuss any questions you have with your health care provider. Document Revised: 02/25/2017 Document Reviewed: 02/14/2017 Elsevier Patient Education  2020 [employer].

## 2019-12-19 ENCOUNTER — Ambulatory Visit: Payer: Self-pay

## 2019-12-20 ENCOUNTER — Ambulatory Visit
Admission: RE | Admit: 2019-12-20 | Discharge: 2019-12-20 | Disposition: A | Discharge status: Home / Self Care | Payer: PPO | Source: Ambulatory Visit | Attending: Family Medicine | Admitting: Family Medicine

## 2019-12-20 DIAGNOSIS — M5432 Sciatica, left side: Secondary | ICD-10-CM

## 2019-12-20 DIAGNOSIS — M48061 Spinal stenosis, lumbar region without neurogenic claudication: Secondary | ICD-10-CM | POA: Diagnosis not present

## 2019-12-20 MED ORDER — IOPAMIDOL (ISOVUE-M 200) INJECTION 41%
1.0000 mL | Freq: Once | INTRAMUSCULAR | Status: AC
  Administered 2019-12-20: 1 mL via EPIDURAL

## 2019-12-20 MED ORDER — METHYLPREDNISOLONE ACETATE 40 MG/ML INJ SUSP (RADIOLOG
120.0000 mg | Freq: Once | INTRAMUSCULAR | Status: AC
  Administered 2019-12-20: 120 mg via EPIDURAL

## 2019-12-20 NOTE — Discharge Instructions (Signed)

## 2019-12-25 ENCOUNTER — Other Ambulatory Visit: Payer: PPO

## 2020-01-03 ENCOUNTER — Ambulatory Visit: Payer: Self-pay

## 2020-01-07 ENCOUNTER — Encounter: Payer: Self-pay | Admitting: Family Medicine

## 2020-01-08 ENCOUNTER — Other Ambulatory Visit: Payer: Self-pay | Admitting: Family Medicine

## 2020-01-08 DIAGNOSIS — L578 Other skin changes due to chronic exposure to nonionizing radiation: Secondary | ICD-10-CM | POA: Diagnosis not present

## 2020-01-08 DIAGNOSIS — D2221 Melanocytic nevi of right ear and external auricular canal: Secondary | ICD-10-CM | POA: Diagnosis not present

## 2020-01-08 DIAGNOSIS — Z86006 Personal history of melanoma in-situ: Secondary | ICD-10-CM | POA: Diagnosis not present

## 2020-01-08 DIAGNOSIS — L570 Actinic keratosis: Secondary | ICD-10-CM | POA: Diagnosis not present

## 2020-01-08 DIAGNOSIS — D485 Neoplasm of uncertain behavior of skin: Secondary | ICD-10-CM | POA: Diagnosis not present

## 2020-01-08 DIAGNOSIS — M5432 Sciatica, left side: Secondary | ICD-10-CM

## 2020-01-08 DIAGNOSIS — L821 Other seborrheic keratosis: Secondary | ICD-10-CM | POA: Diagnosis not present

## 2020-01-08 DIAGNOSIS — L814 Other melanin hyperpigmentation: Secondary | ICD-10-CM | POA: Diagnosis not present

## 2020-01-08 NOTE — Progress Notes (Signed)
Placed epidural.   Rosemarie Ax, MD [facility] 01/08/2020, 8:27 AM

## 2020-01-16 ENCOUNTER — Ambulatory Visit
Admission: RE | Admit: 2020-01-16 | Discharge: 2020-01-16 | Disposition: A | Discharge status: Home / Self Care | Payer: PPO | Source: Ambulatory Visit | Attending: Family Medicine | Admitting: Family Medicine

## 2020-01-16 DIAGNOSIS — M5432 Sciatica, left side: Secondary | ICD-10-CM

## 2020-01-16 DIAGNOSIS — M47817 Spondylosis without myelopathy or radiculopathy, lumbosacral region: Secondary | ICD-10-CM | POA: Diagnosis not present

## 2020-01-16 MED ORDER — METHYLPREDNISOLONE ACETATE 40 MG/ML INJ SUSP (RADIOLOG
120.0000 mg | Freq: Once | INTRAMUSCULAR | Status: AC
  Administered 2020-01-16: 120 mg via EPIDURAL

## 2020-01-16 MED ORDER — IOPAMIDOL (ISOVUE-M 200) INJECTION 41%
1.0000 mL | Freq: Once | INTRAMUSCULAR | Status: AC
  Administered 2020-01-16: 1 mL via EPIDURAL

## 2020-01-16 NOTE — Discharge Instructions (Signed)

## 2020-01-21 ENCOUNTER — Ambulatory Visit (INDEPENDENT_AMBULATORY_CARE_PROVIDER_SITE_OTHER): Payer: PPO

## 2020-01-21 DIAGNOSIS — Z0000 Encounter for general adult medical examination without abnormal findings: Secondary | ICD-10-CM

## 2020-01-21 NOTE — Progress Notes (Signed)
I connected with Jasmine Bray today by telephone and verified that I am speaking with the correct person using two identifiers. Location patient: home Location provider: work Persons participating in the virtual visit: Jasmine Bray and [employer]. Gumaro Brightbill, LPN.   I discussed the limitations, risks, security and privacy concerns of performing an evaluation and management service by telephone and the availability of in person appointments. I also discussed with the patient that there may be a patient responsible charge related to this service. The patient expressed understanding and verbally consented to this telephonic visit.    Interactive audio and video telecommunications were attempted between this provider and patient, however failed, due to patient having technical difficulties OR patient did not have access to video capability.  We continued and completed visit with audio only.  Some vital signs may be absent or patient reported.   Time Spent with patient on telephone encounter: 20 minutes  Subjective:   Jasmine Bray is a 69 y.o. female who presents for Medicare Annual (Subsequent) preventive examination.  Review of Systems    No ROS. Medicare Wellness Visit Cardiac Risk Factors include: advanced age (>64men, >25 women);dyslipidemia;family history of premature cardiovascular disease;hypertension     Objective:    Today's Vitals   01/21/20 1452  PainSc: 2    There is no height or weight on file to calculate BMI.  Advanced Directives 01/21/2020 12/13/2018 11/01/2017 10/21/2016 09/11/2013  Does Patient Have a Medical Advance Directive? No No No No Patient does not have advance directive  Does patient want to make changes to medical advance directive? - No - Patient declined - - -  Would patient like information on creating a medical advance directive? No - Patient declined - Yes (ED - Information included in AVS) Yes (ED - Information included in AVS) -  Pre-existing out of  facility DNR order (yellow form or pink MOST form) - - - - No    Current Medications (verified) Outpatient Encounter Medications as of 01/21/2020  Medication Sig  . ALPRAZolam (XANAX) 1 MG tablet Take 1 mg by mouth as needed.  Marland Kitchen aspirin EC 81 MG tablet Take 81 mg by mouth daily.  . Biotin w/ Vitamins C & E (HAIR/SKIN/NAILS PO) Take by mouth.  Marland Kitchen buPROPion (WELLBUTRIN XL) 150 MG 24 hr tablet Take 150 mg by mouth daily.  . Cholecalciferol (VITAMIN D3) 3000 UNITS TABS Take 1 capsule by mouth daily. Taking 1000 units daily  . estradiol (ESTRACE) 0.1 MG/GM vaginal cream Place 2 g vaginally. 1 GRAM 3x A WEEK   . estradiol (EVAMIST) 1.53 MG/SPRAY transdermal spray Evamist 1.53 mg/spray (1.7 %) transdermal spray  APPLY 1 SPRAY DAILY AS DIRECTED  . gabapentin (NEURONTIN) 100 MG capsule Take 1 capsule (100 mg total) by mouth 3 (three) times daily.  . hydrochlorothiazide (HYDRODIURIL) 25 MG tablet Take 1 tablet (25 mg total) by mouth daily.  . Multiple Vitamin (MULTIVITAMIN) tablet Take 1 tablet by mouth daily.  . Omega-3 Fatty Acids (FISH OIL) 1000 MG CAPS Take by mouth daily.  . Probiotic Product (PROBIOTIC PEARLS PO) Take 1 tablet by mouth daily.  . simvastatin (ZOCOR) 40 MG tablet Take 1 tablet (40 mg total) by mouth at bedtime.  . Turmeric 500 MG CAPS Take 500 mg by mouth daily.  . valACYclovir (VALTREX) 1000 MG tablet Take 1,000 mg by mouth as needed. 1/2 by mouth once daily  . [DISCONTINUED] escitalopram (LEXAPRO) 20 MG tablet Take 10 mg by mouth daily.  (Patient not  taking: Reported on 10/16/2019)   No facility-administered encounter medications on file as of 01/21/2020.    Allergies (verified) Patient has no known allergies.   History: Past Medical History:  Diagnosis Date  . Allergy    RHINITIS  . Anxiety 1989   Dr Jake Michaelis, [place]  . Back injury   . Depression    Dr Toy Care  . Fibroids   . GERD 11/11/2009   Qualifier: Diagnosis of  By: Linna Darner MD, Gwyndolyn Saxon   Dysphagia once monthly on  average 4-10/14 ; resolved with Prilosec OTC prn No PMH of endoscopy    . GERD (gastroesophageal reflux disease)   . Hyperlipidemia   . Hypertension    Past Surgical History:  Procedure Laterality Date  . ABDOMINAL HYSTERECTOMY     & USO  . ANAL FISTULA ECTOMY      X 2, Dr Rosana Hoes  . COLONOSCOPY  2004, 2015   Carlean Purl  . INJECTION KNEE Bilateral    [facility]  . MYOMECTOMY     FOR FIBROIDS, Dr Ubaldo Glassing  . steroid injection shoulder Right 05/11/2013   contusion/strain; Dr Onnie Graham   Family History  Problem Relation Age of Onset  . Arthritis Mother   . Stroke Mother 13  . Alcohol abuse Father   . Hyperlipidemia Sister   . Hypertension Sister   . Atrial fibrillation Sister   . Breast cancer Paternal Aunt   . Diabetes Maternal Grandmother   . Heart disease Paternal Grandmother        CAD; no MI  . Stroke Paternal Grandmother        in 55s  . Stroke Paternal Grandfather        in late 15s  . Colon cancer Neg Hx    Social History   Socioeconomic History  . Marital status: Divorced    Spouse name: Not on file  . Number of children: Not on file  . Years of education: Not on file  . Highest education level: Not on file  Occupational History  . Occupation: Press photographer  Tobacco Use  . Smoking status: Never Smoker  . Smokeless tobacco: Never Used  Vaping Use  . Vaping Use: Never used  Substance and Sexual Activity  . Alcohol use: Yes    Comment: decreasing wine intake  . Drug use: No  . Sexual activity: Not on file  Other Topics Concern  . Not on file  Social History Narrative   GETS REG EXERCISE         Social Determinants of Health   Financial Resource Strain: Low Risk   . Difficulty of Paying Living Expenses: Not hard at all  Food Insecurity: No Food Insecurity  . Worried About Charity fundraiser in the Last Year: Never true  . Ran Out of Food in the Last Year: Never true  Transportation Needs: No Transportation Needs  . Lack of Transportation (Medical): No  . Lack of  Transportation (Non-Medical): No  Physical Activity: Sufficiently Active  . Days of Exercise per Week: 7 days  . Minutes of Exercise per Session: 60 min  Stress: No Stress Concern Present  . Feeling of Stress : Not at all  Social Connections: Unknown  . Frequency of Communication with Friends and Family: More than three times a week  . Frequency of Social Gatherings with Friends and Family: Once a week  . Attends Religious Services: Patient refused  . Active Member of Clubs or Organizations: Patient refused  . Attends Archivist Meetings: Patient  refused  . Marital Status: Never married    Tobacco Counseling Counseling given: Not Answered   Clinical Intake:  Pre-visit preparation completed: Yes  Pain : 0-10 Pain Score: 2  Pain Type: Chronic pain Pain Location: Back Pain Orientation: Mid, Lower, Upper Pain Radiating Towards: none; diagnosed with spinal stenosis due to fall Pain Onset: More than a month ago Pain Frequency: Constant Pain Relieving Factors: Pain Medication Effect of Pain on Daily Activities: Pain produces disability and affects the quality of life.  Pain Relieving Factors: Pain Medication  Nutritional Risks: None Diabetes: No  How often do you need to have someone help you when you read instructions, pamphlets, or other written materials from your doctor or pharmacy?: 1 - Never What is the last grade level you completed in school?: Some College; owns a 4 acre farm  Diabetic? no  Interpreter Needed?: No  Information entered by :: Lisette Abu, LPN   Activities of Daily Living In your present state of health, do you have any difficulty performing the following activities: 01/21/2020  Hearing? N  Vision? N  Difficulty concentrating or making decisions? N  Walking or climbing stairs? N  Dressing or bathing? N  Doing errands, shopping? N  Preparing Food and eating ? N  Using the Toilet? N  In the past six months, have you accidently  leaked urine? N  Do you have problems with loss of bowel control? N  Managing your Medications? N  Managing your Finances? N  Housekeeping or managing your Housekeeping? N  Some recent data might be hidden    Patient Care Team: Binnie Rail, MD as PCP - General (Internal Medicine) Rosemarie Ax, MD as Consulting Physician (Family Medicine) Chucky May, MD as Consulting Physician (Psychiatry) Charlton Haws, [facility] as Pharmacist (Pharmacist)  Indicate any recent Medical Services you may have received from other than [facility] providers in the past year (date may be approximate).     Assessment:   This is a routine wellness examination for Sh[place].  Hearing/Vision screen No exam data present  Dietary issues and exercise activities discussed: Current Exercise Habits: The patient does not participate in regular exercise at present, Exercise limited by: orthopedic condition(s)  Goals    . Patient Stated     Ride my horses every weekend and go horse camping more often. Continue to eat healthy, exercise, swim, enjoy life and family.    Marland Kitchen Pharmacy Care Plan     CARE PLAN ENTRY (see longitudinal plan of care for additional care plan information)  Current Barriers:  . Chronic Disease Management support, education, and care coordination needs related to Hypertension, Hyperlipidemia, and Osteoarthritis   Hypertension BP Readings from Last 3 Encounters:  12/02/19 135/79  11/07/19 (!) 153/87  10/29/19 (!) 158/92 .  Pharmacist Clinical Goal(s): o Over the next 180 days, patient will work with PharmD and providers to achieve BP goal <130/80 . Current regimen:  o HCTZ 25 mg daily . Interventions: o Discussed BP goals and benefits of medications for prevention of heart attack / stroke . Patient self care activities - Over the next 180 days, patient will: o Check BP weekly, document, and provide at future appointments o Ensure daily salt intake < 2300  mg/day  Hyperlipidemia Lab Results  Component Value Date/Time   LDLCALC 81 02/20/2019 02:50 PM   LDLDIRECT 113.0 01/20/2015 10:13 AM .  Pharmacist Clinical Goal(s): o Over the next 180 days, patient will work with PharmD and providers to maintain LDL goal <  100 . Current regimen:  o Simvastatin 40 mg at bedtime o Aspirin 81 mg daily . Interventions: o Discussed cholesterol goals and benefits of medications for prevention of heart attack / stroke o Discussed lack of benefit with aspirin for healthy adults over 70 . Patient self care activities - Over the next 180 days, patient will: o Continue simvastatin as prescribed o Consider stopping aspirin due to lack of benefit  Chronic pain . Pharmacist Clinical Goal(s) o Over the next 180 days, patient will work with PharmD and providers to optimize therapy . Current regimen:  o Gabapentin 100 mg 3 times daily o Turmeric 500 mg daily o Ibuprofen 200 mg twice a day . Interventions: o Discussed benefits and risks of medications . Patient self care activities - Over the next 180 days, patient will: o Continue current medications as prescribed  Medication management . Pharmacist Clinical Goal(s): o Over the next 180 days, patient will work with PharmD and providers to maintain optimal medication adherence . Current pharmacy: [facility] order . Interventions o Comprehensive medication review performed. o Continue current medication management strategy . Patient self care activities - Over the next 180 days, patient will: o Focus on medication adherence by fill date o Take medications as prescribed o Report any questions or concerns to PharmD and/or provider(s)  Initial goal documentation    . Stay as healthy as possible      Continue to eat healthy, exercise, and enjoy life      Depression Screen PHQ 2/9 Scores 01/21/2020 12/13/2018 11/01/2017 10/21/2016 01/22/2016 01/10/2013  PHQ - 2 Score 0 0 0 2 0 0  PHQ- 9 Score - 0 1 3 - -     Fall Risk Fall Risk  01/21/2020 02/20/2019 12/13/2018 11/01/2017 10/24/2017  Falls in the past year? 1 0 0 No No  Number falls in past yr: 0 0 0 - -  Injury with Fall? 1 - 0 - -  Risk for fall due to : Other (Comment) - - - -  Risk for fall due to: Comment accidently triped and fell on her back - - - -  Follow up Falls evaluation completed - - - -    Any stairs in or around the home? No  If so, are there any without handrails? No  Home free of loose throw rugs in walkways, pet beds, electrical cords, etc? Yes  Adequate lighting in your home to reduce risk of falls? Yes   ASSISTIVE DEVICES UTILIZED TO PREVENT FALLS:  Life alert? No  Use of a cane, walker or w/c? No  Grab bars in the bathroom? No  Shower chair or bench in shower? No  Elevated toilet seat or a handicapped toilet? No   TIMED UP AND GO:  Was the test performed? No .  Length of time to ambulate 10 feet: 0 sec.   Gait steady and fast without use of assistive device  Cognitive Function: Patient is cogitatively intact.        Immunizations Immunization History  Administered Date(s) Administered  . Influenza Split 02/02/2011, 01/10/2012  . Influenza Whole 12/22/2009  . Influenza, High Dose Seasonal PF 01/22/2016, 01/24/2017, 01/25/2018, 01/25/2019  . Influenza,inj,Quad PF,6+ Mos 01/17/2014  . PFIZER SARS-COV-2 Vaccination 05/13/2019, 06/05/2019  . Pneumococcal Conjugate-13 03/08/2016  . Pneumococcal Polysaccharide-23 02/20/2019  . Td 11/11/2009  . Zoster 01/20/2015    TDAP status: Due, Education has been provided regarding the importance of this vaccine. Advised may receive this vaccine at local pharmacy or  Health Dept. Aware to provide a copy of the vaccination record if obtained from local pharmacy or Health Dept. Verbalized acceptance and understanding. Flu Vaccine status: Up to date Pneumococcal vaccine status: Up to date Covid-19 vaccine status: Completed vaccines  Qualifies for Shingles Vaccine? Yes    Zostavax completed Yes   Shingrix Completed?: No.    Education has been provided regarding the importance of this vaccine. Patient has been advised to call insurance company to determine out of pocket expense if they have not yet received this vaccine. Advised may also receive vaccine at local pharmacy or Health Dept. Verbalized acceptance and understanding.  Screening Tests Health Maintenance  Topic Date Due  . INFLUENZA VACCINE  10/28/2019  . TETANUS/TDAP  11/12/2019  . MAMMOGRAM  12/14/2019  . DEXA SCAN  12/02/2022  . COLONOSCOPY  09/26/2023  . COVID-19 Vaccine  Completed  . Hepatitis C Screening  Completed  . PNA vac Low Risk Adult  Completed    Health Maintenance  Health Maintenance Due  Topic Date Due  . INFLUENZA VACCINE  10/28/2019  . TETANUS/TDAP  11/12/2019  . MAMMOGRAM  12/14/2019    Colorectal cancer screening: Completed 09/25/2013. Repeat every 10 years Mammogram status: Completed 01/24/2020. Repeat every year Bone Density status: Completed 12/01/2017. Results reflect: Bone density results: NORMAL. Repeat every 2-3 years.  Lung Cancer Screening: (Low Dose CT Chest recommended if Age 7-80 years, 30 pack-year currently smoking OR have quit w/in 15years.) does not qualify.   Lung Cancer Screening Referral: no  Additional Screening:  Hepatitis C Screening: does not qualify; Completed no  Vision Screening: Recommended annual ophthalmology exams for early detection of glaucoma and other disorders of the eye. Is the patient up to date with their annual eye exam?  Yes  Who is the provider or what is the name of the office in which the patient attends annual eye exams? [facility] If pt is not established with a provider, would they like to be referred to a provider to establish care? No .   Dental Screening: Recommended annual dental exams for proper oral hygiene  Community Resource Referral / Chronic Care Management: CRR required this visit?  No    CCM required this visit?  No      Plan:     I have personally reviewed and noted the following in the patient's chart:   . Medical and social history . Use of alcohol, tobacco or illicit drugs  . Current medications and supplements . Functional ability and status . Nutritional status . Physical activity . Advanced directives . List of other physicians . Hospitalizations, surgeries, and ER visits in previous 12 months . Vitals . Screenings to include cognitive, depression, and falls . Referrals and appointments  In addition, I have reviewed and discussed with patient certain preventive protocols, quality metrics, and best practice recommendations. A written personalized care plan for preventive services as well as general preventive health recommendations were provided to patient.     Sheral Flow, LPN   [id number]   Nurse Notes:  Patient is cogitatively intact. There were no vitals filed for this visit. There is no height or weight on file to calculate BMI. Patient stated that she has no issues with gait or balance; does not use any assistive devices.

## 2020-01-21 NOTE — Patient Instructions (Addendum)
Jasmine Bray , Thank you for taking time to come for your Medicare Wellness Visit. I appreciate your ongoing commitment to your health goals. Please review the following plan we discussed and let me know if I can assist you in the future.   Screening recommendations/referrals: Colonoscopy: 09/25/2013; due every 10 years Mammogram: scheduled for 01/24/2020 Bone Density: 12/01/2017; will schedule for 2022 Recommended yearly ophthalmology/optometry visit for glaucoma screening and checkup Recommended yearly dental visit for hygiene and checkup  Vaccinations: Influenza vaccine: 01/11/2020 Pneumococcal vaccine: up to date Tdap vaccine: 11/11/2009; overdue, will check with local pharmacy Shingles vaccine: never done   Covid-19: up to date with booster  Advanced directives: Advance directive discussed with you today. Even though you declined this today please call our office should you change your mind and we can give you the proper paperwork for you to fill out.  Conditions/risks identified: Yes; Reviewed health maintenance screenings with patient today and relevant education, vaccines, and/or referrals were provided. Please continue to do your personal lifestyle choices by: daily care of teeth and gums, regular physical activity (goal should be 5 days a week for 30 minutes), eat a healthy diet, avoid tobacco and drug use, limiting any alcohol intake, taking a low-dose aspirin (if not allergic or have been advised by your provider otherwise) and taking vitamins and minerals as recommended by your provider. Continue doing brain stimulating activities (puzzles, reading, adult coloring books, staying active) to keep memory sharp. Continue to eat heart healthy diet (full of fruits, vegetables, whole grains, lean protein, water--limit salt, fat, and sugar intake) and increase physical activity as tolerated.  Next appointment: Please schedule your next Medicare Wellness Visit with your Nurse Health Advisor in 1  year by calling [phone].  Preventive Care 70 Years and Older, Female Preventive care refers to lifestyle choices and visits with your health care provider that can promote health and wellness. What does preventive care include?  A yearly physical exam. This is also called an annual well check.  Dental exams once or twice a year.  Routine eye exams. Ask your health care provider how often you should have your eyes checked.  Personal lifestyle choices, including:  Daily care of your teeth and gums.  Regular physical activity.  Eating a healthy diet.  Avoiding tobacco and drug use.  Limiting alcohol use.  Practicing safe sex.  Taking low-dose aspirin every day.  Taking vitamin and mineral supplements as recommended by your health care provider. What happens during an annual well check? The services and screenings done by your health care provider during your annual well check will depend on your age, overall health, lifestyle risk factors, and family history of disease. Counseling  Your health care provider may ask you questions about your:  Alcohol use.  Tobacco use.  Drug use.  Emotional well-being.  Home and relationship well-being.  Sexual activity.  Eating habits.  History of falls.  Memory and ability to understand (cognition).  Work and work Statistician.  Reproductive health. Screening  You may have the following tests or measurements:  Height, weight, and BMI.  Blood pressure.  Lipid and cholesterol levels. These may be checked every 5 years, or more frequently if you are over 69 years old.  Skin check.  Lung cancer screening. You may have this screening every year starting at age 45 if you have a 30-pack-year history of smoking and currently smoke or have quit within the past 15 years.  Fecal occult blood test (FOBT) of the stool.  You may have this test every year starting at age 35.  Flexible sigmoidoscopy or colonoscopy. You may have  a sigmoidoscopy every 5 years or a colonoscopy every 10 years starting at age 9.  Hepatitis C blood test.  Hepatitis B blood test.  Sexually transmitted disease (STD) testing.  Diabetes screening. This is done by checking your blood sugar (glucose) after you have not eaten for a while (fasting). You may have this done every 1-3 years.  Bone density scan. This is done to screen for osteoporosis. You may have this done starting at age 25.  Mammogram. This may be done every 1-2 years. Talk to your health care provider about how often you should have regular mammograms. Talk with your health care provider about your test results, treatment options, and if necessary, the need for more tests. Vaccines  Your health care provider may recommend certain vaccines, such as:  Influenza vaccine. This is recommended every year.  Tetanus, diphtheria, and acellular pertussis (Tdap, Td) vaccine. You may need a Td booster every 10 years.  Zoster vaccine. You may need this after age 40.  Pneumococcal 13-valent conjugate (PCV13) vaccine. One dose is recommended after age 37.  Pneumococcal polysaccharide (PPSV23) vaccine. One dose is recommended after age 42. Talk to your health care provider about which screenings and vaccines you need and how often you need them. This information is not intended to replace advice given to you by your health care provider. Make sure you discuss any questions you have with your health care provider. Document Released: 04/11/2015 Document Revised: 12/03/2015 Document Reviewed: 01/14/2015 Elsevier Interactive Patient Education  2017 [place] Prevention in the Home Falls can cause injuries. They can happen to people of all ages. There are many things you can do to make your home safe and to help prevent falls. What can I do on the outside of my home?  Regularly fix the edges of walkways and driveways and fix any cracks.  Remove anything that might make you  trip as you walk through a door, such as a raised step or threshold.  Trim any bushes or trees on the path to your home.  Use bright outdoor lighting.  Clear any walking paths of anything that might make someone trip, such as rocks or tools.  Regularly check to see if handrails are loose or broken. Make sure that both sides of any steps have handrails.  Any raised decks and porches should have guardrails on the edges.  Have any leaves, snow, or ice cleared regularly.  Use sand or salt on walking paths during winter.  Clean up any spills in your garage right away. This includes oil or grease spills. What can I do in the bathroom?  Use night lights.  Install grab bars by the toilet and in the tub and shower. Do not use towel bars as grab bars.  Use non-skid mats or decals in the tub or shower.  If you need to sit down in the shower, use a plastic, non-slip stool.  Keep the floor dry. Clean up any water that spills on the floor as soon as it happens.  Remove soap buildup in the tub or shower regularly.  Attach bath mats securely with double-sided non-slip rug tape.  Do not have throw rugs and other things on the floor that can make you trip. What can I do in the bedroom?  Use night lights.  Make sure that you have a light by your bed that  is easy to reach.  Do not use any sheets or blankets that are too big for your bed. They should not hang down onto the floor.  Have a firm chair that has side arms. You can use this for support while you get dressed.  Do not have throw rugs and other things on the floor that can make you trip. What can I do in the kitchen?  Clean up any spills right away.  Avoid walking on wet floors.  Keep items that you use a lot in easy-to-reach places.  If you need to reach something above you, use a strong step stool that has a grab bar.  Keep electrical cords out of the way.  Do not use floor polish or wax that makes floors slippery. If  you must use wax, use non-skid floor wax.  Do not have throw rugs and other things on the floor that can make you trip. What can I do with my stairs?  Do not leave any items on the stairs.  Make sure that there are handrails on both sides of the stairs and use them. Fix handrails that are broken or loose. Make sure that handrails are as long as the stairways.  Check any carpeting to make sure that it is firmly attached to the stairs. Fix any carpet that is loose or worn.  Avoid having throw rugs at the top or bottom of the stairs. If you do have throw rugs, attach them to the floor with carpet tape.  Make sure that you have a light switch at the top of the stairs and the bottom of the stairs. If you do not have them, ask someone to add them for you. What else can I do to help prevent falls?  Wear shoes that:  Do not have high heels.  Have rubber bottoms.  Are comfortable and fit you well.  Are closed at the toe. Do not wear sandals.  If you use a stepladder:  Make sure that it is fully opened. Do not climb a closed stepladder.  Make sure that both sides of the stepladder are locked into place.  Ask someone to hold it for you, if possible.  Clearly mark and make sure that you can see:  Any grab bars or handrails.  First and last steps.  Where the edge of each step is.  Use tools that help you move around (mobility aids) if they are needed. These include:  Canes.  Walkers.  Scooters.  Crutches.  Turn on the lights when you go into a dark area. Replace any light bulbs as soon as they burn out.  Set up your furniture so you have a clear path. Avoid moving your furniture around.  If any of your floors are uneven, fix them.  If there are any pets around you, be aware of where they are.  Review your medicines with your doctor. Some medicines can make you feel dizzy. This can increase your chance of falling. Ask your doctor what other things that you can do to  help prevent falls. This information is not intended to replace advice given to you by your health care provider. Make sure you discuss any questions you have with your health care provider. Document Released: 01/09/2009 Document Revised: 08/21/2015 Document Reviewed: 04/19/2014 Elsevier Interactive Patient Education  2017 [employer].

## 2020-01-23 ENCOUNTER — Other Ambulatory Visit: Payer: Self-pay | Admitting: Family Medicine

## 2020-01-23 DIAGNOSIS — M5432 Sciatica, left side: Secondary | ICD-10-CM

## 2020-01-23 NOTE — Progress Notes (Signed)
Placed referral to neurosurgery.   Rosemarie Ax, MD [facility] 01/23/2020, 5:30 PM

## 2020-02-24 NOTE — Progress Notes (Signed)
Subjective:    Patient ID: Jasmine Bray, female    DOB: 23-Jun-1950, 69 y.o.   MRN: [id number]   This visit occurred during the SARS-CoV-2 public health emergency.  Safety protocols were in place, including screening questions prior to the visit, additional usage of staff PPE, and extensive cleaning of exam room while observing appropriate contact time as indicated for disinfecting solutions.    HPI She is here for a physical exam.   Her back pain has resolved with the epidural injections.   Her BP has been controlled over the past several months at other doctor's offices.   Medications and allergies reviewed with patient and updated if appropriate.  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     aspirin EC 81 MG tablet Take 81 mg by mouth daily.     Biotin w/ Vitamins C & E (HAIR/SKIN/NAILS PO) Take by mouth.     buPROPion (WELLBUTRIN XL) 150 MG 24 hr tablet Take 150 mg by mouth daily.     Cholecalciferol (VITAMIN D3) 3000 UNITS TABS Take 1 capsule by mouth daily. Taking 1000 units daily     estradiol (ESTRACE) 0.1 MG/GM vaginal cream Place 2 g vaginally. 1 GRAM 3x A WEEK      estradiol (EVAMIST) 1.53  MG/SPRAY transdermal spray Evamist 1.53 mg/spray (1.7 %) transdermal spray  APPLY 1 SPRAY DAILY AS DIRECTED     gabapentin (NEURONTIN) 100 MG capsule Take 1 capsule (100 mg total) by mouth 3 (three) times daily. 90 capsule 3   hydrochlorothiazide (HYDRODIURIL) 25 MG tablet Take 1 tablet (25 mg total) by mouth daily. 90 tablet 3   LORazepam (ATIVAN) 1 MG tablet Take 1 mg by mouth daily as needed.     Multiple Vitamin (MULTIVITAMIN) tablet Take 1 tablet by mouth daily.     Omega-3 Fatty Acids (FISH OIL) 1000 MG CAPS Take by mouth daily.     Probiotic Product (PROBIOTIC PEARLS PO) Take 1 tablet by mouth daily.     simvastatin (ZOCOR) 40 MG tablet Take 1 tablet (40 mg total) by mouth at bedtime. 90 tablet 3   Turmeric 500 MG CAPS Take 500 mg by mouth daily.     valACYclovir (VALTREX) 1000 MG tablet Take 1,000 mg by mouth as needed. 1/2 by mouth once daily     [DISCONTINUED] escitalopram (LEXAPRO) 20 MG tablet Take 10 mg by mouth daily.  (Patient not taking: Reported on 10/16/2019)     No current facility-administered medications on file prior to visit.    Past Medical History:  Diagnosis Date   Allergy    RHINITIS   Anxiety 1989   Dr Jake Michaelis, FP   Back injury    Depression  Dr Toy Care   Fibroids    GERD 11/11/2009   Qualifier: Diagnosis of  By: Linna Darner MD, Gwyndolyn Saxon   Dysphagia once monthly on average 4-10/14 ; resolved with Prilosec OTC prn No PMH of endoscopy     GERD (gastroesophageal reflux disease)    Hyperlipidemia    Hypertension     Past Surgical History:  Procedure Laterality Date   ABDOMINAL HYSTERECTOMY     & USO   ANAL FISTULA ECTOMY      X 2, Dr Rosana Hoes   COLONOSCOPY  2004, 2015   [facility]   INJECTION KNEE Bilateral    [facility]   MYOMECTOMY     FOR FIBROIDS, Dr Ubaldo Glassing   steroid injection shoulder Right 05/11/2013   contusion/strain; Dr Onnie Graham    Social History   Socioeconomic History   Marital status: Divorced    Spouse name: Not on file    Number of children: Not on file   Years of education: Not on file   Highest education level: Not on file  Occupational History   Occupation: SALES  Tobacco Use   Smoking status: Never Smoker   Smokeless tobacco: Never Used  Scientific laboratory technician Use: Never used  Substance and Sexual Activity   Alcohol use: Yes    Comment: decreasing wine intake   Drug use: No   Sexual activity: Not on file  Other Topics Concern   Not on file  Social History Narrative   GETS REG EXERCISE         Social Determinants of Health   Financial Resource Strain: Low Risk    Difficulty of Paying Living Expenses: Not hard at all  Food Insecurity: No Food Insecurity   Worried About Charity fundraiser in the Last Year: Never true   [place] in the Last Year: Never true  Transportation Needs: No Transportation Needs   Lack of Transportation (Medical): No   Lack of Transportation (Non-Medical): No  Physical Activity: Sufficiently Active   Days of Exercise per Week: 7 days   Minutes of Exercise per Session: 60 min  Stress: No Stress Concern Present   Feeling of Stress : Not at all  Social Connections: Unknown   Frequency of Communication with Friends and Family: More than three times a week   Frequency of Social Gatherings with Friends and Family: Once a week   Attends Religious Services: Patient refused   Marine scientist or Organizations: Patient refused   Attends Music therapist: Patient refused   Marital Status: Never married    Family History  Problem Relation Age of Onset   Arthritis Mother    Stroke Mother 15   Alcohol abuse Father    Hyperlipidemia Sister    Hypertension Sister    Atrial fibrillation Sister    Breast cancer Paternal Aunt    Diabetes Maternal Grandmother    Heart disease Paternal Grandmother        CAD; no MI   Stroke Paternal Grandmother        in 72s   Stroke Paternal Grandfather        in late 68s    Colon cancer Neg Hx     Review of Systems  Constitutional: Negative for chills and fever.  Eyes: Negative for visual disturbance.  Respiratory: Negative for cough, shortness of breath and wheezing.   Cardiovascular: Negative for chest pain, palpitations and leg swelling.  Gastrointestinal: Negative for abdominal pain, blood in stool, constipation, diarrhea  and nausea.       Rare gerd  Genitourinary: Negative for dysuria and hematuria.  Musculoskeletal: Positive for arthralgias. Negative for back pain.  Skin: Negative for color change and rash.  Neurological: Negative for light-headedness and headaches.  Psychiatric/Behavioral: Positive for dysphoric mood. The patient is nervous/anxious.        Objective:   Vitals:   02/25/20 1334  BP: 140/84  Pulse: 75  Temp: 98.6 F (37 C)  SpO2: 95%   Filed Weights   02/25/20 1334  Weight: 184 lb 6.4 oz (83.6 kg)   Body mass index is 28.88 kg/m.  BP Readings from Last 3 Encounters:  02/25/20 140/84  01/16/20 (!) 177/86  12/20/19 (!) 145/68    Wt Readings from Last 3 Encounters:  02/25/20 184 lb 6.4 oz (83.6 kg)  11/19/19 180 lb (81.6 kg)  11/07/19 183 lb (83 kg)     Physical Exam Constitutional: She appears well-developed and well-nourished. No distress.  HENT:  Head: Normocephalic and atraumatic.  Right Ear: External ear normal. Normal ear canal and TM Left Ear: External ear normal.  Normal ear canal and TM Mouth/Throat: Oropharynx is clear and moist.  Eyes: Conjunctivae and EOM are normal.  Neck: Neck supple. No tracheal deviation present. No thyromegaly present.  No carotid bruit  Cardiovascular: Normal rate, regular rhythm and normal heart sounds.   No murmur heard.  No edema. Pulmonary/Chest: Effort normal and breath sounds normal. No respiratory distress. She has no wheezes. She has no rales.  Breast: deferred   Abdominal: Soft. She exhibits no distension. There is no tenderness.  Lymphadenopathy: She has no  cervical adenopathy.  Skin: Skin is warm and dry. She is not diaphoretic.  Psychiatric: She has a normal mood and affect. Her behavior is normal.        Assessment & Plan:   Physical exam: Screening blood work    ordered Immunizations   Discussed tdap and shingrix Colonoscopy  Up to date  Mammogram  Scheduled 03/19/20 Gyn  Up to date - [place]  Up to date  Eye exams  Up to date  Exercise  none Weight  Working on weight loss Substance abuse  none Sees derm annually     See Problem List for Assessment and Plan of chronic medical problems.

## 2020-02-24 NOTE — Patient Instructions (Addendum)
Blood work was ordered.     No immunization administered today.   Medications changes include :   none   Your prescription(s) have been submitted to your pharmacy.    Please followup in 1 year    Health Maintenance, Female Adopting a healthy lifestyle and getting preventive care are important in promoting health and wellness. Ask your health care provider about:  The right schedule for you to have regular tests and exams.  Things you can do on your own to prevent diseases and keep yourself healthy. What should I know about diet, weight, and exercise? Eat a healthy diet   Eat a diet that includes plenty of vegetables, fruits, low-fat dairy products, and lean protein.  Do not eat a lot of foods that are high in solid fats, added sugars, or sodium. Maintain a healthy weight Body mass index (BMI) is used to identify weight problems. It estimates body fat based on height and weight. Your health care provider can help determine your BMI and help you achieve or maintain a healthy weight. Get regular exercise Get regular exercise. This is one of the most important things you can do for your health. Most adults should:  Exercise for at least 150 minutes each week. The exercise should increase your heart rate and make you sweat (moderate-intensity exercise).  Do strengthening exercises at least twice a week. This is in addition to the moderate-intensity exercise.  Spend less time sitting. Even light physical activity can be beneficial. Watch cholesterol and blood lipids Have your blood tested for lipids and cholesterol at 71 years of age, then have this test every 5 years. Have your cholesterol levels checked more often if:  Your lipid or cholesterol levels are high.  You are older than 70 years of age.  You are at high risk for heart disease. What should I know about cancer screening? Depending on your health history and family history, you may need to have cancer screening  at various ages. This may include screening for:  Breast cancer.  Cervical cancer.  Colorectal cancer.  Skin cancer.  Lung cancer. What should I know about heart disease, diabetes, and high blood pressure? Blood pressure and heart disease  High blood pressure causes heart disease and increases the risk of stroke. This is more likely to develop in people who have high blood pressure readings, are of African descent, or are overweight.  Have your blood pressure checked: ? Every 3-5 years if you are 30-68 years of age. ? Every year if you are 36 years old or older. Diabetes Have regular diabetes screenings. This checks your fasting blood sugar level. Have the screening done:  Once every three years after age 34 if you are at a normal weight and have a low risk for diabetes.  More often and at a younger age if you are overweight or have a high risk for diabetes. What should I know about preventing infection? Hepatitis B If you have a higher risk for hepatitis B, you should be screened for this virus. Talk with your health care provider to find out if you are at risk for hepatitis B infection. Hepatitis C Testing is recommended for:  Everyone born from [birth year] through 1966.  Anyone with known risk factors for hepatitis C. Sexually transmitted infections (STIs)  Get screened for STIs, including gonorrhea and chlamydia, if: ? You are sexually active and are younger than 70 years of age. ? You are older than 70 years of age and your  health care provider tells you that you are at risk for this type of infection. ? Your sexual activity has changed since you were last screened, and you are at increased risk for chlamydia or gonorrhea. Ask your health care provider if you are at risk.  Ask your health care provider about whether you are at high risk for HIV. Your health care provider may recommend a prescription medicine to help prevent HIV infection. If you choose to take medicine to  prevent HIV, you should first get tested for HIV. You should then be tested every 3 months for as long as you are taking the medicine. Pregnancy  If you are about to stop having your period (premenopausal) and you may become pregnant, seek counseling before you get pregnant.  Take 400 to 800 micrograms (mcg) of folic acid every day if you become pregnant.  Ask for birth control (contraception) if you want to prevent pregnancy. Osteoporosis and menopause Osteoporosis is a disease in which the bones lose minerals and strength with aging. This can result in bone fractures. If you are 54 years old or older, or if you are at risk for osteoporosis and fractures, ask your health care provider if you should:  Be screened for bone loss.  Take a calcium or vitamin D supplement to lower your risk of fractures.  Be given hormone replacement therapy (HRT) to treat symptoms of menopause. Follow these instructions at home: Lifestyle  Do not use any products that contain nicotine or tobacco, such as cigarettes, e-cigarettes, and chewing tobacco. If you need help quitting, ask your health care provider.  Do not use street drugs.  Do not share needles.  Ask your health care provider for help if you need support or information about quitting drugs. Alcohol use  Do not drink alcohol if: ? Your health care provider tells you not to drink. ? You are pregnant, may be pregnant, or are planning to become pregnant.  If you drink alcohol: ? Limit how much you use to 0-1 drink a day. ? Limit intake if you are breastfeeding.  Be aware of how much alcohol is in your drink. In the [place] one drink equals one 12 oz bottle of beer (355 mL), one 5 oz glass of wine (148 mL), or one 1 oz glass of hard liquor (44 mL). General instructions  Schedule regular health, dental, and eye exams.  Stay current with your vaccines.  Tell your health care provider if: ? You often feel depressed. ? You have ever been  abused or do not feel safe at home. Summary  Adopting a healthy lifestyle and getting preventive care are important in promoting health and wellness.  Follow your health care provider's instructions about healthy diet, exercising, and getting tested or screened for diseases.  Follow your health care provider's instructions on monitoring your cholesterol and blood pressure. This information is not intended to replace advice given to you by your health care provider. Make sure you discuss any questions you have with your health care provider. Document Revised: 03/08/2018 Document Reviewed: 03/08/2018 Elsevier Patient Education  2020 [employer].

## 2020-02-25 ENCOUNTER — Encounter: Payer: Self-pay | Admitting: Internal Medicine

## 2020-02-25 ENCOUNTER — Other Ambulatory Visit: Payer: Self-pay

## 2020-02-25 ENCOUNTER — Telehealth: Payer: Self-pay | Admitting: Internal Medicine

## 2020-02-25 ENCOUNTER — Ambulatory Visit (INDEPENDENT_AMBULATORY_CARE_PROVIDER_SITE_OTHER): Payer: PPO | Admitting: Internal Medicine

## 2020-02-25 VITALS — BP 140/84 | HR 75 | Temp 98.6°F | Ht 67.0 in | Wt 184.4 lb

## 2020-02-25 DIAGNOSIS — I10 Essential (primary) hypertension: Secondary | ICD-10-CM | POA: Diagnosis not present

## 2020-02-25 DIAGNOSIS — F3289 Other specified depressive episodes: Secondary | ICD-10-CM | POA: Diagnosis not present

## 2020-02-25 DIAGNOSIS — Z0000 Encounter for general adult medical examination without abnormal findings: Secondary | ICD-10-CM | POA: Diagnosis not present

## 2020-02-25 DIAGNOSIS — R7301 Impaired fasting glucose: Secondary | ICD-10-CM | POA: Diagnosis not present

## 2020-02-25 DIAGNOSIS — Z85820 Personal history of malignant melanoma of skin: Secondary | ICD-10-CM | POA: Diagnosis not present

## 2020-02-25 DIAGNOSIS — E782 Mixed hyperlipidemia: Secondary | ICD-10-CM

## 2020-02-25 DIAGNOSIS — R768 Other specified abnormal immunological findings in serum: Secondary | ICD-10-CM

## 2020-02-25 LAB — COMPREHENSIVE METABOLIC PANEL
ALT: 25 U/L (ref 0–35)
AST: 23 U/L (ref 0–37)
Albumin: 4.2 g/dL (ref 3.5–5.2)
Alkaline Phosphatase: 57 U/L (ref 39–117)
BUN: 19 mg/dL (ref 6–23)
CO2: 27 mEq/L (ref 19–32)
Calcium: 9.4 mg/dL (ref 8.4–10.5)
Chloride: 99 mEq/L (ref 96–112)
Creatinine, Ser: 0.64 mg/dL (ref 0.40–1.20)
GFR: 89.78 mL/min (ref >60.00)
Glucose, Bld: 95 mg/dL (ref 70–99)
Potassium: 3.8 mEq/L (ref 3.5–5.1)
Sodium: 136 mEq/L (ref 135–145)
Total Bilirubin: 0.4 mg/dL (ref 0.2–1.2)
Total Protein: 7.1 g/dL (ref 6.0–8.3)

## 2020-02-25 LAB — CBC WITH DIFFERENTIAL/PLATELET
Basophils Absolute: 0.1 10*3/uL (ref 0.0–0.1)
Basophils Relative: 0.9 % (ref 0.0–3.0)
Eosinophils Absolute: 0.2 10*3/uL (ref 0.0–0.7)
Eosinophils Relative: 1.9 % (ref 0.0–5.0)
HCT: 38.9 % (ref 36.0–46.0)
Hemoglobin: 13.5 g/dL (ref 12.0–15.0)
Lymphocytes Relative: 27.8 % (ref 12.0–46.0)
Lymphs Abs: 2.7 10*3/uL (ref 0.7–4.0)
MCHC: 34.6 g/dL (ref 30.0–36.0)
MCV: 93.6 fl (ref 78.0–100.0)
Monocytes Absolute: 0.8 10*3/uL (ref 0.1–1.0)
Monocytes Relative: 8 % (ref 3.0–12.0)
Neutro Abs: 5.9 10*3/uL (ref 1.4–7.7)
Neutrophils Relative %: 61.4 % (ref 43.0–77.0)
Platelets: 222 10*3/uL (ref 150.0–400.0)
RBC: 4.16 Mil/uL (ref 3.87–5.11)
RDW: 13.1 % (ref 11.5–15.5)
WBC: 9.7 10*3/uL (ref 4.0–10.5)

## 2020-02-25 LAB — LIPID PANEL
Cholesterol: 215 mg/dL — ABNORMAL HIGH (ref 0–200)
HDL: 73.7 mg/dL (ref >39.00)
LDL Cholesterol: 109 mg/dL — ABNORMAL HIGH (ref 0–99)
NonHDL: 141.23
Total CHOL/HDL Ratio: 3
Triglycerides: 162 mg/dL — ABNORMAL HIGH (ref 0.0–149.0)
VLDL: 32.4 mg/dL (ref 0.0–40.0)

## 2020-02-25 LAB — HEMOGLOBIN A1C: Hgb A1c MFr Bld: 5.6 % (ref 4.6–6.5)

## 2020-02-25 LAB — TSH: TSH: 1.59 u[IU]/mL (ref 0.35–4.50)

## 2020-02-25 MED ORDER — HYDROCHLOROTHIAZIDE 25 MG PO TABS
25.0000 mg | ORAL_TABLET | Freq: Every day | ORAL | 3 refills | Status: DC
Start: 2020-02-25 — End: 2020-02-26

## 2020-02-25 MED ORDER — SIMVASTATIN 40 MG PO TABS
40.0000 mg | ORAL_TABLET | Freq: Every day | ORAL | 3 refills | Status: DC
Start: 2020-02-25 — End: 2020-02-26

## 2020-02-25 NOTE — Assessment & Plan Note (Signed)
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

## 2020-02-25 NOTE — Telephone Encounter (Signed)
  Patient seen today Patient calling to requesting medication be sent to [facility] ([place] [place] [facility] simvastatin (ZOCOR) 40 MG tablet hydrochlorothiazide (HYDRODIURIL) 25 MG tablet LORazepam (ATIVAN) 1 MG tablet

## 2020-02-25 NOTE — Assessment & Plan Note (Signed)
chronic Following with psychiatry

## 2020-02-25 NOTE — Assessment & Plan Note (Signed)
Chronic BP well controlled Continue hctz 25 mg qd cmp  

## 2020-02-25 NOTE — Assessment & Plan Note (Signed)
Chronic Sees dermatology annually

## 2020-02-25 NOTE — Assessment & Plan Note (Signed)
Chronic Check lipid panel  Continue zocor 40 mg daily Regular exercise and healthy diet encouraged  

## 2020-02-25 NOTE — Addendum Note (Signed)
Addended by: Cresenciano Lick on: 02/25/2020 02:24 PM   Modules accepted: Orders

## 2020-02-25 NOTE — Assessment & Plan Note (Signed)
Chronic Check a1c Low sugar / carb diet Stressed regular exercise  

## 2020-02-26 ENCOUNTER — Other Ambulatory Visit: Payer: Self-pay

## 2020-02-26 MED ORDER — HYDROCHLOROTHIAZIDE 25 MG PO TABS
25.0000 mg | ORAL_TABLET | Freq: Every day | ORAL | 3 refills | Status: DC
Start: 2020-02-26 — End: 2021-04-02

## 2020-02-26 MED ORDER — SIMVASTATIN 40 MG PO TABS
40.0000 mg | ORAL_TABLET | Freq: Every day | ORAL | 3 refills | Status: DC
Start: 2020-02-26 — End: 2020-04-01

## 2020-02-26 NOTE — Telephone Encounter (Signed)
Ativan is done by her psychiatrist

## 2020-03-19 DIAGNOSIS — Z79890 Hormone replacement therapy (postmenopausal): Secondary | ICD-10-CM | POA: Diagnosis not present

## 2020-03-19 DIAGNOSIS — Z1231 Encounter for screening mammogram for malignant neoplasm of breast: Secondary | ICD-10-CM | POA: Diagnosis not present

## 2020-03-19 DIAGNOSIS — A6004 Herpesviral vulvovaginitis: Secondary | ICD-10-CM | POA: Diagnosis not present

## 2020-03-19 DIAGNOSIS — Z6829 Body mass index (BMI) 29.0-29.9, adult: Secondary | ICD-10-CM | POA: Diagnosis not present

## 2020-03-19 DIAGNOSIS — Z01419 Encounter for gynecological examination (general) (routine) without abnormal findings: Secondary | ICD-10-CM | POA: Diagnosis not present

## 2020-03-20 DIAGNOSIS — Z6828 Body mass index (BMI) 28.0-28.9, adult: Secondary | ICD-10-CM | POA: Diagnosis not present

## 2020-03-20 DIAGNOSIS — M5416 Radiculopathy, lumbar region: Secondary | ICD-10-CM | POA: Diagnosis not present

## 2020-03-20 DIAGNOSIS — R030 Elevated blood-pressure reading, without diagnosis of hypertension: Secondary | ICD-10-CM | POA: Diagnosis not present

## 2020-03-26 ENCOUNTER — Telehealth: Payer: Self-pay | Admitting: Pharmacist

## 2020-03-26 NOTE — Progress Notes (Addendum)
Chronic Care Management Pharmacy Assistant   Name: Jasmine Bray  MRN: [id number] DOB: 1950/10/13  Reason for Encounter: Medication Review   PCP : Jasmine Sanes, MD  Allergies:  No Known Allergies  Medications: Outpatient Encounter Medications as of 03/26/2020  Medication Sig Note   aspirin EC 81 MG tablet Take 81 mg by mouth daily.    Biotin w/ Vitamins C & E (HAIR/SKIN/NAILS PO) Take by mouth.    buPROPion (WELLBUTRIN XL) 150 MG 24 hr tablet Take 150 mg by mouth daily. 01/10/2013: Dr Jasmine Bray   Cholecalciferol (VITAMIN D3) 3000 UNITS TABS Take 1 capsule by mouth daily. Taking 1000 units daily    estradiol (ESTRACE) 0.1 MG/GM vaginal cream Place 2 g vaginally. 1 GRAM 3x A WEEK  01/10/2013: Jasmine Bray ,Gyn NP   estradiol (EVAMIST) 1.53 MG/SPRAY transdermal spray Evamist 1.53 mg/spray (1.7 %) transdermal spray  APPLY 1 SPRAY DAILY AS DIRECTED    gabapentin (NEURONTIN) 100 MG capsule Take 1 capsule (100 mg total) by mouth 3 (three) times daily.    hydrochlorothiazide (HYDRODIURIL) 25 MG tablet Take 1 tablet (25 mg total) by mouth daily.    LORazepam (ATIVAN) 1 MG tablet Take 1 mg by mouth daily as needed.    Multiple Vitamin (MULTIVITAMIN) tablet Take 1 tablet by mouth daily.    Omega-3 Fatty Acids (FISH OIL) 1000 MG CAPS Take by mouth daily.    Probiotic Product (PROBIOTIC PEARLS PO) Take 1 tablet by mouth daily.    simvastatin (ZOCOR) 40 MG tablet Take 1 tablet (40 mg total) by mouth at bedtime.    Turmeric 500 MG CAPS Take 500 mg by mouth daily.    valACYclovir (VALTREX) 1000 MG tablet Take 1,000 mg by mouth as needed. 1/2 by mouth once daily 01/10/2013: Jasmine Bray , Gyn NP 1/2 pill /day   [DISCONTINUED] escitalopram (LEXAPRO) 20 MG tablet Take 10 mg by mouth daily.  (Patient not taking: Reported on 10/16/2019) 01/10/2012: Dr Jasmine Bray, Psych   No facility-administered encounter medications on file as of 03/26/2020.    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 The patient states that she is doing well except for having some back pain. She did states that she was going to have back surgery on 04/09/20 by Dr. Paulita Bray. She stated that for the last two years she has been having trouble with her back and has been treated with medication but now it is time for the surgery so that she can have some release with the pain. The patient stated that she is ready to get back to the gym so that she can work  out and lose some of the weight she has gained. She also stated that she is working on eating more healthy now especially before she has her surgery since she may be unable to do anything until she recovers from the surgery. The patient stated that she put herself back on Lexapro since she was taken off of the Alprazolam. Over all the patient states that she has no new health issues. I told the patient that I will pass along the information to Jasmine Bray.   Jasmine Bray, Clinical Pharmacist Assistant [facility]  6 minutes spent in review, coordination, and documentation. Jasmine Bray Clinical Pharmacist [facility]  [phone]

## 2020-04-01 ENCOUNTER — Other Ambulatory Visit: Payer: Self-pay

## 2020-04-01 ENCOUNTER — Telehealth: Payer: Self-pay | Admitting: Internal Medicine

## 2020-04-01 MED ORDER — SIMVASTATIN 40 MG PO TABS
40.0000 mg | ORAL_TABLET | Freq: Every day | ORAL | 0 refills | Status: DC
Start: 2020-04-01 — End: 2020-04-23

## 2020-04-01 NOTE — Telephone Encounter (Signed)
Patient is out of medication, requesting short supply until mail order ships Requesting simvastatin (ZOCOR) 40 MG tablet [facility]acy [id number] [place], [place] - [address]

## 2020-04-01 NOTE — Telephone Encounter (Signed)
Sent in today for quantity of #30.

## 2020-04-03 ENCOUNTER — Encounter: Payer: Self-pay | Admitting: Internal Medicine

## 2020-04-03 ENCOUNTER — Encounter: Payer: Self-pay | Admitting: Family Medicine

## 2020-04-15 ENCOUNTER — Other Ambulatory Visit: Payer: Self-pay | Admitting: Family Medicine

## 2020-04-23 ENCOUNTER — Other Ambulatory Visit: Payer: Self-pay | Admitting: Internal Medicine

## 2020-04-28 DIAGNOSIS — M48062 Spinal stenosis, lumbar region with neurogenic claudication: Secondary | ICD-10-CM | POA: Diagnosis not present

## 2020-04-28 DIAGNOSIS — M5416 Radiculopathy, lumbar region: Secondary | ICD-10-CM | POA: Diagnosis not present

## 2020-04-30 ENCOUNTER — Encounter: Payer: Self-pay | Admitting: Family Medicine

## 2020-04-30 ENCOUNTER — Other Ambulatory Visit: Payer: Self-pay | Admitting: Family Medicine

## 2020-04-30 MED ORDER — GABAPENTIN 100 MG PO CAPS
100.0000 mg | ORAL_CAPSULE | Freq: Three times a day (TID) | ORAL | 0 refills | Status: DC
Start: 2020-04-30 — End: 2021-02-25

## 2020-04-30 MED ORDER — BACLOFEN 10 MG PO TABS: 5.0000 mg | ORAL_TABLET | Freq: Two times a day (BID) | ORAL | 0 refills | Status: DC | PRN

## 2020-06-13 ENCOUNTER — Telehealth: Payer: PPO

## 2020-06-13 NOTE — Progress Notes (Deleted)
Chronic Care Management Pharmacy Note  06/13/2020 Name:  Jasmine Bray MRN:  [id number] DOB:  [birth year]  Subjective: Jasmine Bray is an 70 y.o. year old female who is a primary patient of Burns, Claudina Lick, MD.  The CCM team was consulted for assistance with disease management and care coordination needs.    Engaged with patient by telephone for follow up visit in response to provider referral for pharmacy case management and/or care coordination services.   Consent to Services:  The patient was given information about Chronic Care Management services, agreed to services, and gave verbal consent prior to initiation of services.  Please see initial visit note for detailed documentation.   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 She lives alone so cannot retire. Owns 4 acres and has 16 animals - chickens, roosters, dogs, cats. Back surgery 04/25/20  Recent office visits: 02/25/20 Dr Quay Burow OV: CPE, labs stable. No med changes.  Recent consult visits: 04/28/20 Dr Zada Finders (ortho): s/p back surgery 04/25/20. Rx'd Percocet x 5 day supply for post-op pain.  Hospital visits: None in previous 6 months  Objective:  Lab Results  Component Value Date   CREATININE 0.64 02/25/2020   BUN 19 02/25/2020   GFR 89.78 02/25/2020   GFRNONAA 80.11 12/22/2009   NA 136 02/25/2020   K 3.8 02/25/2020   CALCIUM 9.4 02/25/2020   CO2 27 02/25/2020   GLUCOSE 95 02/25/2020    Lab Results  Component Value Date/Time   HGBA1C 5.6 02/25/2020 02:24 PM   HGBA1C 5.6 02/20/2019 02:50 PM   GFR 89.78 02/25/2020 02:24  PM   GFR 97.24 02/20/2019 02:50 PM    Last diabetic Eye exam: No results found for: HMDIABEYEEXA  Last diabetic Foot exam: No results found for: HMDIABFOOTEX   Lab Results  Component Value Date   CHOL 215 (H) 02/25/2020   HDL 73.70 02/25/2020   LDLCALC 109 (H) 02/25/2020   LDLDIRECT 113.0 01/20/2015   TRIG 162.0 (H) 02/25/2020   CHOLHDL 3 02/25/2020    Hepatic Function Latest Ref Rng & Units 02/25/2020 02/20/2019 01/25/2018  Total Protein 6.0 - 8.3 g/dL 7.1 7.1 7.0  Albumin 3.5 - 5.2 g/dL 4.2 4.0 4.3  AST 0 - 37 U/L $Remo'23 28 19  'IAVcL$ ALT 0 - 35 U/L $Remo'25 28 18  'BVRIr$ Alk Phosphatase 39 - 117 U/L 57 70 53  Total Bilirubin 0.2 - 1.2 mg/dL 0.4 0.4 0.4  Bilirubin, Direct 0.0 - 0.3 mg/dL - - -    Lab Results  Component Value Date/Time   TSH 1.59 02/25/2020 02:24 PM   TSH 1.58 02/20/2019 02:50 PM    CBC Latest Ref Rng & Units 02/25/2020 02/20/2019 01/25/2018  WBC 4.0 - 10.5 K/uL 9.7 8.2 6.7  Hemoglobin 12.0 - 15.0 g/dL 13.5 13.4 13.9  Hematocrit 36.0 - 46.0 % 38.9 39.3 40.4  Platelets 150.0 - 400.0 K/uL 222.0 214.0 201.0    No results found for: VD25OH  Clinical ASCVD: No  The 10-year ASCVD risk score Mikey Bussing DC Jr., et al., 2013) is: 15%   Values used to calculate the score:     Age: 44 years     Sex: Female  Is Non-Hispanic African American: No     Diabetic: No     Tobacco smoker: No     Systolic Blood Pressure: 245 mmHg     Is BP treated: Yes     HDL Cholesterol: 73.7 mg/dL     Total Cholesterol: 215 mg/dL    Depression screen [facility] 2/9 01/21/2020 12/13/2018 11/01/2017  Decreased Interest 0 0 0  Down, Depressed, Hopeless 0 0 0  PHQ - 2 Score 0 0 0  Altered sleeping - 0 1  Tired, decreased energy - 0 0  Change in appetite - 0 0  Feeling bad or failure about yourself  - 0 0  Trouble concentrating - 0 0  Moving slowly or fidgety/restless - 0 0  Suicidal thoughts - 0 0  PHQ-9 Score - 0 1  Difficult doing work/chores - Not difficult at all Not difficult at all     Social  History   Tobacco Use  Smoking Status Never Smoker  Smokeless Tobacco Never Used   BP Readings from Last 3 Encounters:  02/25/20 140/84  01/16/20 (!) 177/86  12/20/19 (!) 145/68   Pulse Readings from Last 3 Encounters:  02/25/20 75  01/16/20 (!) 57  12/20/19 (!) 58   Wt Readings from Last 3 Encounters:  02/25/20 184 lb 6.4 oz (83.6 kg)  11/19/19 180 lb (81.6 kg)  11/07/19 183 lb (83 kg)   BMI Readings from Last 3 Encounters:  02/25/20 28.88 kg/m  11/19/19 28.19 kg/m  11/07/19 27.83 kg/m    Assessment/Interventions: Review of patient past medical history, allergies, medications, health status, including review of consultants reports, laboratory and other test data, was performed as part of comprehensive evaluation and provision of chronic care management services.   SDOH:  (Social Determinants of Health) assessments and interventions performed: Yes   CCM Care Plan  No Known Allergies  Medications Reviewed Today    Reviewed by Binnie Rail, MD (Physician) on 02/25/20 at 1357  Med List Status: <None>  Medication Order Taking? Sig Documenting Provider Last Dose Status Informant  ALPRAZolam (XANAX) 1 MG tablet [id number]  Take 1 mg by mouth as needed. [provider]  Active            Med Note Linna Darner Douglass Rivers Jan 10, 2012 10:13 AM) Dr Toy Care  aspirin EC 81 MG tablet [id number]  Take 81 mg by mouth daily. [provider]  Active   Biotin w/ Vitamins C & E (HAIR/SKIN/NAILS PO) [id number]  Take by mouth. [provider]  Active   buPROPion (WELLBUTRIN XL) 150 MG 24 hr tablet [id number]  Take 150 mg by mouth daily. [provider]  Active            Med Note Linna Darner, Berna Bue Jan 10, 2013  9:57 AM) Dr Toy Care  Cholecalciferol (VITAMIN D3) 3000 UNITS TABS [id number]  Take 1 capsule by mouth daily. Taking 1000 units daily [provider]  Active        Patient not taking:      Discontinued 12/02/19 1650            Med Note  (HOPPER, Douglass Rivers Jan 10, 2012 10:13 AM) Dr Toy Care, Psych  estradiol (ESTRACE) 0.1 MG/GM vaginal cream [id number]  Place 2 g vaginally. 1 GRAM 3x A WEEK  [provider]  Active            Med Note (HOPPER, Darrick Penna  Wed Jan 10, 2013  9:58 AM) Sherolyn Buba ,Gyn NP  estradiol (EVAMIST) 1.53 MG/SPRAY transdermal spray [id number]  Evamist 1.53 mg/spray (1.7 %) transdermal spray  APPLY 1 SPRAY DAILY AS DIRECTED [provider]  Active   gabapentin (NEURONTIN) 100 MG capsule [id number]  Take 1 capsule (100 mg total) by mouth 3 (three) times daily. Rosemarie Ax, MD  Active   hydrochlorothiazide (HYDRODIURIL) 25 MG tablet [id number]  Take 1 tablet (25 mg total) by mouth daily. Binnie Rail, MD  Active   LORazepam (ATIVAN) 1 MG tablet [id number]  Take 1 mg by mouth daily as needed. [provider]  Active   Multiple Vitamin (MULTIVITAMIN) tablet [id number]  Take 1 tablet by mouth daily. [provider]  Active   Omega-3 Fatty Acids (FISH OIL) 1000 MG CAPS [id number]  Take by mouth daily. [provider]  Active   Probiotic Product (PROBIOTIC PEARLS PO) [id number]  Take 1 tablet by mouth daily. [provider]  Active   simvastatin (ZOCOR) 40 MG tablet [id number]  Take 1 tablet (40 mg total) by mouth at bedtime. Binnie Rail, MD  Active   Turmeric 500 MG CAPS [id number]  Take 500 mg by mouth daily. [provider]  Active   valACYclovir (VALTREX) 1000 MG tablet [id number]  Take 1,000 mg by mouth as needed. 1/2 by mouth once daily [provider]  Active            Med Note (HOPPER, Berna Bue Jan 10, 2013 10:00 AM) Sherolyn Buba , Gyn NP 1/2 pill /day          Patient Active Problem List   Diagnosis Date Noted  . 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  . 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  . Depression 09/06/2019  . History of melanoma 02/20/2019  . Osteoarthritis 02/20/2019  .  Primary osteoarthritis of right knee 07/18/2018  . Sciatica of left side 10/10/2017  . Chronic knee pain 07/25/2017  . Left upper arm pain 03/17/2016  . Hepatitis C antibody test positive - cleared 01/29/2016  . Acute pain of right shoulder 04/24/2013  . Fasting hyperglycemia 01/10/2012  . Hyperlipidemia 11/11/2009  . 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 Essential hypertension 11/11/2009    Immunization History  Administered Date(s) Administered  . Influenza Split 02/02/2011, 01/10/2012, 01/11/2020  . Influenza Whole 12/22/2009  . Influenza, High Dose Seasonal PF 01/22/2016, 01/24/2017, 01/25/2018, 01/25/2019  . Influenza,inj,Quad PF,6+ Mos 01/17/2014  . PFIZER(Purple Top)SARS-COV-2 Vaccination 05/13/2019, 06/05/2019, 01/11/2020  . Pneumococcal Conjugate-13 03/08/2016  . Pneumococcal Polysaccharide-23 02/20/2019  . Td 11/11/2009  . Zoster 01/20/2015    Conditions to be addressed/monitored:  Hypertension, Hyperlipidemia, Depression, Anxiety and Osteoarthritis  There are no care plans that you recently modified to display for this patient.    Medication Assistance: None required.  Patient affirms current coverage meets needs.  Patient's preferred pharmacy is:  [id number] [address], [address] [place] [address] Phone: [phone] Fax: [phone]  [place], [place] - [address] [place] [address] Phone: [phone] Fax: [phone]  [facility]) - [place], [place] [address] Phone: [phone] Fax: [phone]  Uses pill box? No - prefers bottles Pt endorses 100% compliance  We discussed: Current pharmacy is preferred with insurance plan  and patient is satisfied with pharmacy services Patient decided to: Continue current medication management  strategy  Care Plan and Follow Up Patient Decision:  Patient agrees to Care Plan and Follow-up.  Plan: Telephone follow up appointment with care management team member scheduled for:  ***  ***    Current Barriers:  . {pharmacybarriers:24917} . ***  Pharmacist Clinical Goal(s):  Marland Kitchen Patient will {PHARMACYGOALCHOICES:24921} through collaboration with PharmD and provider.  . ***  Interventions: . 1:1 collaboration with Binnie Rail, MD regarding development and update of comprehensive plan of care as evidenced by provider attestation and co-signature . Inter-disciplinary care team collaboration (see longitudinal plan of care) . Comprehensive medication review performed; medication list updated in electronic medical record  Current Barriers:  . Chronic Disease Management support, education, and care coordination needs related to Hypertension, Hyperlipidemia, and Osteoarthritis   Hypertension (BP goal < 130/80) . *** . Current regimen:  o HCTZ 25 mg daily . Interventions: o Discussed BP goals and benefits of medications for prevention of heart attack / stroke  Hyperlipidemia (LDL goal < 100) . *** . Current regimen:  o Simvastatin 40 mg at bedtime o Aspirin 81 mg daily . Interventions: o Discussed cholesterol goals and benefits of medications for prevention of heart attack / stroke o Discussed lack of benefit with aspirin for healthy adults over 70  Depression / PTSD . *** - managed per Dr Toy Care. Tapering off alprazolam . Current regimen:  o Escitalopram o Bupropion XL 150 mg daily o Lorazepam 1 mg PRN . Interventions: o ***  Chronic pain . *** - back surgery 1/28 . Osteoarthritis of knee, chronic hip pain s/p fall, Sciatica . Current regimen:  o Gabapentin 100 mg 3 times daily o Turmeric 500 mg daily o Ibuprofen 200 mg twice a day . Interventions: o Discussed benefits and risks of medications  Patient Goals/Self-Care Activities . Patient will:  -  {pharmacypatientgoals:24919}  Follow Up Plan: {CM FOLLOW UP QBVQ:94503}

## 2020-07-08 DIAGNOSIS — L570 Actinic keratosis: Secondary | ICD-10-CM | POA: Diagnosis not present

## 2020-07-08 DIAGNOSIS — L814 Other melanin hyperpigmentation: Secondary | ICD-10-CM | POA: Diagnosis not present

## 2020-07-08 DIAGNOSIS — D485 Neoplasm of uncertain behavior of skin: Secondary | ICD-10-CM | POA: Diagnosis not present

## 2020-07-08 DIAGNOSIS — Z86006 Personal history of melanoma in-situ: Secondary | ICD-10-CM | POA: Diagnosis not present

## 2020-07-08 DIAGNOSIS — L578 Other skin changes due to chronic exposure to nonionizing radiation: Secondary | ICD-10-CM | POA: Diagnosis not present

## 2020-07-08 DIAGNOSIS — D2221 Melanocytic nevi of right ear and external auricular canal: Secondary | ICD-10-CM | POA: Diagnosis not present

## 2020-07-08 DIAGNOSIS — L821 Other seborrheic keratosis: Secondary | ICD-10-CM | POA: Diagnosis not present

## 2020-09-15 ENCOUNTER — Telehealth: Payer: Self-pay | Admitting: Pharmacist

## 2020-09-22 NOTE — Progress Notes (Signed)
    Chronic Care Management Pharmacy Assistant   Name: Jasmine Bray  MRN: [id number] DOB: 07-16-1950   Reason for Encounter: Disease State   Conditions to be addressed/monitored: General Call Recent office visits:  None ID  Recent consult visits:  None ID  Hospital visits:  None in previous 6 months  Medications: Outpatient Encounter Medications as of 09/15/2020  Medication Sig Note   aspirin EC 81 MG tablet Take 81 mg by mouth daily.    baclofen (LIORESAL) 10 MG tablet Take 0.5 tablets (5 mg total) by mouth 2 (two) times daily as needed for muscle spasms.    Biotin w/ Vitamins C & E (HAIR/SKIN/NAILS PO) Take by mouth.    buPROPion (WELLBUTRIN XL) 150 MG 24 hr tablet Take 150 mg by mouth daily. 01/10/2013: Dr Toy Care   Cholecalciferol (VITAMIN D3) 3000 UNITS TABS Take 1 capsule by mouth daily. Taking 1000 units daily    estradiol (ESTRACE) 0.1 MG/GM vaginal cream Place 2 g vaginally. 1 GRAM 3x A WEEK  01/10/2013: Sherolyn Buba ,Gyn NP   estradiol (EVAMIST) 1.53 MG/SPRAY transdermal spray Evamist 1.53 mg/spray (1.7 %) transdermal spray  APPLY 1 SPRAY DAILY AS DIRECTED    gabapentin (NEURONTIN) 100 MG capsule Take 1 capsule (100 mg total) by mouth 3 (three) times daily.    hydrochlorothiazide (HYDRODIURIL) 25 MG tablet Take 1 tablet (25 mg total) by mouth daily.    LORazepam (ATIVAN) 1 MG tablet Take 1 mg by mouth daily as needed.    Multiple Vitamin (MULTIVITAMIN) tablet Take 1 tablet by mouth daily.    Omega-3 Fatty Acids (FISH OIL) 1000 MG CAPS Take by mouth daily.    Probiotic Product (PROBIOTIC PEARLS PO) Take 1 tablet by mouth daily.    simvastatin (ZOCOR) 40 MG tablet TAKE 1 TABLET BY MOUTH EVERYDAY AT BEDTIME    Turmeric 500 MG CAPS Take 500 mg by mouth daily.    valACYclovir (VALTREX) 1000 MG tablet Take 1,000 mg by mouth as needed. 1/2 by mouth once daily 01/10/2013: Sherolyn Buba , Concha Norway NP 1/2 pill /day   No facility-administered encounter medications on file as of  09/15/2020.   Pharmacist Review Have you had any problems recently with your health? Patient states that she is doing well and not having any new health issues at this time. She did mention that she had back surgery and that Dr. Quay Burow know about it.  Have you had any problems with your pharmacy? Patient states that she does not have any problems with getting medication or the cost of medications from the pharmacy  What issues or side effects are you having with your medications? Patient states that she is not having any side effects from medications  What would you like me to pass along to [facility] for them to help you with?  Patient states that she has been taking meloxicam but does not know who prescribed it. She would like to have another prescription sent in to the pharmacy  What can we do to take care of you better?  Patient states that if there are any changes she will contact Dr. Quay Burow office  Star Rating Drugs: Simvastatin 08/31/20 90 ds [place] Pharmacist Assistant [phone]   Time spent:30

## 2020-11-03 ENCOUNTER — Telehealth: Payer: Self-pay | Admitting: Pharmacist

## 2020-11-03 ENCOUNTER — Telehealth: Payer: PPO

## 2020-11-03 NOTE — Telephone Encounter (Signed)
  Chronic Care Management   Outreach Note  11/03/2020 Name: Jasmine Bray MRN: [id number] DOB: [birth year]  Referred by: Binnie Rail, MD  Patient had a phone appointment scheduled with clinical pharmacist today.  An unsuccessful telephone outreach was attempted today. The patient was referred to the pharmacist for assistance with medications, care management and care coordination.   Patient will NOT be penalized in any way for missing a CCM appointment. The no-show fee does not apply.  If possible, a message was left to return call to: [phone] or to [facility]: [place], PharmD, Para March, CPP Clinical Pharmacist [facility] [phone]

## 2020-11-03 NOTE — Progress Notes (Deleted)
Chronic Care Management Pharmacy Note  11/03/2020 Name:  Jasmine Bray MRN:  [id number] DOB:  May 12, 1950  Summary: ***  Recommendations/Changes made from today's visit: ***  Plan: ***   Subjective: Jasmine Bray is an 70 y.o. year old female who is a primary patient of Burns, Claudina Lick, MD.  The CCM team was consulted for assistance with disease management and care coordination needs.    {CCMTELEPHONEFACETOFACE:21091510} for {CCMINITIALFOLLOWUPCHOICE:21091511} in response to provider referral for pharmacy case management and/or care coordination services.   Consent to Services:  {CCMCONSENTOPTIONS:25074}  Patient Care Team: Binnie Rail, MD as PCP - General (Internal Medicine) Rosemarie Ax, MD as Consulting Physician (Family Medicine) Chucky May, MD as Consulting Physician (Psychiatry) Charlton Haws, [facility] as Pharmacist (Pharmacist) Opthamology, [facility] as Consulting Physician (Ophthalmology) [place], [facility] (Neurosurgery)  Recent office visits: ***  Recent consult visits: [facility] visits: {Hospital DC Yes/No:25215}   Objective:  Lab Results  Component Value Date   CREATININE 0.64 02/25/2020   BUN 19 02/25/2020   GFR 89.78 02/25/2020   GFRNONAA 80.11 12/22/2009   NA 136 02/25/2020   K 3.8 02/25/2020   CALCIUM 9.4 02/25/2020   CO2 27 02/25/2020   GLUCOSE 95 02/25/2020    Lab Results  Component Value Date/Time   HGBA1C 5.6 02/25/2020 02:24 PM   HGBA1C 5.6 02/20/2019 02:50 PM   GFR 89.78 02/25/2020 02:24 PM   GFR 97.24 02/20/2019 02:50 PM    Last diabetic Eye exam: No results found for: HMDIABEYEEXA  Last diabetic Foot exam: No results found for: HMDIABFOOTEX   Lab Results  Component Value Date   CHOL 215 (H) 02/25/2020   HDL 73.70 02/25/2020   LDLCALC 109 (H) 02/25/2020   LDLDIRECT 113.0 01/20/2015   TRIG 162.0 (H) 02/25/2020   CHOLHDL 3 02/25/2020    Hepatic Function Latest Ref Rng &  Units 02/25/2020 02/20/2019 01/25/2018  Total Protein 6.0 - 8.3 g/dL 7.1 7.1 7.0  Albumin 3.5 - 5.2 g/dL 4.2 4.0 4.3  AST 0 - 37 U/L $Remo'23 28 19  'dFMZc$ ALT 0 - 35 U/L $Remo'25 28 18  'TumaP$ Alk Phosphatase 39 - 117 U/L 57 70 53  Total Bilirubin 0.2 - 1.2 mg/dL 0.4 0.4 0.4  Bilirubin, Direct 0.0 - 0.3 mg/dL - - -    Lab Results  Component Value Date/Time   TSH 1.59 02/25/2020 02:24 PM   TSH 1.58 02/20/2019 02:50 PM    CBC Latest Ref Rng & Units 02/25/2020 02/20/2019 01/25/2018  WBC 4.0 - 10.5 K/uL 9.7 8.2 6.7  Hemoglobin 12.0 - 15.0 g/dL 13.5 13.4 13.9  Hematocrit 36.0 - 46.0 % 38.9 39.3 40.4  Platelets 150.0 - 400.0 K/uL 222.0 214.0 201.0    No results found for: VD25OH  Clinical ASCVD: {YES/NO:21197} The 10-year ASCVD risk score Mikey Bussing DC Jr., et al., 2013) is: 15%   Values used to calculate the score:     Age: 6 years     Sex: Female     Is Non-Hispanic African American: No     Diabetic: No     Tobacco smoker: No     Systolic Blood Pressure: 185 mmHg     Is BP treated: Yes     HDL Cholesterol: 73.7 mg/dL     Total Cholesterol: 215 mg/dL    Depression screen [facility] 2/9 01/21/2020 12/13/2018 11/01/2017  Decreased Interest 0 0 0  Down, Depressed, Hopeless 0 0 0  PHQ - 2 Score 0 0  0  Altered sleeping - 0 1  Tired, decreased energy - 0 0  Change in appetite - 0 0  Feeling bad or failure about yourself  - 0 0  Trouble concentrating - 0 0  Moving slowly or fidgety/restless - 0 0  Suicidal thoughts - 0 0  PHQ-9 Score - 0 1  Difficult doing work/chores - Not difficult at all Not difficult at all     ***Other: (CHADS2VASc if Afib, MMRC or CAT for COPD, ACT, DEXA)  Social History   Tobacco Use  Smoking Status Never  Smokeless Tobacco Never   BP Readings from Last 3 Encounters:  02/25/20 140/84  01/16/20 (!) 177/86  12/20/19 (!) 145/68   Pulse Readings from Last 3 Encounters:  02/25/20 75  01/16/20 (!) 57  12/20/19 (!) 58   Wt Readings from Last 3 Encounters:  02/25/20 184 lb 6.4 oz  (83.6 kg)  11/19/19 180 lb (81.6 kg)  11/07/19 183 lb (83 kg)   BMI Readings from Last 3 Encounters:  02/25/20 28.88 kg/m  11/19/19 28.19 kg/m  11/07/19 27.83 kg/m    Assessment/Interventions: Review of patient past medical history, allergies, medications, health status, including review of consultants reports, laboratory and other test data, was performed as part of comprehensive evaluation and provision of chronic care management services.   SDOH:  (Social Determinants of Health) assessments and interventions performed: {yes/no:20286}  SDOH Screenings   Alcohol Screen: Low Risk    Last Alcohol Screening Score (AUDIT): 0  Depression (PHQ2-9): Low Risk    PHQ-2 Score: 0  Financial Resource Strain: Low Risk    Difficulty of Paying Living Expenses: Not hard at all  Food Insecurity: No Food Insecurity   Worried About Charity fundraiser in the Last Year: Never true   Ran Out of Food in the Last Year: Never true  Housing: Low Risk    Last Housing Risk Score: 0  Physical Activity: Sufficiently Active   Days of Exercise per Week: 7 days   Minutes of Exercise per Session: 60 min  Social Connections: Unknown   Frequency of Communication with Friends and Family: More than three times a week   Frequency of Social Gatherings with Friends and Family: Once a week   Attends Religious Services: Patient refused   Marine scientist or Organizations: Patient refused   Attends Music therapist: Patient refused   Marital Status: Never married  Stress: No Stress Concern Present   Feeling of Stress : Not at all  Tobacco Use: Low Risk    Smoking Tobacco Use: Never   Smokeless Tobacco Use: Never  Transportation Needs: No Transportation Needs   Lack of Transportation (Medical): No   Lack of Transportation (Non-Medical): No    CCM Care Plan  No Known Allergies  Medications Reviewed Today     Reviewed by Binnie Rail, MD (Physician) on 02/25/20 at 1357  Med List  Status: <None>   Medication Order Taking? Sig Documenting Provider Last Dose Status Informant  ALPRAZolam (XANAX) 1 MG tablet [id number]  Take 1 mg by mouth as needed. [provider]  Active            Med Note Linna Darner Douglass Rivers Jan 10, 2012 10:13 AM) Dr Toy Care  aspirin EC 81 MG tablet [id number]  Take 81 mg by mouth daily. [provider]  Active   Biotin w/ Vitamins C & E (HAIR/SKIN/NAILS PO) [id number]  Take by mouth. [provider]  Active   buPROPion (WELLBUTRIN XL) 150 MG 24 hr tablet [id number]  Take 150 mg by mouth daily. [provider]  Active            Med Note Linna Darner, Berna Bue Jan 10, 2013  9:57 AM) Dr Toy Care  Cholecalciferol (VITAMIN D3) 3000 UNITS TABS [id number]  Take 1 capsule by mouth daily. Taking 1000 units daily [provider]  Active    Patient not taking:   Discontinued 12/02/19 1650            Med Note (HOPPER, Douglass Rivers Jan 10, 2012 10:13 AM) Dr Toy Care, Psych  estradiol (ESTRACE) 0.1 MG/GM vaginal cream [id number]  Place 2 g vaginally. 1 GRAM 3x A WEEK  [provider]  Active            Med Note (HOPPER, Berna Bue Jan 10, 2013  9:58 AM) Sherolyn Buba ,Gyn NP  estradiol (EVAMIST) 1.53 MG/SPRAY transdermal spray [id number]  Evamist 1.53 mg/spray (1.7 %) transdermal spray  APPLY 1 SPRAY DAILY AS DIRECTED [provider]  Active   gabapentin (NEURONTIN) 100 MG capsule [id number]  Take 1 capsule (100 mg total) by mouth 3 (three) times daily. Rosemarie Ax, MD  Active   hydrochlorothiazide (HYDRODIURIL) 25 MG tablet [id number]  Take 1 tablet (25 mg total) by mouth daily. Binnie Rail, MD  Active   LORazepam (ATIVAN) 1 MG tablet [id number]  Take 1 mg by mouth daily as needed. [provider]  Active   Multiple Vitamin (MULTIVITAMIN) tablet [id number]  Take 1 tablet by mouth daily. [provider]  Active   Omega-3 Fatty Acids (FISH OIL) 1000 MG CAPS [id number]  Take by mouth daily.  [provider]  Active   Probiotic Product (PROBIOTIC PEARLS PO) [id number]  Take 1 tablet by mouth daily. [provider]  Active   simvastatin (ZOCOR) 40 MG tablet [id number]  Take 1 tablet (40 mg total) by mouth at bedtime. Binnie Rail, MD  Active   Turmeric 500 MG CAPS [id number]  Take 500 mg by mouth daily. [provider]  Active   valACYclovir (VALTREX) 1000 MG tablet [id number]  Take 1,000 mg by mouth as needed. 1/2 by mouth once daily [provider]  Active            Med Note (HOPPER, Berna Bue Jan 10, 2013 10:00 AM) Sherolyn Buba , Gyn NP 1/2 pill /day            Patient Active Problem List   Diagnosis Date Noted   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   Depression 09/06/2019   History of melanoma 02/20/2019   Osteoarthritis 02/20/2019   Primary osteoarthritis of right knee 07/18/2018   Sciatica of left side 10/10/2017   Chronic knee pain 07/25/2017   Left upper arm pain 03/17/2016   Hepatitis C antibody test positive - cleared 01/29/2016   Acute pain of right shoulder 04/24/2013   Fasting hyperglycemia 01/10/2012   Hyperlipidemia 11/11/2009   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   Essential hypertension 11/11/2009    Immunization History  Administered Date(s) Administered   Influenza Split 02/02/2011, 01/10/2012, 01/11/2020   Influenza Whole 12/22/2009   Influenza, High Dose Seasonal PF 01/22/2016, 01/24/2017, 01/25/2018, 01/25/2019   Influenza,inj,Quad PF,6+ Mos 01/17/2014   PFIZER(Purple Top)SARS-COV-2 Vaccination 05/13/2019, 06/05/2019, 01/11/2020   Pneumococcal Conjugate-13  03/08/2016   Pneumococcal Polysaccharide-23 02/20/2019   Td 11/11/2009   Zoster, Live 01/20/2015    Conditions to be addressed/monitored:  {USCCMDZASSESSMENTOPTIONS:23563}  There are no care plans that you recently modified to display for this patient.     Medication Assistance: {MEDASSISTANCEINFO:25044}  Compliance/Adherence/Medication fill history: Care Gaps: Shingrix TDAP (due 11/12/19) - [facility] Covid booster (due 05/13/20) Mammogram  Star-Rating Drugs: Simvastatin - LF 08/31/20  Patient's preferred pharmacy is:  [id number] [address], [address] [place] [address] Phone: [phone] Fax: [phone]  [address] [place] [address] Phone: [phone] Fax: [phone]  [facility]) - [place], [place] [place] [address] Phone: [phone] Fax: [phone]  Uses pill box? {Yes or If no, why not?:20788} Pt endorses ***% compliance  We discussed: {Pharmacy options:24294} Patient decided to: {US Pharmacy Plan:23885}  Care Plan and Follow Up Patient Decision:  {FOLLOWUP:24991}  Plan: {CM FOLLOW UP BWIO:03559}  ***    Current Barriers:  {pharmacybarriers:24917}  Pharmacist Clinical Goal(s):  Patient will {PHARMACYGOALCHOICES:24921} through collaboration with PharmD and provider.   Interventions: 1:1 collaboration with Binnie Rail, MD regarding development and update of comprehensive plan of care as evidenced by provider attestation and co-signature Inter-disciplinary care team collaboration (see longitudinal plan of care) Comprehensive medication review performed; medication list updated in electronic medical record  Hypertension (BP goal < 130/80) *** Current regimen: HCTZ 25 mg daily Interventions: Discussed BP goals and benefits of medications for prevention of heart attack / stroke   Hyperlipidemia (LDL goal < 100) *** Current regimen: Simvastatin 40 mg at bedtime Aspirin 81 mg daily Interventions: Discussed cholesterol goals and benefits of medications for prevention of heart attack / stroke Discussed lack of benefit  with aspirin for healthy adults over 70   Depression / PTSD *** - managed per Dr Toy Care. Tapering off alprazolam Current regimen:  Escitalopram Bupropion XL 150 mg daily Lorazepam 1 mg PRN Interventions: ***   Chronic pain *** - back surgery 1/28 Osteoarthritis of knee, chronic hip pain s/p fall, Sciatica Current regimen: Gabapentin 100 mg 3 times daily Turmeric 500 mg daily Baclofen 10 mg - 1/2 tab BID prn Interventions: Discussed benefits and risks of medications  Patient Goals/Self-Care Activities Patient will:  - {pharmacypatientgoals:24919}

## 2020-11-11 ENCOUNTER — Telehealth: Payer: Self-pay | Admitting: Pharmacist

## 2020-11-11 NOTE — Progress Notes (Addendum)
    Chronic Care Management Pharmacy Assistant   Name: Jasmine Bray  MRN: [id number] DOB: [birth year]  Reason for Encounter: Disease State - General Adherence  Recent office visits:  None listed  Recent consult visits:  None listed  Hospital visits:  None in previous 6 months  Medications: Outpatient Encounter Medications as of 11/11/2020  Medication Sig Note   aspirin EC 81 MG tablet Take 81 mg by mouth daily.    baclofen (LIORESAL) 10 MG tablet Take 0.5 tablets (5 mg total) by mouth 2 (two) times daily as needed for muscle spasms.    Biotin w/ Vitamins C & E (HAIR/SKIN/NAILS PO) Take by mouth.    buPROPion (WELLBUTRIN XL) 150 MG 24 hr tablet Take 150 mg by mouth daily. 01/10/2013: Dr Toy Care   Cholecalciferol (VITAMIN D3) 3000 UNITS TABS Take 1 capsule by mouth daily. Taking 1000 units daily    escitalopram (LEXAPRO) 20 MG tablet Take 20 mg by mouth daily.    estradiol (ESTRACE) 0.1 MG/GM vaginal cream Place 2 g vaginally. 1 GRAM 3x A WEEK  01/10/2013: Sherolyn Buba ,Gyn NP   estradiol (EVAMIST) 1.53 MG/SPRAY transdermal spray Evamist 1.53 mg/spray (1.7 %) transdermal spray  APPLY 1 SPRAY DAILY AS DIRECTED    gabapentin (NEURONTIN) 100 MG capsule Take 1 capsule (100 mg total) by mouth 3 (three) times daily.    hydrochlorothiazide (HYDRODIURIL) 25 MG tablet Take 1 tablet (25 mg total) by mouth daily.    LORazepam (ATIVAN) 1 MG tablet Take 1 mg by mouth daily as needed.    Multiple Vitamin (MULTIVITAMIN) tablet Take 1 tablet by mouth daily.    Omega-3 Fatty Acids (FISH OIL) 1000 MG CAPS Take by mouth daily.    Probiotic Product (PROBIOTIC PEARLS PO) Take 1 tablet by mouth daily.    simvastatin (ZOCOR) 40 MG tablet TAKE 1 TABLET BY MOUTH EVERYDAY AT BEDTIME    Turmeric 500 MG CAPS Take 500 mg by mouth daily.    valACYclovir (VALTREX) 1000 MG tablet Take 1,000 mg by mouth as needed. 1/2 by mouth once daily 01/10/2013: Sherolyn Buba , Gyn NP 1/2 pill /day   [DISCONTINUED]  escitalopram (LEXAPRO) 20 MG tablet Take 10 mg by mouth daily.  (Patient not taking: Reported on 10/16/2019) 01/10/2012: Dr Toy Care, Psych   No facility-administered encounter medications on file as of 11/11/2020.    Have you had any problems recently with your health? Patient states no problems with health.  Have you had any problems with your pharmacy? Patient states no problems with pharmacy.   What issues or side effects are you having with your medications? Patient states no issues or side effects.   What would you like me to pass along to [facility] for them to help you with?  Patient states she started back taking her Lexapro 20 mg at night due to depression, she takes the Wellbutrin XL 150 mg in the mornings.     What can we do to take care of you better? Patient states nothing at this time.   Star Rating Drugs: Simvastatin - last fill 11/10/20 [place], RMA Clinical Pharmacists Assistant [phone]  Time Spent: [id number]

## 2021-01-13 DIAGNOSIS — D2221 Melanocytic nevi of right ear and external auricular canal: Secondary | ICD-10-CM | POA: Diagnosis not present

## 2021-01-13 DIAGNOSIS — L821 Other seborrheic keratosis: Secondary | ICD-10-CM | POA: Diagnosis not present

## 2021-01-13 DIAGNOSIS — L814 Other melanin hyperpigmentation: Secondary | ICD-10-CM | POA: Diagnosis not present

## 2021-01-13 DIAGNOSIS — Z86006 Personal history of melanoma in-situ: Secondary | ICD-10-CM | POA: Diagnosis not present

## 2021-01-13 DIAGNOSIS — Z23 Encounter for immunization: Secondary | ICD-10-CM | POA: Diagnosis not present

## 2021-01-13 DIAGNOSIS — L570 Actinic keratosis: Secondary | ICD-10-CM | POA: Diagnosis not present

## 2021-01-13 DIAGNOSIS — L578 Other skin changes due to chronic exposure to nonionizing radiation: Secondary | ICD-10-CM | POA: Diagnosis not present

## 2021-01-21 ENCOUNTER — Ambulatory Visit (INDEPENDENT_AMBULATORY_CARE_PROVIDER_SITE_OTHER): Payer: PPO

## 2021-01-21 ENCOUNTER — Other Ambulatory Visit: Payer: Self-pay

## 2021-01-21 DIAGNOSIS — Z1211 Encounter for screening for malignant neoplasm of colon: Secondary | ICD-10-CM | POA: Diagnosis not present

## 2021-01-21 DIAGNOSIS — Z0000 Encounter for general adult medical examination without abnormal findings: Secondary | ICD-10-CM

## 2021-01-21 NOTE — Progress Notes (Signed)
Subjective:   Jasmine Bray is a 70 y.o. female who presents for Medicare Annual (Subsequent) preventive examination.  Review of Systems     Cardiac Risk Factors include: advanced age (>57men, >68 women);dyslipidemia;family history of premature cardiovascular disease;hypertension     Objective:    There were no vitals filed for this visit. There is no height or weight on file to calculate BMI.  Advanced Directives 01/21/2021 01/21/2020 12/13/2018 11/01/2017 10/21/2016 09/11/2013  Does Patient Have a Medical Advance Directive? No No No No No Patient does not have advance directive  Does patient want to make changes to medical advance directive? Yes (MAU/Ambulatory/Procedural Areas - Information given) - No - Patient declined - - -  Would patient like information on creating a medical advance directive? - No - Patient declined - Yes (ED - Information included in AVS) Yes (ED - Information included in AVS) -  Pre-existing out of facility DNR order (yellow form or pink MOST form) - - - - - No    Current Medications (verified) Outpatient Encounter Medications as of 01/21/2021  Medication Sig   aspirin EC 81 MG tablet Take 81 mg by mouth daily.   buPROPion (WELLBUTRIN XL) 150 MG 24 hr tablet Take 150 mg by mouth daily.   Cholecalciferol (VITAMIN D3) 3000 UNITS TABS Take 1 capsule by mouth daily. Taking 1000 units daily   escitalopram (LEXAPRO) 20 MG tablet Take 20 mg by mouth daily.   estradiol (ESTRACE) 0.1 MG/GM vaginal cream Place 2 g vaginally. 1 GRAM 3x A WEEK    estradiol (EVAMIST) 1.53 MG/SPRAY transdermal spray Evamist 1.53 mg/spray (1.7 %) transdermal spray  APPLY 1 SPRAY DAILY AS DIRECTED   hydrochlorothiazide (HYDRODIURIL) 25 MG tablet Take 1 tablet (25 mg total) by mouth daily.   LORazepam (ATIVAN) 1 MG tablet Take 1 mg by mouth daily as needed.   Multiple Vitamin (MULTIVITAMIN) tablet Take 1 tablet by mouth daily.   Omega-3 Fatty Acids (FISH OIL) 1000 MG CAPS Take by mouth  daily.   Probiotic Product (PROBIOTIC PEARLS PO) Take 1 tablet by mouth daily.   simvastatin (ZOCOR) 40 MG tablet TAKE 1 TABLET BY MOUTH EVERYDAY AT BEDTIME   Turmeric 500 MG CAPS Take 500 mg by mouth daily.   valACYclovir (VALTREX) 1000 MG tablet Take 1,000 mg by mouth as needed. 1/2 by mouth once daily   baclofen (LIORESAL) 10 MG tablet Take 0.5 tablets (5 mg total) by mouth 2 (two) times daily as needed for muscle spasms. (Patient not taking: Reported on 01/21/2021)   Biotin w/ Vitamins C & E (HAIR/SKIN/NAILS PO) Take by mouth. (Patient not taking: Reported on 01/21/2021)   gabapentin (NEURONTIN) 100 MG capsule Take 1 capsule (100 mg total) by mouth 3 (three) times daily. (Patient not taking: Reported on 01/21/2021)   No facility-administered encounter medications on file as of 01/21/2021.    Allergies (verified) Patient has no known allergies.   History: Past Medical History:  Diagnosis Date   Allergy    RHINITIS   Anxiety 1989   Dr Jake Michaelis, FP   Back injury    Depression    Dr Toy Care   Fibroids    GERD 11/11/2009   Qualifier: Diagnosis of  By: Linna Darner MD, Gwyndolyn Saxon   Dysphagia once monthly on average 4-10/14 ; resolved with Prilosec OTC prn No PMH of endoscopy     GERD (gastroesophageal reflux disease)    Hyperlipidemia    Hypertension    Past Surgical History:  Procedure Laterality Date  ABDOMINAL HYSTERECTOMY     & USO   ANAL FISTULA ECTOMY      X 2, Dr Rosana Hoes   COLONOSCOPY  2004, 2015   [place] Bilateral    [facility]   MYOMECTOMY     FOR FIBROIDS, Dr Ubaldo Glassing   steroid injection shoulder Right 05/11/2013   contusion/strain; Dr Onnie Graham   Family History  Problem Relation Age of Onset   Arthritis Mother    Stroke Mother 3   Alcohol abuse Father    Hyperlipidemia Sister    Hypertension Sister    Atrial fibrillation Sister    Breast cancer Paternal Aunt    Diabetes Maternal Grandmother    Heart disease Paternal Grandmother        CAD; no MI   Stroke  Paternal Grandmother        in 84s   Stroke Paternal Grandfather        in late 62s   Colon cancer Neg Hx    Social History   Socioeconomic History   Marital status: Divorced    Spouse name: Not on file   Number of children: Not on file   Years of education: Not on file   Highest education level: Not on file  Occupational History   Occupation: Press photographer  Tobacco Use   Smoking status: Never   Smokeless tobacco: Never  Vaping Use   Vaping Use: Never used  Substance and Sexual Activity   Alcohol use: Yes    Comment: decreasing wine intake   Drug use: No   Sexual activity: Not on file  Other Topics Concern   Not on file  Social History Narrative   GETS REG EXERCISE         Social Determinants of Health   Financial Resource Strain: Low Risk    Difficulty of Paying Living Expenses: Not hard at all  Food Insecurity: No Food Insecurity   Worried About Charity fundraiser in the Last Year: Never true   [place] in the Last Year: Never true  Transportation Needs: No Transportation Needs   Lack of Transportation (Medical): No   Lack of Transportation (Non-Medical): No  Physical Activity: Sufficiently Active   Days of Exercise per Week: 5 days   Minutes of Exercise per Session: 30 min  Stress: No Stress Concern Present   Feeling of Stress : Not at all  Social Connections: Moderately Integrated   Frequency of Communication with Friends and Family: More than three times a week   Frequency of Social Gatherings with Friends and Family: More than three times a week   Attends Religious Services: More than 4 times per year   Active Member of [employer] or Organizations: Yes   Attends Music therapist: More than 4 times per year   Marital Status: Never married    Tobacco Counseling Counseling given: Not Answered   Clinical Intake:  Pre-visit preparation completed: Yes  Pain : No/denies pain     Nutritional Risks: None Diabetes: No  How often do you need  to have someone help you when you read instructions, pamphlets, or other written materials from your doctor or pharmacy?: 1 - Never What is the last grade level you completed in school?: [place]; 3.5 years of college  Diabetic? no  Interpreter Needed?: No  Information entered by :: Lisette Abu, LPN   Activities of Daily Living In your present state of health, do you have any difficulty performing the  following activities: 01/21/2021  Hearing? N  Vision? N  Difficulty concentrating or making decisions? N  Walking or climbing stairs? N  Dressing or bathing? N  Doing errands, shopping? N  Preparing Food and eating ? N  Using the Toilet? N  In the past six months, have you accidently leaked urine? N  Do you have problems with loss of bowel control? N  Managing your Medications? N  Managing your Finances? N  Housekeeping or managing your Housekeeping? N  Some recent data might be hidden    Patient Care Team: Binnie Rail, MD as PCP - General (Internal Medicine) Rosemarie Ax, MD as Consulting Physician (Family Medicine) Chucky May, MD as Consulting Physician (Psychiatry) Charlton Haws, [facility] as Pharmacist (Pharmacist) Opthamology, [facility] as Consulting Physician (Ophthalmology) [place], [facility] (Neurosurgery)  Indicate any recent Medical Services you may have received from other than [facility] providers in the past year (date may be approximate).     Assessment:   This is a routine wellness examination for [place].  Hearing/Vision screen Hearing Screening - Comments:: Patient denied any hearing difficulty.   No hearing aids.  Vision Screening - Comments:: Patient wears corrective glasses/contacts.  Eye exam done annually by: Jola Schmidt, MD.  Dietary issues and exercise activities discussed: Current Exercise Habits: Home exercise routine, Type of exercise: walking;stretching;strength training/weights;Other - see  comments (swimming), Time (Minutes): 30, Frequency (Times/Week): 5, Weekly Exercise (Minutes/Week): 150, Intensity: Moderate, Exercise limited by: None identified   Goals Addressed               This Visit's Progress     Patient Stated (pt-stated)        My goal is to get back in the gym and increase my cardio.      Depression Screen PHQ 2/9 Scores 01/21/2021 01/21/2020 12/13/2018 11/01/2017 10/21/2016 01/22/2016 01/10/2013  PHQ - 2 Score 0 0 0 0 2 0 0  PHQ- 9 Score - - 0 1 3 - -    Fall Risk Fall Risk  01/21/2021 01/21/2020 02/20/2019 12/13/2018 11/01/2017  Falls in the past year? 1 1 0 0 No  Number falls in past yr: 0 0 0 0 -  Injury with Fall? 1 1 - 0 -  Risk for fall due to : - Other (Comment) - - -  Risk for fall due to: Comment - accidently triped and fell on her back - - -  Follow up - Falls evaluation completed - - -    FALL RISK PREVENTION PERTAINING TO THE HOME:  Any stairs in or around the home? No  If so, are there any without handrails? No  Home free of loose throw rugs in walkways, pet beds, electrical cords, etc? Yes  Adequate lighting in your home to reduce risk of falls? Yes   ASSISTIVE DEVICES UTILIZED TO PREVENT FALLS:  Life alert? No  Use of a cane, walker or w/c? No  Grab bars in the bathroom? No  Shower chair or bench in shower? No  Elevated toilet seat or a handicapped toilet? No   TIMED UP AND GO:  Was the test performed? Yes .  Length of time to ambulate 10 feet: 6 sec.   Gait steady and fast without use of assistive device  Cognitive Function: Normal cognitive status assessed by direct observation by this Nurse Health Advisor. No abnormalities found.          Immunizations Immunization History  Administered Date(s) Administered   Influenza  Split 02/02/2011, 01/10/2012, 01/11/2020   Influenza Whole 12/22/2009   Influenza, High Dose Seasonal PF 01/22/2016, 01/24/2017, 01/25/2018, 01/25/2019   Influenza,inj,Quad PF,6+ Mos 01/17/2014    PFIZER(Purple Top)SARS-COV-2 Vaccination 05/13/2019, 06/05/2019, 01/11/2020   Pneumococcal Conjugate-13 03/08/2016   Pneumococcal Polysaccharide-23 02/20/2019   Td 11/11/2009   Zoster, Live 01/20/2015    TDAP status: Due, Education has been provided regarding the importance of this vaccine. Advised may receive this vaccine at local pharmacy or Health Dept. Aware to provide a copy of the vaccination record if obtained from local pharmacy or Health Dept. Verbalized acceptance and understanding.  Flu Vaccine status: Up to date  Pneumococcal vaccine status: Up to date  Covid-19 vaccine status: Completed vaccines  Qualifies for Shingles Vaccine? Yes   Zostavax completed Yes   Shingrix Completed?: No.    Education has been provided regarding the importance of this vaccine. Patient has been advised to call insurance company to determine out of pocket expense if they have not yet received this vaccine. Advised may also receive vaccine at local pharmacy or Health Dept. Verbalized acceptance and understanding.  Screening Tests Health Maintenance  Topic Date Due   Zoster Vaccines- Shingrix (1 of 2) Never done   TETANUS/TDAP  11/12/2019   MAMMOGRAM  12/14/2019   COVID-19 Vaccine (4 - Booster for Pfizer series) 03/07/2020   INFLUENZA VACCINE  10/27/2020   DEXA SCAN  12/02/2022   COLONOSCOPY (Pts 45-27yrs Insurance coverage will need to be confirmed)  09/26/2023   Pneumonia Vaccine 65+ Years old  Completed   Hepatitis C Screening  Completed   HPV VACCINES  Aged Out    Health Maintenance  Health Maintenance Due  Topic Date Due   Zoster Vaccines- Shingrix (1 of 2) Never done   TETANUS/TDAP  11/12/2019   MAMMOGRAM  12/14/2019   COVID-19 Vaccine (4 - Booster for Pfizer series) 03/07/2020   INFLUENZA VACCINE  10/27/2020    Colorectal cancer screening: Type of screening: Colonoscopy. Completed 09/25/2013. Repeat every 10 years   Cologuard referral placed: 01/21/2021  Mammogram status:  Completed 12/13/2017. Repeat every year  Bone Density status: Completed 12/01/2017. Results reflect: Bone density results: NORMAL. Repeat every 5 years.  Lung Cancer Screening: (Low Dose CT Chest recommended if Age 41-80 years, 30 pack-year currently smoking OR have quit w/in 15years.) does not qualify.   Lung Cancer Screening Referral: no  Additional Screening:  Hepatitis C Screening: does qualify; Completed yes  Vision Screening: Recommended annual ophthalmology exams for early detection of glaucoma and other disorders of the eye. Is the patient up to date with their annual eye exam?  Yes  Who is the provider or what is the name of the office in which the patient attends annual eye exams? Jola Schmidt, MD. If pt is not established with a provider, would they like to be referred to a provider to establish care? No .   Dental Screening: Recommended annual dental exams for proper oral hygiene  Community Resource Referral / Chronic Care Management: CRR required this visit?  No   CCM required this visit?  No      Plan:     I have personally reviewed and noted the following in the patient's chart:   Medical and social history Use of alcohol, tobacco or illicit drugs  Current medications and supplements including opioid prescriptions.  Functional ability and status Nutritional status Physical activity Advanced directives List of other physicians Hospitalizations, surgeries, and ER visits in previous 12 months Vitals Screenings to include cognitive,  depression, and falls Referrals and appointments  In addition, I have reviewed and discussed with patient certain preventive protocols, quality metrics, and best practice recommendations. A written personalized care plan for preventive services as well as general preventive health recommendations were provided to patient.     Sheral Flow, LPN   10/17/8286   Nurse Notes:  Patient declined vital signs and weight. Hearing  Screening - Comments:: Patient denied any hearing difficulty.   No hearing aids.  Vision Screening - Comments:: Patient wears corrective glasses/contacts.  Eye exam done annually by: Jola Schmidt, MD.

## 2021-01-21 NOTE — Patient Instructions (Addendum)
Jasmine Bray , Thank you for taking time to come for your Medicare Wellness Visit. I appreciate your ongoing commitment to your health goals. Please review the following plan we discussed and let me know if I can assist you in the future.   Screening recommendations/referrals: Colonoscopy: 09/25/2013 due every 10 years; Cologuard ordered  Mammogram: 12/13/2017 due every 2 years Bone Density: 12/01/2017 due every 5 years Recommended yearly ophthalmology/optometry visit for glaucoma screening and checkup Recommended yearly dental visit for hygiene and checkup  Vaccinations: Influenza vaccine: 11/2020 Pneumococcal vaccine: 03/08/2016, 02/20/2019 Tdap vaccine: 11/11/2009; due every 10 years (overdue) Shingles vaccine: never done; can check with local pharmacy   Covid-19: 05/13/2019, 06/05/2019, 01/11/2020  Advanced directives: Advance directive discussed with you today. I have provided a copy for you to complete at home and have notarized. Once this is complete please bring a copy in to our office so we can scan it into your chart.   Conditions/risks identified: Yes; Client understands the importance of follow-up with providers by attending scheduled visits and discussed goals to eat healthier, increase physical activity, exercise the brain, socialize more, get enough sleep and make time for laughter.  Next appointment: Please schedule your next Medicare Wellness Visit with your Nurse Health Advisor in 1 year by calling [phone].   Preventive Care 9 Years and Older, Female Preventive care refers to lifestyle choices and visits with your health care provider that can promote health and wellness. What does preventive care include? A yearly physical exam. This is also called an annual well check. Dental exams once or twice a year. Routine eye exams. Ask your health care provider how often you should have your eyes checked. Personal lifestyle choices, including: Daily care of your teeth and  gums. Regular physical activity. Eating a healthy diet. Avoiding tobacco and drug use. Limiting alcohol use. Practicing safe sex. Taking low-dose aspirin every day. Taking vitamin and mineral supplements as recommended by your health care provider. What happens during an annual well check? The services and screenings done by your health care provider during your annual well check will depend on your age, overall health, lifestyle risk factors, and family history of disease. Counseling  Your health care provider may ask you questions about your: Alcohol use. Tobacco use. Drug use. Emotional well-being. Home and relationship well-being. Sexual activity. Eating habits. History of falls. Memory and ability to understand (cognition). Work and work Statistician. Reproductive health. Screening  You may have the following tests or measurements: Height, weight, and BMI. Blood pressure. Lipid and cholesterol levels. These may be checked every 5 years, or more frequently if you are over 71 years old. Skin check. Lung cancer screening. You may have this screening every year starting at age 19 if you have a 30-pack-year history of smoking and currently smoke or have quit within the past 15 years. Fecal occult blood test (FOBT) of the stool. You may have this test every year starting at age 59. Flexible sigmoidoscopy or colonoscopy. You may have a sigmoidoscopy every 5 years or a colonoscopy every 10 years starting at age 23. Hepatitis C blood test. Hepatitis B blood test. Sexually transmitted disease (STD) testing. Diabetes screening. This is done by checking your blood sugar (glucose) after you have not eaten for a while (fasting). You may have this done every 1-3 years. Bone density scan. This is done to screen for osteoporosis. You may have this done starting at age 23. Mammogram. This may be done every 1-2 years. Talk to your  health care provider about how often you should have regular  mammograms. Talk with your health care provider about your test results, treatment options, and if necessary, the need for more tests. Vaccines  Your health care provider may recommend certain vaccines, such as: Influenza vaccine. This is recommended every year. Tetanus, diphtheria, and acellular pertussis (Tdap, Td) vaccine. You may need a Td booster every 10 years. Zoster vaccine. You may need this after age 61. Pneumococcal 13-valent conjugate (PCV13) vaccine. One dose is recommended after age 41. Pneumococcal polysaccharide (PPSV23) vaccine. One dose is recommended after age 60. Talk to your health care provider about which screenings and vaccines you need and how often you need them. This information is not intended to replace advice given to you by your health care provider. Make sure you discuss any questions you have with your health care provider. Document Released: 04/11/2015 Document Revised: 12/03/2015 Document Reviewed: 01/14/2015 Elsevier Interactive Patient Education  2017 [place] Prevention in the Home Falls can cause injuries. They can happen to people of all ages. There are many things you can do to make your home safe and to help prevent falls. What can I do on the outside of my home? Regularly fix the edges of walkways and driveways and fix any cracks. Remove anything that might make you trip as you walk through a door, such as a raised step or threshold. Trim any bushes or trees on the path to your home. Use bright outdoor lighting. Clear any walking paths of anything that might make someone trip, such as rocks or tools. Regularly check to see if handrails are loose or broken. Make sure that both sides of any steps have handrails. Any raised decks and porches should have guardrails on the edges. Have any leaves, snow, or ice cleared regularly. Use sand or salt on walking paths during winter. Clean up any spills in your garage right away. This includes oil  or grease spills. What can I do in the bathroom? Use night lights. Install grab bars by the toilet and in the tub and shower. Do not use towel bars as grab bars. Use non-skid mats or decals in the tub or shower. If you need to sit down in the shower, use a plastic, non-slip stool. Keep the floor dry. Clean up any water that spills on the floor as soon as it happens. Remove soap buildup in the tub or shower regularly. Attach bath mats securely with double-sided non-slip rug tape. Do not have throw rugs and other things on the floor that can make you trip. What can I do in the bedroom? Use night lights. Make sure that you have a light by your bed that is easy to reach. Do not use any sheets or blankets that are too big for your bed. They should not hang down onto the floor. Have a firm chair that has side arms. You can use this for support while you get dressed. Do not have throw rugs and other things on the floor that can make you trip. What can I do in the kitchen? Clean up any spills right away. Avoid walking on wet floors. Keep items that you use a lot in easy-to-reach places. If you need to reach something above you, use a strong step stool that has a grab bar. Keep electrical cords out of the way. Do not use floor polish or wax that makes floors slippery. If you must use wax, use non-skid floor wax. Do not have  throw rugs and other things on the floor that can make you trip. What can I do with my stairs? Do not leave any items on the stairs. Make sure that there are handrails on both sides of the stairs and use them. Fix handrails that are broken or loose. Make sure that handrails are as long as the stairways. Check any carpeting to make sure that it is firmly attached to the stairs. Fix any carpet that is loose or worn. Avoid having throw rugs at the top or bottom of the stairs. If you do have throw rugs, attach them to the floor with carpet tape. Make sure that you have a light  switch at the top of the stairs and the bottom of the stairs. If you do not have them, ask someone to add them for you. What else can I do to help prevent falls? Wear shoes that: Do not have high heels. Have rubber bottoms. Are comfortable and fit you well. Are closed at the toe. Do not wear sandals. If you use a stepladder: Make sure that it is fully opened. Do not climb a closed stepladder. Make sure that both sides of the stepladder are locked into place. Ask someone to hold it for you, if possible. Clearly mark and make sure that you can see: Any grab bars or handrails. First and last steps. Where the edge of each step is. Use tools that help you move around (mobility aids) if they are needed. These include: Canes. Walkers. Scooters. Crutches. Turn on the lights when you go into a dark area. Replace any light bulbs as soon as they burn out. Set up your furniture so you have a clear path. Avoid moving your furniture around. If any of your floors are uneven, fix them. If there are any pets around you, be aware of where they are. Review your medicines with your doctor. Some medicines can make you feel dizzy. This can increase your chance of falling. Ask your doctor what other things that you can do to help prevent falls. This information is not intended to replace advice given to you by your health care provider. Make sure you discuss any questions you have with your health care provider. Document Released: 01/09/2009 Document Revised: 08/21/2015 Document Reviewed: 04/19/2014 Elsevier Interactive Patient Education  2017 [employer].

## 2021-01-22 NOTE — Progress Notes (Signed)
Medical screening examination/treatment/procedure(s) were performed by non-physician practitioner and as supervising physician I was immediately available for consultation/collaboration.  I agree with above. Rily Nickey J Alyss Granato, MD  

## 2021-02-18 IMAGING — DX DG HIP (WITH OR WITHOUT PELVIS) 2-3V*L*
3 series · 3 of 3 positions shown · non-contrast
Comparison: None.

CLINICAL DATA: Left-sided sciatica, worsening for 3 weeks. No
reported injury.

EXAM:
DG HIP (WITH OR WITHOUT PELVIS) 2-3V LEFT

[pelvis ap]
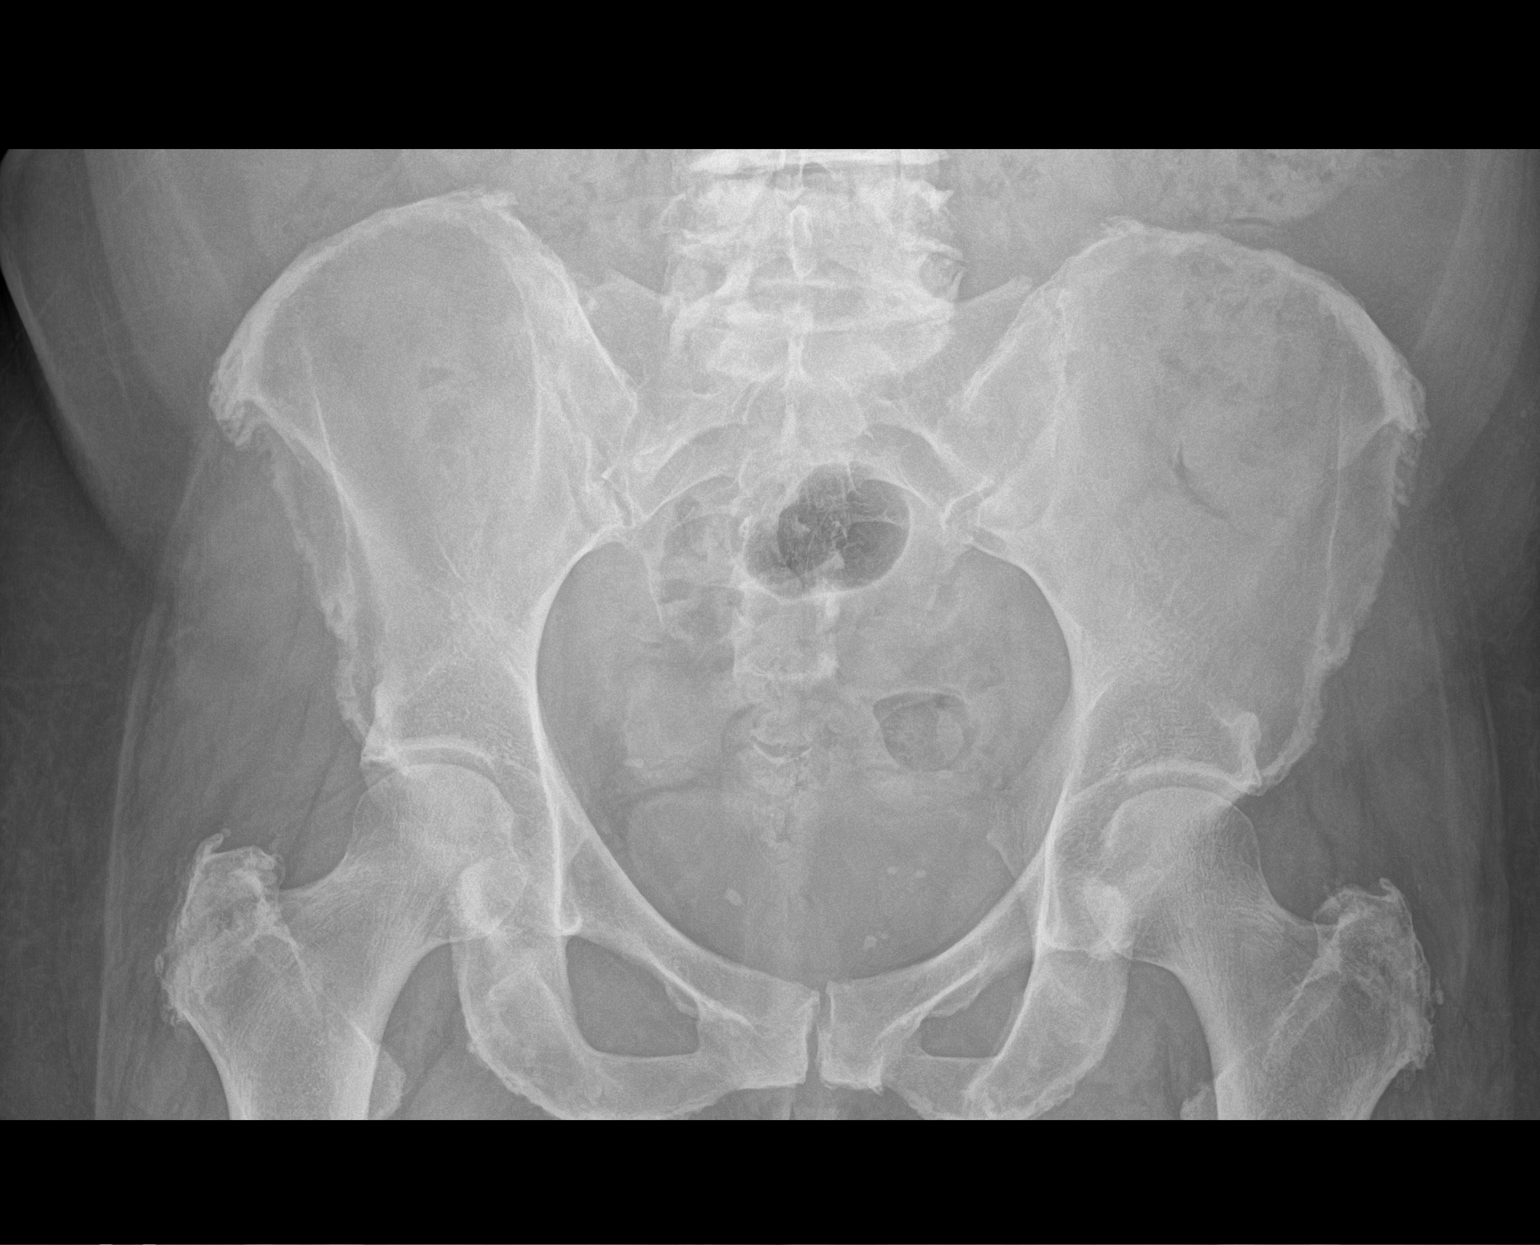

[hip ap]
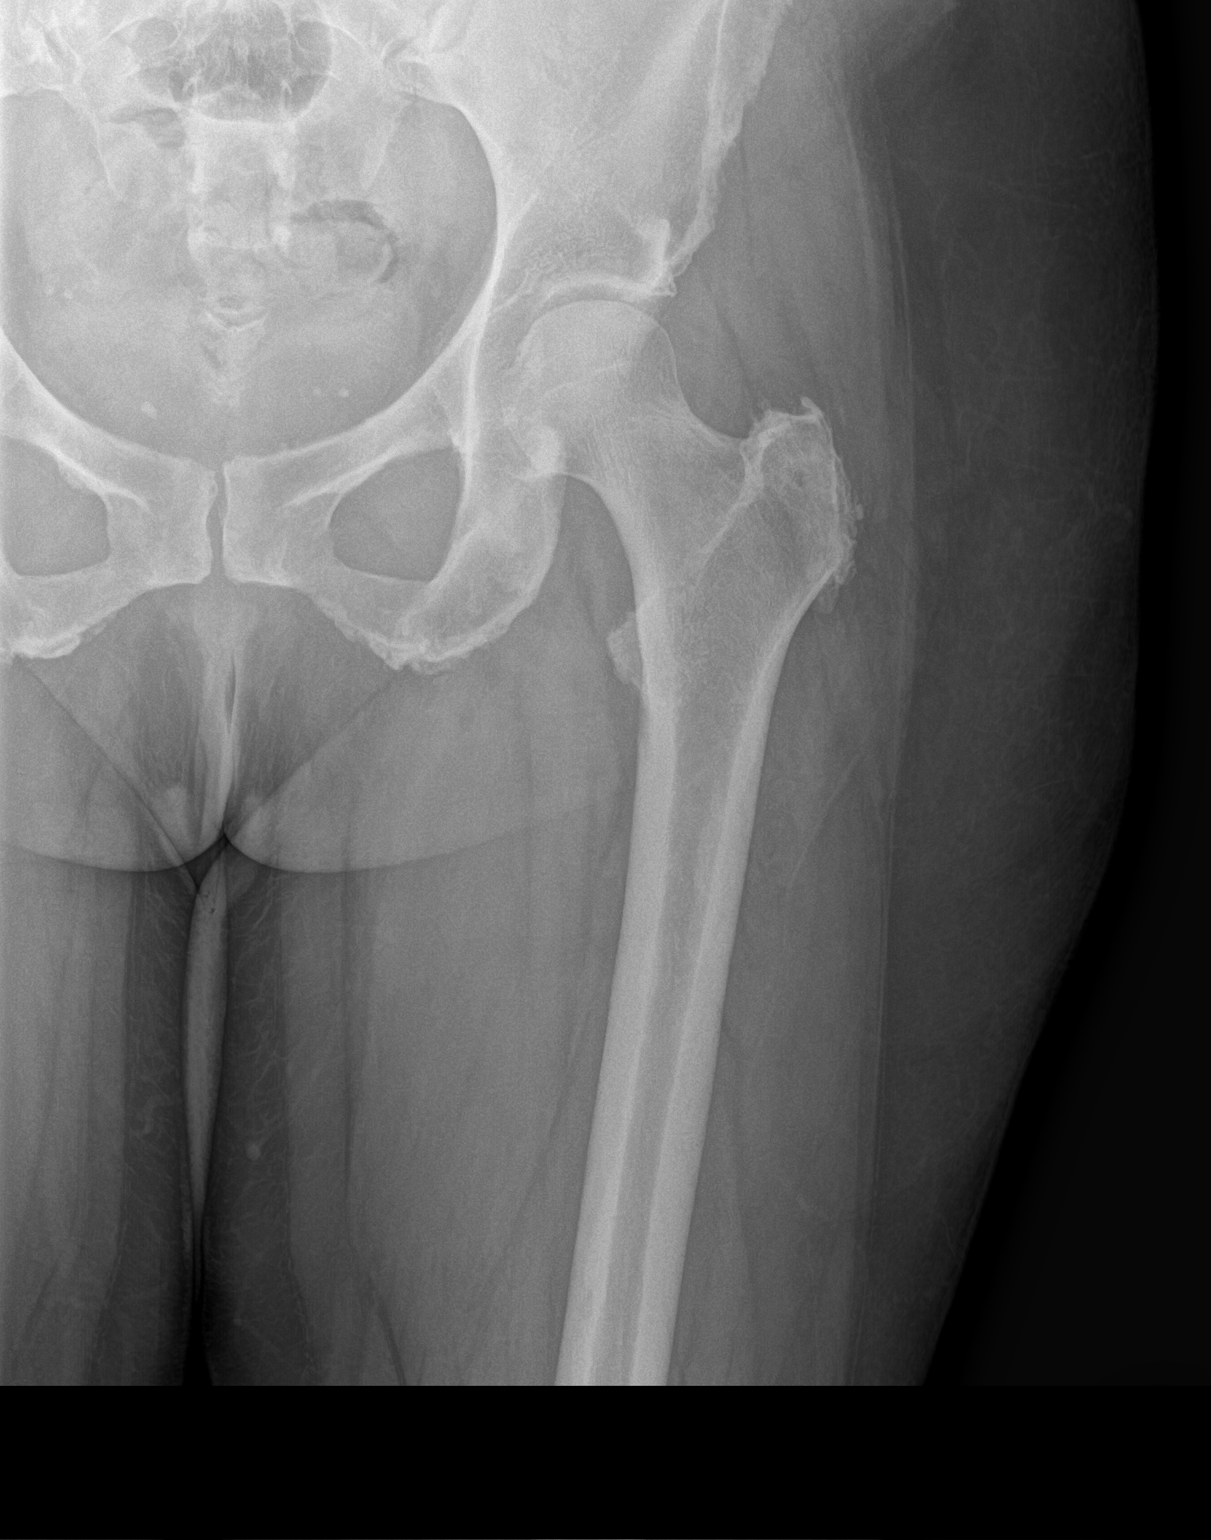

[hip frog leg]
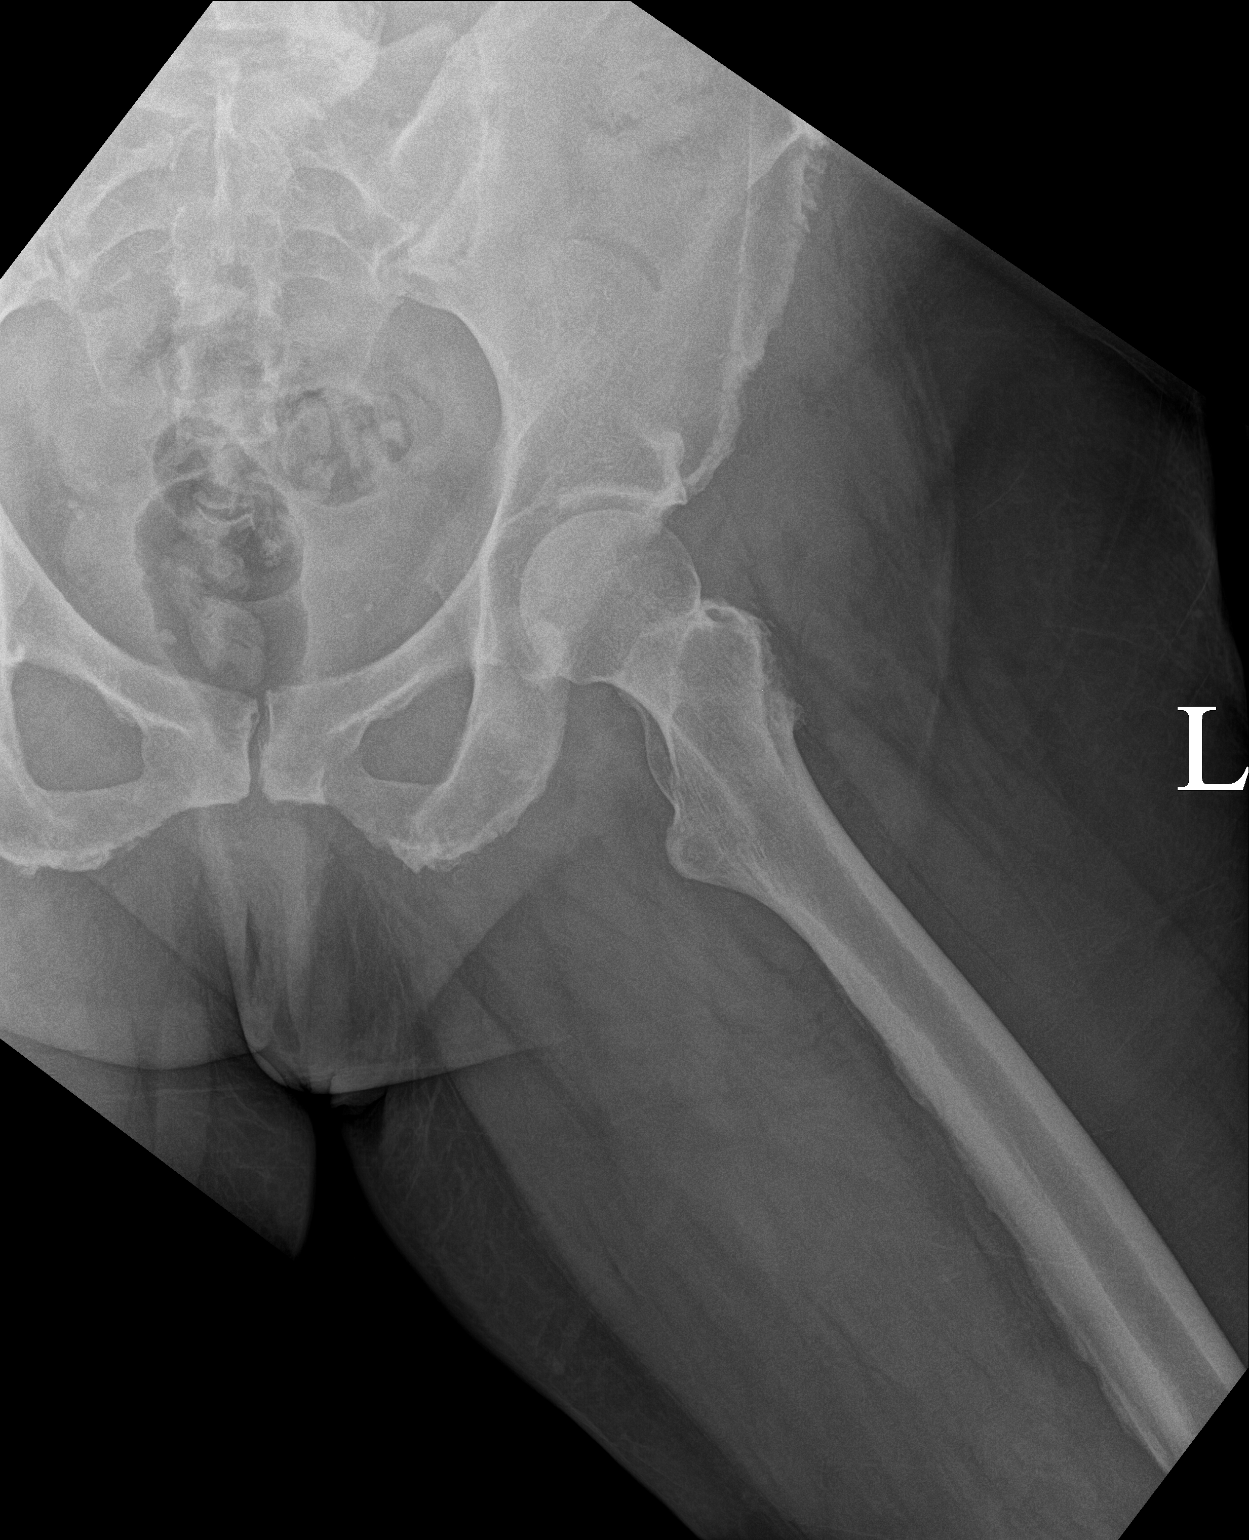

[3 of 3 positions shown; findings below may reference images not displayed]

FINDINGS: No pelvic fracture or diastasis. No left hip fracture or
dislocation. Minimal hypertrophic change at the superior left
acetabular margin. Scattered small enthesophytes throughout the
bilateral pelvic girdle and bilateral greater trochanters.
Degenerative changes in the visualized lower lumbar spine.
IMPRESSION: No acute osseous abnormality. Minimal hypertrophic change at the
superior left acetabular margin. Scattered bilateral pelvic
enthesophytes.

## 2021-02-25 ENCOUNTER — Encounter: Payer: Self-pay | Admitting: Internal Medicine

## 2021-02-25 NOTE — Progress Notes (Signed)
Subjective:    Patient ID: Jasmine Bray, female    DOB: 06-19-1950, 70 y.o.   MRN: [id number]   This visit occurred during the SARS-CoV-2 public health emergency.  Safety protocols were in place, including screening questions prior to the visit, additional usage of staff PPE, and extensive cleaning of exam room while observing appropriate contact time as indicated for disinfecting solutions.    HPI She is here for a physical exam.   ?  Taking statin  Medications and allergies reviewed with patient and updated if appropriate.  Patient Active Problem List   Diagnosis Date Noted  . Effusion of joint of right shoulder 03/03/2021  . 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  . 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  . Depression 09/06/2019  . History of melanoma 02/20/2019  . Osteoarthritis 02/20/2019  . Primary osteoarthritis of right knee 07/18/2018  . Sciatica of left side 10/10/2017  . Chronic knee pain 07/25/2017  . Hepatitis C antibody test positive - cleared 01/29/2016  . Acute pain of right shoulder 04/24/2013  . Prediabetes 01/10/2012  . Hyperlipidemia 11/11/2009  . 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 Essential hypertension 11/11/2009    Current Outpatient Medications on File Prior to Visit  Medication Sig Dispense Refill  . aspirin EC 81 MG tablet Take 81 mg by mouth daily.    Marland Kitchen buPROPion (WELLBUTRIN XL) 150 MG 24 hr tablet Take 150 mg by mouth daily.    . Cholecalciferol (VITAMIN D3) 3000 UNITS TABS Take 1 capsule by mouth daily. Taking 1000 units daily    . escitalopram (LEXAPRO) 20 MG tablet Take 20 mg by mouth daily.    Marland Kitchen estradiol (ESTRACE) 0.1 MG/GM vaginal cream Place 2 g vaginally. 1 GRAM 3x A WEEK     . estradiol (EVAMIST) 1.53 MG/SPRAY transdermal spray Evamist 1.53 mg/spray (1.7 %) transdermal spray  APPLY 1 SPRAY DAILY AS DIRECTED    . hydrochlorothiazide (HYDRODIURIL) 25 MG tablet Take 1 tablet (25 mg total) by  mouth daily. 90 tablet 3  . LORazepam (ATIVAN) 1 MG tablet Take 1 mg by mouth daily as needed.    . Multiple Vitamin (MULTIVITAMIN) tablet Take 1 tablet by mouth daily.    . Omega-3 Fatty Acids (FISH OIL) 1000 MG CAPS Take by mouth daily.    . Probiotic Product (PROBIOTIC PEARLS PO) Take 1 tablet by mouth daily.    . simvastatin (ZOCOR) 40 MG tablet TAKE 1 TABLET BY MOUTH EVERYDAY AT BEDTIME 30 tablet 3  . Turmeric 500 MG CAPS Take 500 mg by mouth daily.    . valACYclovir (VALTREX) 1000 MG tablet Take 1,000 mg by mouth as needed. 1/2 by mouth once daily     No current facility-administered medications on file prior to visit.    Past Medical History:  Diagnosis Date  . Allergy    RHINITIS  . Anxiety 1989   Dr Jake Michaelis, [place]  . Back injury   . Depression    Dr Toy Care  . Fibroids   . GERD 11/11/2009   Qualifier: Diagnosis of  By: Linna Darner MD, Gwyndolyn Saxon   Dysphagia once monthly on average 4-10/14 ; resolved with Prilosec OTC prn No PMH of endoscopy    . GERD (gastroesophageal reflux disease)   . Hyperlipidemia   . Hypertension     Past Surgical History:  Procedure Laterality Date  . ABDOMINAL HYSTERECTOMY     & USO  . ANAL FISTULA ECTOMY  X 2, Dr Rosana Hoes  . COLONOSCOPY  2004, 2015   Carlean Purl  . INJECTION KNEE Bilateral    [facility]  . MYOMECTOMY     FOR FIBROIDS, Dr Ubaldo Glassing  . steroid injection shoulder Right 05/11/2013   contusion/strain; Dr Onnie Graham    Social History   Socioeconomic History  . Marital status: Divorced    Spouse name: Not on file  . Number of children: Not on file  . Years of education: Not on file  . Highest education level: Not on file  Occupational History  . Occupation: Press photographer  Tobacco Use  . Smoking status: Never  . Smokeless tobacco: Never  Vaping Use  . Vaping Use: Never used  Substance and Sexual Activity  . Alcohol use: Yes    Comment: decreasing wine intake  . Drug use: No  . Sexual activity: Not on file  Other Topics Concern  . Not on file   Social History Narrative   GETS REG EXERCISE         Social Determinants of Health   Financial Resource Strain: Low Risk   . Difficulty of Paying Living Expenses: Not hard at all  Food Insecurity: No Food Insecurity  . Worried About Charity fundraiser in the Last Year: Never true  . Ran Out of Food in the Last Year: Never true  Transportation Needs: No Transportation Needs  . Lack of Transportation (Medical): No  . Lack of Transportation (Non-Medical): No  Physical Activity: Sufficiently Active  . Days of Exercise per Week: 5 days  . Minutes of Exercise per Session: 30 min  Stress: No Stress Concern Present  . Feeling of Stress : Not at all  Social Connections: Moderately Integrated  . Frequency of Communication with Friends and Family: More than three times a week  . Frequency of Social Gatherings with Friends and Family: More than three times a week  . Attends Religious Services: More than 4 times per year  . Active Member of Clubs or Organizations: Yes  . Attends Archivist Meetings: More than 4 times per year  . Marital Status: Never married    Family History  Problem Relation Age of Onset  . Arthritis Mother   . Stroke Mother 71  . Alcohol abuse Father   . Hyperlipidemia Sister   . Hypertension Sister   . Atrial fibrillation Sister   . Breast cancer Paternal Aunt   . Diabetes Maternal Grandmother   . Heart disease Paternal Grandmother        CAD; no MI  . Stroke Paternal Grandmother        in 26s  . Stroke Paternal Grandfather        in late 39s  . Colon cancer Neg Hx     Review of Systems     Objective:  There were no vitals filed for this visit. There were no vitals filed for this visit. There is no height or weight on file to calculate BMI.  BP Readings from Last 3 Encounters:  03/10/21 (!) 150/80  03/03/21 (!) 160/108  02/25/20 140/84    Wt Readings from Last 3 Encounters:  03/10/21 189 lb 3.2 oz (85.8 kg)  03/03/21 185 lb (83.9  kg)  02/25/20 184 lb 6.4 oz (83.6 kg)    Depression screen [facility] 2/9 01/21/2021 01/21/2020 12/13/2018 11/01/2017 10/21/2016  Decreased Interest 0 0 0 0 1  Down, Depressed, Hopeless 0 0 0 0 1  PHQ - 2 Score 0 0 0 0 2  Altered sleeping - - 0 1 1  Tired, decreased energy - - 0 0 0  Change in appetite - - 0 0 0  Feeling bad or failure about yourself  - - 0 0 0  Trouble concentrating - - 0 0 0  Moving slowly or fidgety/restless - - 0 0 0  Suicidal thoughts - - 0 0 0  PHQ-9 Score - - 0 1 3  Difficult doing work/chores - - Not difficult at all Not difficult at all Not difficult at all     No flowsheet data found.     Physical Exam Constitutional: She appears well-developed and well-nourished. No distress.  HENT:  Head: Normocephalic and atraumatic.  Right Ear: External ear normal. Normal ear canal and TM Left Ear: External ear normal.  Normal ear canal and TM Mouth/Throat: Oropharynx is clear and moist.  Eyes: Conjunctivae and EOM are normal.  Neck: Neck supple. No tracheal deviation present. No thyromegaly present.  No carotid bruit  Cardiovascular: Normal rate, regular rhythm and normal heart sounds.   No murmur heard.  No edema. Pulmonary/Chest: Effort normal and breath sounds normal. No respiratory distress. She has no wheezes. She has no rales.  Breast: deferred   Abdominal: Soft. She exhibits no distension. There is no tenderness.  Lymphadenopathy: She has no cervical adenopathy.  Skin: Skin is warm and dry. She is not diaphoretic.  Psychiatric: She has a normal mood and affect. Her behavior is normal.     Lab Results  Component Value Date   WBC 7.5 03/10/2021   HGB 13.8 03/10/2021   HCT 41.4 03/10/2021   PLT 198.0 03/10/2021   GLUCOSE 82 03/10/2021   CHOL 200 03/10/2021   TRIG 118.0 03/10/2021   HDL 69.10 03/10/2021   LDLDIRECT 113.0 01/20/2015   LDLCALC 107 (H) 03/10/2021   ALT 18 03/10/2021   AST 20 03/10/2021   NA 134 (L) 03/10/2021   K 4.0 03/10/2021   CL  100 03/10/2021   CREATININE 0.62 03/10/2021   BUN 25 (H) 03/10/2021   CO2 27 03/10/2021   TSH 2.01 03/10/2021   HGBA1C 5.9 03/10/2021         Assessment & Plan:   Physical exam: Screening blood work  ordered Exercise   Weight   Substance abuse  none   Reviewed recommended immunizations.   Health Maintenance  Topic Date Due  . Zoster Vaccines- Shingrix (1 of 2) Never done  . TETANUS/TDAP  11/12/2019  . MAMMOGRAM  12/14/2019  . COVID-19 Vaccine (4 - Booster for [place] series) 03/07/2020  . DEXA SCAN  12/02/2022  . COLONOSCOPY (Pts 45-34yrs Insurance coverage will need to be confirmed)  09/26/2023  . Pneumonia Vaccine 36+ Years old  Completed  . INFLUENZA VACCINE  Completed  . Hepatitis C Screening  Completed  . HPV VACCINES  Aged Out          See Problem List for Assessment and Plan of chronic medical problems.      This encounter was created in error - please disregard.

## 2021-02-25 NOTE — Patient Instructions (Addendum)
Blood work was ordered.     Medications changes include :     Your prescription(s) have been submitted to your pharmacy. Please take as directed and contact our office if you believe you are having problem(s) with the medication(s).   A referral was ordered for        Someone from their office will call you to schedule an appointment.    Please followup in 1 year    Health Maintenance, Female Adopting a healthy lifestyle and getting preventive care are important in promoting health and wellness. Ask your health care provider about: The right schedule for you to have regular tests and exams. Things you can do on your own to prevent diseases and keep yourself healthy. What should I know about diet, weight, and exercise? Eat a healthy diet  Eat a diet that includes plenty of vegetables, fruits, low-fat dairy products, and lean protein. Do not eat a lot of foods that are high in solid fats, added sugars, or sodium. Maintain a healthy weight Body mass index (BMI) is used to identify weight problems. It estimates body fat based on height and weight. Your health care provider can help determine your BMI and help you achieve or maintain a healthy weight. Get regular exercise Get regular exercise. This is one of the most important things you can do for your health. Most adults should: Exercise for at least 150 minutes each week. The exercise should increase your heart rate and make you sweat (moderate-intensity exercise). Do strengthening exercises at least twice a week. This is in addition to the moderate-intensity exercise. Spend less time sitting. Even light physical activity can be beneficial. Watch cholesterol and blood lipids Have your blood tested for lipids and cholesterol at 71 years of age, then have this test every 5 years. Have your cholesterol levels checked more often if: Your lipid or cholesterol levels are high. You are older than 71 years of age. You are at high risk for  heart disease. What should I know about cancer screening? Depending on your health history and family history, you may need to have cancer screening at various ages. This may include screening for: Breast cancer. Cervical cancer. Colorectal cancer. Skin cancer. Lung cancer. What should I know about heart disease, diabetes, and high blood pressure? Blood pressure and heart disease High blood pressure causes heart disease and increases the risk of stroke. This is more likely to develop in people who have high blood pressure readings or are overweight. Have your blood pressure checked: Every 3-5 years if you are 49-75 years of age. Every year if you are 61 years old or older. Diabetes Have regular diabetes screenings. This checks your fasting blood sugar level. Have the screening done: Once every three years after age 15 if you are at a normal weight and have a low risk for diabetes. More often and at a younger age if you are overweight or have a high risk for diabetes. What should I know about preventing infection? Hepatitis B If you have a higher risk for hepatitis B, you should be screened for this virus. Talk with your health care provider to find out if you are at risk for hepatitis B infection. Hepatitis C Testing is recommended for: Everyone born from [birth year] through 1966. Anyone with known risk factors for hepatitis C. Sexually transmitted infections (STIs) Get screened for STIs, including gonorrhea and chlamydia, if: You are sexually active and are younger than 71 years of age. You are older than  71 years of age and your health care provider tells you that you are at risk for this type of infection. Your sexual activity has changed since you were last screened, and you are at increased risk for chlamydia or gonorrhea. Ask your health care provider if you are at risk. Ask your health care provider about whether you are at high risk for HIV. Your health care provider may recommend a  prescription medicine to help prevent HIV infection. If you choose to take medicine to prevent HIV, you should first get tested for HIV. You should then be tested every 3 months for as long as you are taking the medicine. Pregnancy If you are about to stop having your period (premenopausal) and you may become pregnant, seek counseling before you get pregnant. Take 400 to 800 micrograms (mcg) of folic acid every day if you become pregnant. Ask for birth control (contraception) if you want to prevent pregnancy. Osteoporosis and menopause Osteoporosis is a disease in which the bones lose minerals and strength with aging. This can result in bone fractures. If you are 3 years old or older, or if you are at risk for osteoporosis and fractures, ask your health care provider if you should: Be screened for bone loss. Take a calcium or vitamin D supplement to lower your risk of fractures. Be given hormone replacement therapy (HRT) to treat symptoms of menopause. Follow these instructions at home: Alcohol use Do not drink alcohol if: Your health care provider tells you not to drink. You are pregnant, may be pregnant, or are planning to become pregnant. If you drink alcohol: Limit how much you have to: 0-1 drink a day. Know how much alcohol is in your drink. In the [place] one drink equals one 12 oz bottle of beer (355 mL), one 5 oz glass of wine (148 mL), or one 1 oz glass of hard liquor (44 mL). Lifestyle Do not use any products that contain nicotine or tobacco. These products include cigarettes, chewing tobacco, and vaping devices, such as e-cigarettes. If you need help quitting, ask your health care provider. Do not use street drugs. Do not share needles. Ask your health care provider for help if you need support or information about quitting drugs. General instructions Schedule regular health, dental, and eye exams. Stay current with your vaccines. Tell your health care provider if: You often  feel depressed. You have ever been abused or do not feel safe at home. Summary Adopting a healthy lifestyle and getting preventive care are important in promoting health and wellness. Follow your health care provider's instructions about healthy diet, exercising, and getting tested or screened for diseases. Follow your health care provider's instructions on monitoring your cholesterol and blood pressure. This information is not intended to replace advice given to you by your health care provider. Make sure you discuss any questions you have with your health care provider. Document Revised: 08/04/2020 Document Reviewed: 08/04/2020 Elsevier Patient Education  [place].

## 2021-02-26 ENCOUNTER — Encounter: Payer: PPO | Admitting: Internal Medicine

## 2021-02-26 DIAGNOSIS — R7301 Impaired fasting glucose: Secondary | ICD-10-CM

## 2021-02-26 DIAGNOSIS — I10 Essential (primary) hypertension: Secondary | ICD-10-CM

## 2021-02-26 DIAGNOSIS — E782 Mixed hyperlipidemia: Secondary | ICD-10-CM

## 2021-02-26 DIAGNOSIS — Z0000 Encounter for general adult medical examination without abnormal findings: Secondary | ICD-10-CM

## 2021-02-26 DIAGNOSIS — F3289 Other specified depressive episodes: Secondary | ICD-10-CM

## 2021-02-26 NOTE — Assessment & Plan Note (Signed)
Chronic Regular exercise and healthy diet encouraged Check lipid panel  Continue simvastatin 40 mg daily 

## 2021-02-26 NOTE — Assessment & Plan Note (Signed)
History of melanoma Sees dermatology regularly

## 2021-02-26 NOTE — Assessment & Plan Note (Signed)
Chronic Management per psychiatry 

## 2021-02-26 NOTE — Assessment & Plan Note (Signed)
Chronic Check a1c Low sugar / carb diet Stressed regular exercise  

## 2021-02-26 NOTE — Assessment & Plan Note (Signed)
Chronic Blood pressure well controlled CMP Continue hydrochlorothiazide 25 mg daily 

## 2021-02-27 ENCOUNTER — Ambulatory Visit: Payer: PPO | Admitting: Family Medicine

## 2021-03-03 ENCOUNTER — Ambulatory Visit: Payer: Self-pay

## 2021-03-03 ENCOUNTER — Encounter: Payer: Self-pay | Admitting: Family Medicine

## 2021-03-03 ENCOUNTER — Ambulatory Visit: Payer: PPO | Admitting: Family Medicine

## 2021-03-03 VITALS — BP 160/108 | Ht 67.0 in | Wt 185.0 lb

## 2021-03-03 DIAGNOSIS — M25411 Effusion, right shoulder: Secondary | ICD-10-CM | POA: Diagnosis not present

## 2021-03-03 DIAGNOSIS — M25511 Pain in right shoulder: Secondary | ICD-10-CM

## 2021-03-03 DIAGNOSIS — M75101 Unspecified rotator cuff tear or rupture of right shoulder, not specified as traumatic: Secondary | ICD-10-CM | POA: Insufficient documentation

## 2021-03-03 DIAGNOSIS — M12811 Other specific arthropathies, not elsewhere classified, right shoulder: Secondary | ICD-10-CM | POA: Insufficient documentation

## 2021-03-03 MED ORDER — GABAPENTIN 300 MG PO CAPS: 300.0000 mg | ORAL_CAPSULE | Freq: Three times a day (TID) | ORAL | 3 refills | Status: DC

## 2021-03-03 MED ORDER — METHYLPREDNISOLONE ACETATE 40 MG/ML IJ SUSP
40.0000 mg | Freq: Once | INTRAMUSCULAR | Status: AC
  Administered 2021-03-03: 40 mg via INTRA_ARTICULAR

## 2021-03-03 NOTE — Patient Instructions (Signed)
Good to see you Please try ice  Please try the exercises   Please send me a message in MyChart with any questions or updates.  Please see me back in 4-6 weeks or as needed if better.   --Dr. Raeford Razor

## 2021-03-03 NOTE — Assessment & Plan Note (Signed)
Appears to have irritation of the joint that is leading to the effusion of the different areas.  Has degenerative changes of the rotator cuff as well. -Counseled on home exercise therapy and supportive care. -Injection today. -Could consider physical therapy or shockwave.

## 2021-03-03 NOTE — Progress Notes (Signed)
Jasmine Bray - 70 y.o. female MRN [id number]  Date of birth: [birth year]  SUBJECTIVE:  Including CC & ROS.  No chief complaint on file.   Jasmine Bray is a 70 y.o. female that is presenting with acute right shoulder pain.  The pain is occurring over the anterior aspect.  She has been trying to get back to working out and notices pain with certain range of motion's overhead.    Review of Systems See HPI   HISTORY: Past Medical, Surgical, Social, and Family History Reviewed & Updated per EMR.   Pertinent Historical Findings include:  Past Medical History:  Diagnosis Date   Allergy    RHINITIS   Anxiety 1989   Dr Jake Michaelis, [place]   Back injury    Depression    Dr Toy Care   Fibroids    GERD 11/11/2009   Qualifier: Diagnosis of  By: Linna Darner MD, Gwyndolyn Saxon   Dysphagia once monthly on average 4-10/14 ; resolved with Prilosec OTC prn No PMH of endoscopy     GERD (gastroesophageal reflux disease)    Hyperlipidemia    Hypertension     Past Surgical History:  Procedure Laterality Date   ABDOMINAL HYSTERECTOMY     & USO   ANAL FISTULA ECTOMY      X 2, Dr Rosana Hoes   COLONOSCOPY  2004, 2015   [place] Bilateral    [facility]   MYOMECTOMY     FOR FIBROIDS, Dr Ubaldo Glassing   steroid injection shoulder Right 05/11/2013   contusion/strain; Dr Onnie Graham    Family History  Problem Relation Age of Onset   Arthritis Mother    Stroke Mother 63   Alcohol abuse Father    Hyperlipidemia Sister    Hypertension Sister    Atrial fibrillation Sister    Breast cancer Paternal Aunt    Diabetes Maternal Grandmother    Heart disease Paternal Grandmother        CAD; no MI   Stroke Paternal Grandmother        in 91s   Stroke Paternal Grandfather        in late 30s   Colon cancer Neg Hx     Social History   Socioeconomic History   Marital status: Divorced    Spouse name: Not on file   Number of children: Not on file   Years of education: Not on file   Highest education level: Not on  file  Occupational History   Occupation: Press photographer  Tobacco Use   Smoking status: Never   Smokeless tobacco: Never  Vaping Use   Vaping Use: Never used  Substance and Sexual Activity   Alcohol use: Yes    Comment: decreasing wine intake   Drug use: No   Sexual activity: Not on file  Other Topics Concern   Not on file  Social History Narrative   GETS REG EXERCISE         Social Determinants of Health   Financial Resource Strain: Low Risk    Difficulty of Paying Living Expenses: Not hard at all  Food Insecurity: No Food Insecurity   Worried About Charity fundraiser in the Last Year: Never true   [place] in the Last Year: Never true  Transportation Needs: No Transportation Needs   Lack of Transportation (Medical): No   Lack of Transportation (Non-Medical): No  Physical Activity: Sufficiently Active   Days of Exercise per Week: 5 days   Minutes of Exercise  per Session: 30 min  Stress: No Stress Concern Present   Feeling of Stress : Not at all  Social Connections: Moderately Integrated   Frequency of Communication with Friends and Family: More than three times a week   Frequency of Social Gatherings with Friends and Family: More than three times a week   Attends Religious Services: More than 4 times per year   Active Member of [employer] or Organizations: Yes   Attends Music therapist: More than 4 times per year   Marital Status: Never married  Human resources officer Violence: Not At Risk   Fear of Current or Ex-Partner: No   Emotionally Abused: No   Physically Abused: No   Sexually Abused: No     PHYSICAL EXAM:  VS: BP (!) 160/108 (BP Location: Left Arm, Patient Position: Sitting)   Ht 5\' 7"  (1.702 m)   Wt 185 lb (83.9 kg)   BMI 28.98 kg/m  Physical Exam Gen: NAD, alert, cooperative with exam, well-appearing   Limited ultrasound: Right shoulder:  Encircling effusion appreciated the biceps tendon sheath. There is a overlying bursitis of the  subscapularis. Mild degenerative changes of the supraspinatus with impingement appreciated on dynamic testing. Mild effusion posterior glenohumeral joint.  Summary: Effusion of the joint.  Ultrasound and interpretation by Clearance Coots, MD   Aspiration/Injection Procedure Note JEANNETTA CERUTTI 1950/04/20  Procedure: Injection Indications: Right shoulder pain  Procedure Details Consent: Risks of procedure as well as the alternatives and risks of each were explained to the (patient/caregiver).  Consent for procedure obtained. Time Out: Verified patient identification, verified procedure, site/side was marked, verified correct patient position, special equipment/implants available, medications/allergies/relevent history reviewed, required imaging and test results available.  Performed.  The area was cleaned with iodine and alcohol swabs.    The right biceps tendon sheath was injected using 1 cc's of 40 mg Depo-Medrol and 4 cc's of 0.25% bupivacaine with a 22 1 1/2" needle.  Ultrasound was used. Images were obtained in short views showing the injection.     A sterile dressing was applied.  Patient did tolerate procedure well.   ASSESSMENT & PLAN:   Effusion of joint of right shoulder Appears to have irritation of the joint that is leading to the effusion of the different areas.  Has degenerative changes of the rotator cuff as well. -Counseled on home exercise therapy and supportive care. -Injection today. -Could consider physical therapy or shockwave.

## 2021-03-09 NOTE — Patient Instructions (Addendum)
Blood work was ordered.     Medications changes include :  start amlodipine 5 mg daily for your BP   Your prescription(s) have been submitted to your pharmacy. Please take as directed and contact our office if you believe you are having problem(s) with the medication(s).   Please followup in 1 year   Health Maintenance, Female Adopting a healthy lifestyle and getting preventive care are important in promoting health and wellness. Ask your health care provider about: The right schedule for you to have regular tests and exams. Things you can do on your own to prevent diseases and keep yourself healthy. What should I know about diet, weight, and exercise? Eat a healthy diet  Eat a diet that includes plenty of vegetables, fruits, low-fat dairy products, and lean protein. Do not eat a lot of foods that are high in solid fats, added sugars, or sodium. Maintain a healthy weight Body mass index (BMI) is used to identify weight problems. It estimates body fat based on height and weight. Your health care provider can help determine your BMI and help you achieve or maintain a healthy weight. Get regular exercise Get regular exercise. This is one of the most important things you can do for your health. Most adults should: Exercise for at least 150 minutes each week. The exercise should increase your heart rate and make you sweat (moderate-intensity exercise). Do strengthening exercises at least twice a week. This is in addition to the moderate-intensity exercise. Spend less time sitting. Even light physical activity can be beneficial. Watch cholesterol and blood lipids Have your blood tested for lipids and cholesterol at 71 years of age, then have this test every 5 years. Have your cholesterol levels checked more often if: Your lipid or cholesterol levels are high. You are older than 71 years of age. You are at high risk for heart disease. What should I know about cancer  screening? Depending on your health history and family history, you may need to have cancer screening at various ages. This may include screening for: Breast cancer. Cervical cancer. Colorectal cancer. Skin cancer. Lung cancer. What should I know about heart disease, diabetes, and high blood pressure? Blood pressure and heart disease High blood pressure causes heart disease and increases the risk of stroke. This is more likely to develop in people who have high blood pressure readings or are overweight. Have your blood pressure checked: Every 3-5 years if you are 72-14 years of age. Every year if you are 19 years old or older. Diabetes Have regular diabetes screenings. This checks your fasting blood sugar level. Have the screening done: Once every three years after age 79 if you are at a normal weight and have a low risk for diabetes. More often and at a younger age if you are overweight or have a high risk for diabetes. What should I know about preventing infection? Hepatitis B If you have a higher risk for hepatitis B, you should be screened for this virus. Talk with your health care provider to find out if you are at risk for hepatitis B infection. Hepatitis C Testing is recommended for: Everyone born from [birth year] through 1966. Anyone with known risk factors for hepatitis C. Sexually transmitted infections (STIs) Get screened for STIs, including gonorrhea and chlamydia, if: You are sexually active and are younger than 71 years of age. You are older than 71 years of age and your health care provider tells you that you are at risk for this type  of infection. Your sexual activity has changed since you were last screened, and you are at increased risk for chlamydia or gonorrhea. Ask your health care provider if you are at risk. Ask your health care provider about whether you are at high risk for HIV. Your health care provider may recommend a prescription medicine to help prevent HIV  infection. If you choose to take medicine to prevent HIV, you should first get tested for HIV. You should then be tested every 3 months for as long as you are taking the medicine. Pregnancy If you are about to stop having your period (premenopausal) and you may become pregnant, seek counseling before you get pregnant. Take 400 to 800 micrograms (mcg) of folic acid every day if you become pregnant. Ask for birth control (contraception) if you want to prevent pregnancy. Osteoporosis and menopause Osteoporosis is a disease in which the bones lose minerals and strength with aging. This can result in bone fractures. If you are 34 years old or older, or if you are at risk for osteoporosis and fractures, ask your health care provider if you should: Be screened for bone loss. Take a calcium or vitamin D supplement to lower your risk of fractures. Be given hormone replacement therapy (HRT) to treat symptoms of menopause. Follow these instructions at home: Alcohol use Do not drink alcohol if: Your health care provider tells you not to drink. You are pregnant, may be pregnant, or are planning to become pregnant. If you drink alcohol: Limit how much you have to: 0-1 drink a day. Know how much alcohol is in your drink. In the [place] one drink equals one 12 oz bottle of beer (355 mL), one 5 oz glass of wine (148 mL), or one 1 oz glass of hard liquor (44 mL). Lifestyle Do not use any products that contain nicotine or tobacco. These products include cigarettes, chewing tobacco, and vaping devices, such as e-cigarettes. If you need help quitting, ask your health care provider. Do not use street drugs. Do not share needles. Ask your health care provider for help if you need support or information about quitting drugs. General instructions Schedule regular health, dental, and eye exams. Stay current with your vaccines. Tell your health care provider if: You often feel depressed. You have ever been abused  or do not feel safe at home. Summary Adopting a healthy lifestyle and getting preventive care are important in promoting health and wellness. Follow your health care provider's instructions about healthy diet, exercising, and getting tested or screened for diseases. Follow your health care provider's instructions on monitoring your cholesterol and blood pressure. This information is not intended to replace advice given to you by your health care provider. Make sure you discuss any questions you have with your health care provider. Document Revised: 08/04/2020 Document Reviewed: 08/04/2020 Elsevier Patient Education  [place].

## 2021-03-09 NOTE — Progress Notes (Signed)
Subjective:    Patient ID: Jasmine Bray, female    DOB: [birth year], 70 y.o.   MRN: [id number]   This visit occurred during the SARS-CoV-2 public health emergency.  Safety protocols were in place, including screening questions prior to the visit, additional usage of staff PPE, and extensive cleaning of exam room while observing appropriate contact time as indicated for disinfecting solutions.    HPI She is here for a physical exam.   BP at home 150's /80's on average - sometimes really good and sometimes high. She is taking all of her medications as prescribed.     Medications and allergies reviewed with patient and updated if appropriate.  Patient Active Problem List   Diagnosis Date Noted   Effusion of joint of right shoulder 03/03/2021   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   Depression 09/06/2019   History of melanoma 02/20/2019   Osteoarthritis 02/20/2019   Primary osteoarthritis of right knee 07/18/2018   Sciatica of left side 10/10/2017   Chronic knee pain 07/25/2017   Left upper arm pain 03/17/2016   Hepatitis C antibody test positive - cleared 01/29/2016   Acute pain of right shoulder 04/24/2013   Fasting hyperglycemia 01/10/2012   Hyperlipidemia 11/11/2009   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     aspirin EC 81 MG tablet Take 81 mg by mouth daily.     Biotin 1000 MCG tablet      buPROPion (WELLBUTRIN XL) 150 MG 24 hr tablet Take 150 mg by mouth daily.     Cholecalciferol (VITAMIN D3) 3000 UNITS TABS Take 1 capsule by mouth daily. Taking 1000 units daily     escitalopram (LEXAPRO) 20 MG tablet Take 20 mg by mouth daily.     estradiol (ESTRACE) 0.1 MG/GM vaginal cream Place 2  g vaginally. 1 GRAM 3x A WEEK      estradiol (EVAMIST) 1.53 MG/SPRAY transdermal spray Evamist 1.53 mg/spray (1.7 %) transdermal spray  APPLY 1 SPRAY DAILY AS DIRECTED     gabapentin (NEURONTIN) 300 MG capsule Take 1 capsule (300 mg total) by mouth 3 (three) times daily. 180 capsule 3   hydrochlorothiazide (HYDRODIURIL) 25 MG tablet Take 1 tablet (25 mg total) by mouth daily. 90 tablet 3   LORazepam (ATIVAN) 1 MG tablet Take 1 mg by mouth daily as needed.     Multiple Vitamin (MULTIVITAMIN) tablet Take 1 tablet by mouth daily.     Omega-3 Fatty Acids (FISH OIL) 1000 MG CAPS Take by mouth daily.     Probiotic Product (PROBIOTIC PEARLS PO) Take 1 tablet by mouth daily.     simvastatin (ZOCOR) 40 MG tablet TAKE 1 TABLET BY MOUTH EVERYDAY AT BEDTIME 30 tablet 3   Turmeric 500 MG CAPS Take 500 mg by mouth daily.     valACYclovir (VALTREX) 1000 MG tablet Take 1,000 mg by mouth as needed. 1/2 by mouth once daily     No current facility-administered medications on file prior to visit.    Past Medical History:  Diagnosis Date   Allergy    RHINITIS   Anxiety 1989   Dr Jake Michaelis, FP   Back injury    Depression    Dr Toy Care  Fibroids    GERD 11/11/2009   Qualifier: Diagnosis of  By: Linna Darner MD, Gwyndolyn Saxon   Dysphagia once monthly on average 4-10/14 ; resolved with Prilosec OTC prn No PMH of endoscopy     GERD (gastroesophageal reflux disease)    Hyperlipidemia    Hypertension     Past Surgical History:  Procedure Laterality Date   ABDOMINAL HYSTERECTOMY     & USO   ANAL FISTULA ECTOMY      X 2, Dr Rosana Hoes   COLONOSCOPY  2004, 2015   [place] Bilateral    [facility]   MYOMECTOMY     FOR FIBROIDS, Dr Ubaldo Glassing   steroid injection shoulder Right 05/11/2013   contusion/strain; Dr Onnie Graham    Social History   Socioeconomic History   Marital status: Divorced    Spouse name: Not on file   Number of children: Not on file   Years of education: Not on file   Highest education level: Not  on file  Occupational History   Occupation: SALES  Tobacco Use   Smoking status: Never   Smokeless tobacco: Never  Vaping Use   Vaping Use: Never used  Substance and Sexual Activity   Alcohol use: Yes    Comment: decreasing wine intake   Drug use: No   Sexual activity: Not on file  Other Topics Concern   Not on file  Social History Narrative   GETS REG EXERCISE         Social Determinants of Health   Financial Resource Strain: Low Risk    Difficulty of Paying Living Expenses: Not hard at all  Food Insecurity: No Food Insecurity   Worried About Charity fundraiser in the Last Year: Never true   [place] in the Last Year: Never true  Transportation Needs: No Transportation Needs   Lack of Transportation (Medical): No   Lack of Transportation (Non-Medical): No  Physical Activity: Sufficiently Active   Days of Exercise per Week: 5 days   Minutes of Exercise per Session: 30 min  Stress: No Stress Concern Present   Feeling of Stress : Not at all  Social Connections: Moderately Integrated   Frequency of Communication with Friends and Family: More than three times a week   Frequency of Social Gatherings with Friends and Family: More than three times a week   Attends Religious Services: More than 4 times per year   Active Member of [employer] or Organizations: Yes   Attends Music therapist: More than 4 times per year   Marital Status: Never married    Family History  Problem Relation Age of Onset   Arthritis Mother    Stroke Mother 38   Alcohol abuse Father    Hyperlipidemia Sister    Hypertension Sister    Atrial fibrillation Sister    Breast cancer Paternal Aunt    Diabetes Maternal Grandmother    Heart disease Paternal Grandmother        CAD; no MI   Stroke Paternal Grandmother        in 56s   Stroke Paternal Grandfather        in late 83s   Colon cancer Neg Hx     Review of Systems  Constitutional:  Negative for fever.  Eyes:  Negative for  visual disturbance.  Respiratory:  Negative for cough, shortness of breath and wheezing.   Cardiovascular:  Negative for chest pain, palpitations and leg swelling.  Gastrointestinal:  Negative  for abdominal pain, blood in stool, constipation, diarrhea and nausea.       No gerd  Genitourinary:  Negative for dysuria.  Musculoskeletal:  Positive for arthralgias.  Skin:  Negative for rash.  Neurological:  Positive for headaches (occ). Negative for dizziness and light-headedness.  Psychiatric/Behavioral:  Positive for dysphoric mood. The patient is nervous/anxious.       Objective:   Vitals:   03/10/21 1435  BP: (!) 150/80  Pulse: (!) 58  Temp: 98 F (36.7 C)  SpO2: 99%   Filed Weights   03/10/21 1435  Weight: 189 lb 3.2 oz (85.8 kg)   Body mass index is 29.63 kg/m.  BP Readings from Last 3 Encounters:  03/10/21 (!) 150/80  03/03/21 (!) 160/108  02/25/20 140/84    Wt Readings from Last 3 Encounters:  03/10/21 189 lb 3.2 oz (85.8 kg)  03/03/21 185 lb (83.9 kg)  02/25/20 184 lb 6.4 oz (83.6 kg)    Depression screen [facility] 2/9 01/21/2021 01/21/2020 12/13/2018 11/01/2017 10/21/2016  Decreased Interest 0 0 0 0 1  Down, Depressed, Hopeless 0 0 0 0 1  PHQ - 2 Score 0 0 0 0 2  Altered sleeping - - 0 1 1  Tired, decreased energy - - 0 0 0  Change in appetite - - 0 0 0  Feeling bad or failure about yourself  - - 0 0 0  Trouble concentrating - - 0 0 0  Moving slowly or fidgety/restless - - 0 0 0  Suicidal thoughts - - 0 0 0  PHQ-9 Score - - 0 1 3  Difficult doing work/chores - - Not difficult at all Not difficult at all Not difficult at all     No flowsheet data found.     Physical Exam Constitutional: She appears well-developed and well-nourished. No distress.  HENT:  Head: Normocephalic and atraumatic.  Right Ear: External ear normal. Normal ear canal and TM Left Ear: External ear normal.  Normal ear canal and TM Mouth/Throat: Oropharynx is clear and moist.  Eyes:  Conjunctivae and EOM are normal.  Neck: Neck supple. No tracheal deviation present. No thyromegaly present.  No carotid bruit  Cardiovascular: Normal rate, regular rhythm and normal heart sounds.   No murmur heard.  No edema. Pulmonary/Chest: Effort normal and breath sounds normal. No respiratory distress. She has no wheezes. She has no rales.  Breast: deferred   Abdominal: Soft. She exhibits no distension. There is no tenderness.  Lymphadenopathy: She has no cervical adenopathy.  Skin: Skin is warm and dry. She is not diaphoretic.  Psychiatric: She has a normal mood and affect. Her behavior is normal.     Lab Results  Component Value Date   WBC 9.7 02/25/2020   HGB 13.5 02/25/2020   HCT 38.9 02/25/2020   PLT 222.0 02/25/2020   GLUCOSE 95 02/25/2020   CHOL 215 (H) 02/25/2020   TRIG 162.0 (H) 02/25/2020   HDL 73.70 02/25/2020   LDLDIRECT 113.0 01/20/2015   LDLCALC 109 (H) 02/25/2020   ALT 25 02/25/2020   AST 23 02/25/2020   NA 136 02/25/2020   K 3.8 02/25/2020   CL 99 02/25/2020   CREATININE 0.64 02/25/2020   BUN 19 02/25/2020   CO2 27 02/25/2020   TSH 1.59 02/25/2020   HGBA1C 5.6 02/25/2020         Assessment & Plan:   Physical exam: Screening blood work  ordered Exercise  very active, working on increasing her exercise Weight  working  on weight loss Substance abuse  none   Reviewed recommended immunizations.   Health Maintenance  Topic Date Due   Zoster Vaccines- Shingrix (1 of 2) Never done   TETANUS/TDAP  11/12/2019   MAMMOGRAM  12/14/2019   COVID-19 Vaccine (4 - Booster for Pfizer series) 03/07/2020   DEXA SCAN  12/02/2022   COLONOSCOPY (Pts 45-74yrs Insurance coverage will need to be confirmed)  09/26/2023   Pneumonia Vaccine 84+ Years old  Completed   INFLUENZA VACCINE  Completed   Hepatitis C Screening  Completed   HPV VACCINES  Aged Out          See Problem List for Assessment and Plan of chronic medical problems.

## 2021-03-10 ENCOUNTER — Ambulatory Visit (INDEPENDENT_AMBULATORY_CARE_PROVIDER_SITE_OTHER): Payer: PPO | Admitting: Internal Medicine

## 2021-03-10 ENCOUNTER — Other Ambulatory Visit: Payer: Self-pay

## 2021-03-10 ENCOUNTER — Encounter: Payer: Self-pay | Admitting: Internal Medicine

## 2021-03-10 VITALS — BP 150/80 | HR 58 | Temp 98.0°F | Ht 67.0 in | Wt 189.2 lb

## 2021-03-10 DIAGNOSIS — I10 Essential (primary) hypertension: Secondary | ICD-10-CM | POA: Diagnosis not present

## 2021-03-10 DIAGNOSIS — Z85820 Personal history of malignant melanoma of skin: Secondary | ICD-10-CM | POA: Diagnosis not present

## 2021-03-10 DIAGNOSIS — E782 Mixed hyperlipidemia: Secondary | ICD-10-CM

## 2021-03-10 DIAGNOSIS — R7301 Impaired fasting glucose: Secondary | ICD-10-CM | POA: Diagnosis not present

## 2021-03-10 DIAGNOSIS — F3289 Other specified depressive episodes: Secondary | ICD-10-CM | POA: Diagnosis not present

## 2021-03-10 DIAGNOSIS — Z0000 Encounter for general adult medical examination without abnormal findings: Secondary | ICD-10-CM | POA: Diagnosis not present

## 2021-03-10 LAB — CBC WITH DIFFERENTIAL/PLATELET
Basophils Absolute: 0 10*3/uL (ref 0.0–0.1)
Basophils Relative: 0.6 % (ref 0.0–3.0)
Eosinophils Absolute: 0.3 10*3/uL (ref 0.0–0.7)
Eosinophils Relative: 4 % (ref 0.0–5.0)
HCT: 41.4 % (ref 36.0–46.0)
Hemoglobin: 13.8 g/dL (ref 12.0–15.0)
Lymphocytes Relative: 37.9 % (ref 12.0–46.0)
Lymphs Abs: 2.8 10*3/uL (ref 0.7–4.0)
MCHC: 33.4 g/dL (ref 30.0–36.0)
MCV: 94.4 fl (ref 78.0–100.0)
Monocytes Absolute: 0.6 10*3/uL (ref 0.1–1.0)
Monocytes Relative: 7.6 % (ref 3.0–12.0)
Neutro Abs: 3.7 10*3/uL (ref 1.4–7.7)
Neutrophils Relative %: 49.9 % (ref 43.0–77.0)
Platelets: 198 10*3/uL (ref 150.0–400.0)
RBC: 4.39 Mil/uL (ref 3.87–5.11)
RDW: 13.2 % (ref 11.5–15.5)
WBC: 7.5 10*3/uL (ref 4.0–10.5)

## 2021-03-10 LAB — COMPREHENSIVE METABOLIC PANEL
ALT: 18 U/L (ref 0–35)
AST: 20 U/L (ref 0–37)
Albumin: 4.3 g/dL (ref 3.5–5.2)
Alkaline Phosphatase: 55 U/L (ref 39–117)
BUN: 25 mg/dL — ABNORMAL HIGH (ref 6–23)
CO2: 27 mEq/L (ref 19–32)
Calcium: 9.4 mg/dL (ref 8.4–10.5)
Chloride: 100 mEq/L (ref 96–112)
Creatinine, Ser: 0.62 mg/dL (ref 0.40–1.20)
GFR: 89.82 mL/min (ref >60.00)
Glucose, Bld: 82 mg/dL (ref 70–99)
Potassium: 4 mEq/L (ref 3.5–5.1)
Sodium: 134 mEq/L — ABNORMAL LOW (ref 135–145)
Total Bilirubin: 0.5 mg/dL (ref 0.2–1.2)
Total Protein: 7.5 g/dL (ref 6.0–8.3)

## 2021-03-10 LAB — LIPID PANEL
Cholesterol: 200 mg/dL (ref 0–200)
HDL: 69.1 mg/dL (ref >39.00)
LDL Cholesterol: 107 mg/dL — ABNORMAL HIGH (ref 0–99)
NonHDL: 130.48
Total CHOL/HDL Ratio: 3
Triglycerides: 118 mg/dL (ref 0.0–149.0)
VLDL: 23.6 mg/dL (ref 0.0–40.0)

## 2021-03-10 LAB — HEMOGLOBIN A1C: Hgb A1c MFr Bld: 5.9 % (ref 4.6–6.5)

## 2021-03-10 LAB — TSH: TSH: 2.01 u[IU]/mL (ref 0.35–5.50)

## 2021-03-10 MED ORDER — AMLODIPINE BESYLATE 5 MG PO TABS: 5.0000 mg | ORAL_TABLET | Freq: Every day | ORAL | 1 refills | Status: DC

## 2021-03-10 NOTE — Assessment & Plan Note (Signed)
Chronic Controlled Management per Dr Toy Care

## 2021-03-10 NOTE — Assessment & Plan Note (Addendum)
Chronic BP not well controlled Continue hctz 25 mg daily Start amlodipine 5 mg daily cmp Monitor BP at home - goal < 140/90 consistently She will update me via [employer]

## 2021-03-10 NOTE — Assessment & Plan Note (Signed)
Chronic Check lipid panel  Continue simvastatin 40 mg daily Regular exercise and healthy diet encouraged  

## 2021-03-10 NOTE — Assessment & Plan Note (Signed)
Chronic Check a1c Low sugar / carb diet Stressed regular exercise  

## 2021-03-10 NOTE — Assessment & Plan Note (Signed)
Sees derm Q 6 months 

## 2021-03-11 ENCOUNTER — Encounter: Payer: Self-pay | Admitting: Internal Medicine

## 2021-03-31 DIAGNOSIS — B001 Herpesviral vesicular dermatitis: Secondary | ICD-10-CM | POA: Diagnosis not present

## 2021-03-31 DIAGNOSIS — Z79890 Hormone replacement therapy (postmenopausal): Secondary | ICD-10-CM | POA: Diagnosis not present

## 2021-03-31 DIAGNOSIS — N952 Postmenopausal atrophic vaginitis: Secondary | ICD-10-CM | POA: Diagnosis not present

## 2021-03-31 DIAGNOSIS — Z780 Asymptomatic menopausal state: Secondary | ICD-10-CM | POA: Diagnosis not present

## 2021-03-31 DIAGNOSIS — Z1231 Encounter for screening mammogram for malignant neoplasm of breast: Secondary | ICD-10-CM | POA: Diagnosis not present

## 2021-03-31 DIAGNOSIS — Z01419 Encounter for gynecological examination (general) (routine) without abnormal findings: Secondary | ICD-10-CM | POA: Diagnosis not present

## 2021-03-31 LAB — HM MAMMOGRAPHY

## 2021-04-01 ENCOUNTER — Telehealth: Payer: Self-pay

## 2021-04-01 NOTE — Progress Notes (Signed)
° ° °  Chronic Care Management Pharmacy Assistant   Name: Jasmine Bray  MRN: [id number] DOB: 1950-10-16  Reason for Encounter: Disease State-General Adherence Appointment: Telephone 05/15/21 @ 10 am   Recent office visits:  03/10/21 Quay Burow (PCP) - Annual exam. Start Norvasc 5 mg. Increase Gabapentin 300 mg.    Recent consult visits:  03/03/21 Raeford Razor (Sports Med) - Effusion of joint of right shoulder. Start Gabapentin 300 mg. DEPO-MEDROL injection 40 mg.  01/13/21 Wynetta Emery (Dermatology) - Other seborrheic keratosis. No med changes.  Hospital visits:  None in previous 6 months  Medications: Outpatient Encounter Medications as of 04/01/2021  Medication Sig Note   ALPRAZolam (XANAX) 1 MG tablet Take 1 mg by mouth 2 (two) times daily as needed.    amLODipine (NORVASC) 5 MG tablet Take 1 tablet (5 mg total) by mouth daily.    aspirin EC 81 MG tablet Take 81 mg by mouth daily.    Biotin 1000 MCG tablet     buPROPion (WELLBUTRIN XL) 150 MG 24 hr tablet Take 150 mg by mouth daily. 01/10/2013: Dr Toy Care   Cholecalciferol (VITAMIN D3) 3000 UNITS TABS Take 1 capsule by mouth daily. Taking 1000 units daily    escitalopram (LEXAPRO) 20 MG tablet Take 20 mg by mouth daily.    estradiol (ESTRACE) 0.1 MG/GM vaginal cream Place 2 g vaginally. 1 GRAM 3x A WEEK  01/10/2013: Sherolyn Buba ,Gyn NP   estradiol (EVAMIST) 1.53 MG/SPRAY transdermal spray Evamist 1.53 mg/spray (1.7 %) transdermal spray  APPLY 1 SPRAY DAILY AS DIRECTED    gabapentin (NEURONTIN) 300 MG capsule Take 1 capsule (300 mg total) by mouth 3 (three) times daily.    hydrochlorothiazide (HYDRODIURIL) 25 MG tablet Take 1 tablet (25 mg total) by mouth daily.    LORazepam (ATIVAN) 1 MG tablet Take 1 mg by mouth daily as needed.    Multiple Vitamin (MULTIVITAMIN) tablet Take 1 tablet by mouth daily.    Omega-3 Fatty Acids (FISH OIL) 1000 MG CAPS Take by mouth daily.    Probiotic Product (PROBIOTIC PEARLS PO) Take 1 tablet by mouth daily.     simvastatin (ZOCOR) 40 MG tablet TAKE 1 TABLET BY MOUTH EVERYDAY AT BEDTIME    Turmeric 500 MG CAPS Take 500 mg by mouth daily.    valACYclovir (VALTREX) 1000 MG tablet Take 1,000 mg by mouth as needed. 1/2 by mouth once daily 01/10/2013: Sherolyn Buba , Concha [place] NP 1/2 pill /day   No facility-administered encounter medications on file as of 04/01/2021.    Reviewed chart for medication changes and drug therapy problems ahead of medication adherence call.  Attempted to contact patient  for medication review and health check, unable to reach patient, left voicemails to return call.  Care Gaps Colonoscopy - 01/21/21 (Cologuard) Diabetic Foot Exam - NA Mammogram - NA Ophthalmology - NA Dexa Scan - 12/01/2017 Annual Well Visit - 03/10/21 Micro albumin - NA Hemoglobin A1c -  03/10/21  Star Rating Drugs: Simvastatin - last fill 11/10/20 [place], [place] Clinical Pharmacists Assistant [phone]

## 2021-04-02 ENCOUNTER — Other Ambulatory Visit: Payer: Self-pay | Admitting: Internal Medicine

## 2021-04-18 IMAGING — MR MR LUMBAR SPINE W/O CM
4 of 5 series · 25 of 48 positions shown · non-contrast
Comparison: Radiographs of the lumbar spine 10/16/2019.

CLINICAL DATA: Disease of spinal cord. Myelopathy, acute or
progressive. Additional history provided by scanning technologist:
Patient reports low back pain, muscle spasms and buttocks and thighs
after fall 2-3 months ago.

EXAM:
MRI LUMBAR SPINE WITHOUT CONTRAST
TECHNIQUE: Multiplanar, multisequence MR imaging of the lumbar spine was
performed. No intravenous contrast was administered.

[Series 2: T2 · sagittal · 4.0mm · 1.09mm/px · 6 of 16 slices shown (1 of 2)]
[im 1/16]
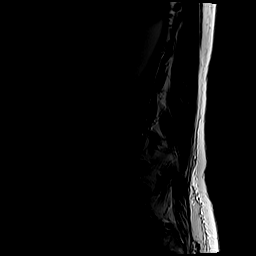
[im 4/16]
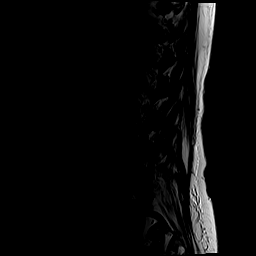
[im 7/16]
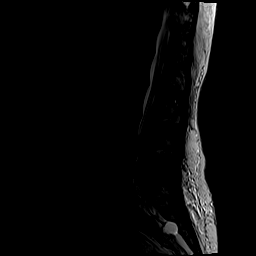
[im 10/16]
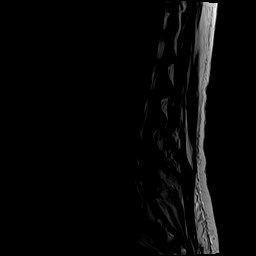
[im 13/16]
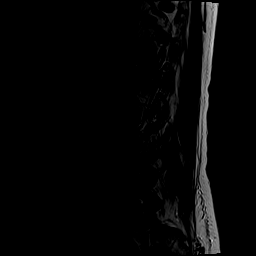
[im 16/16]
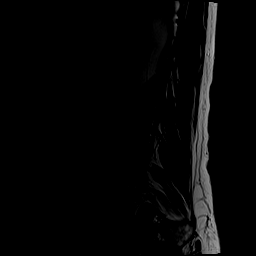

[Series 4: T1 · sagittal · 4.0mm · 1.09mm/px · 6 of 16 slices shown (1 of 2)]
[im 1/16]
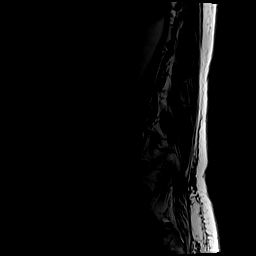
[im 4/16]
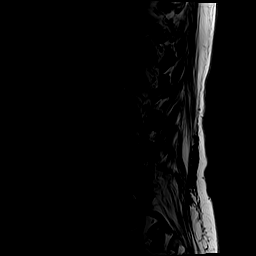
[im 7/16]
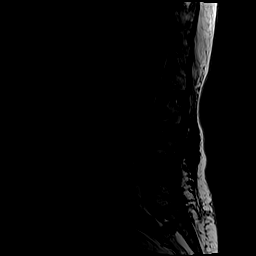
[im 10/16]
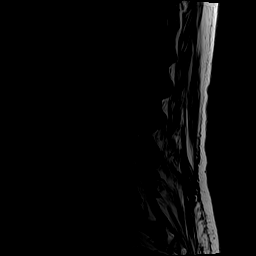
[im 13/16]
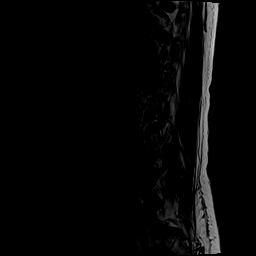
[im 16/16]
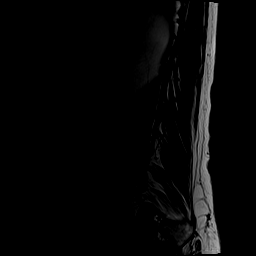

[Series 5: T2 · axial · 4.0mm · 0.39mm/px · z∈[-27,+193]mm · 9 of 43 slices shown (2 of 2)]
[im 1/43]
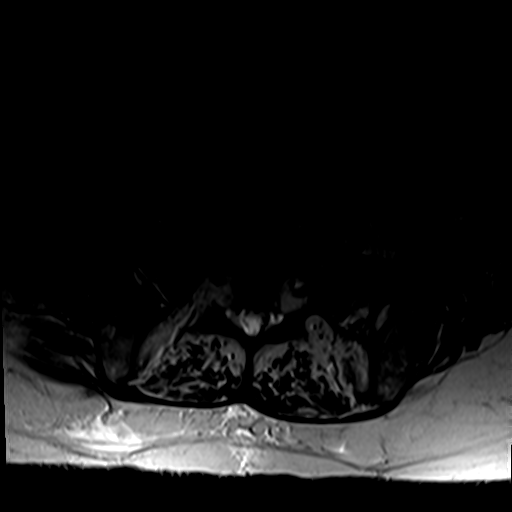
[im 7/43]
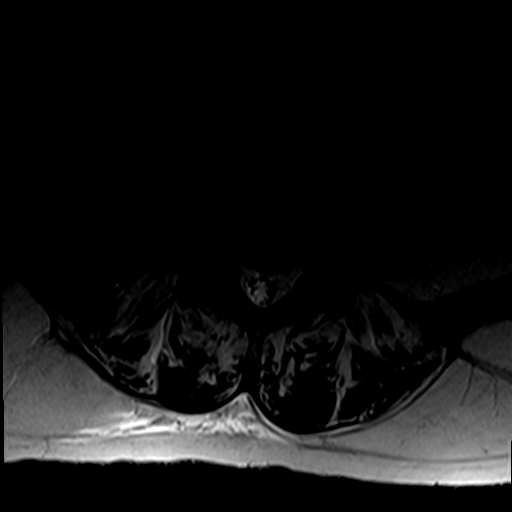
[im 13/43]
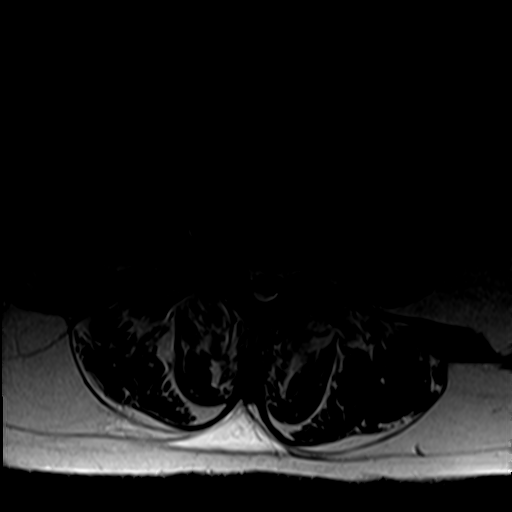
[im 19/43]
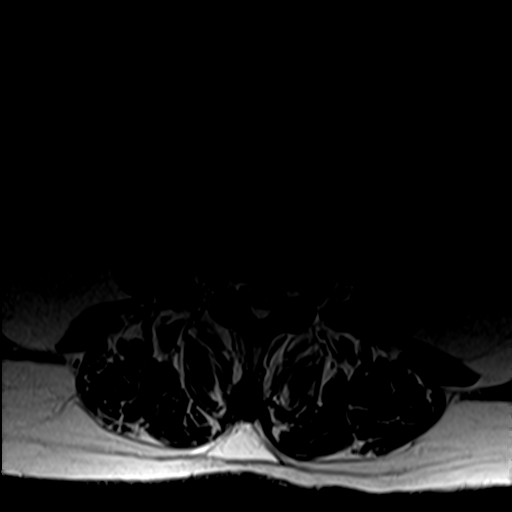
[im 22/43]
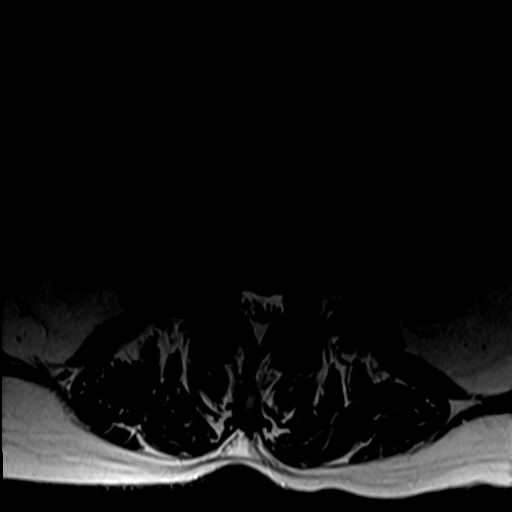
[im 25/43]
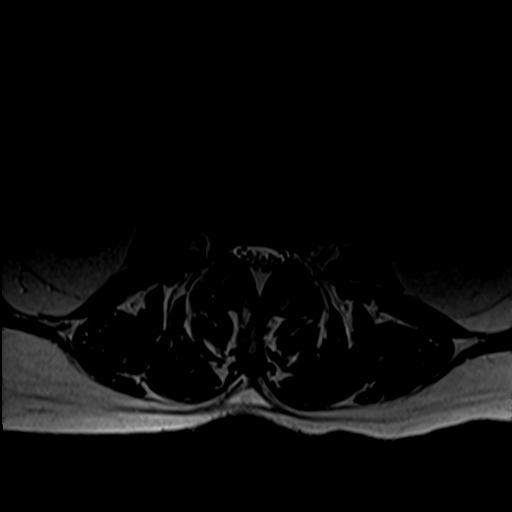
[im 31/43]
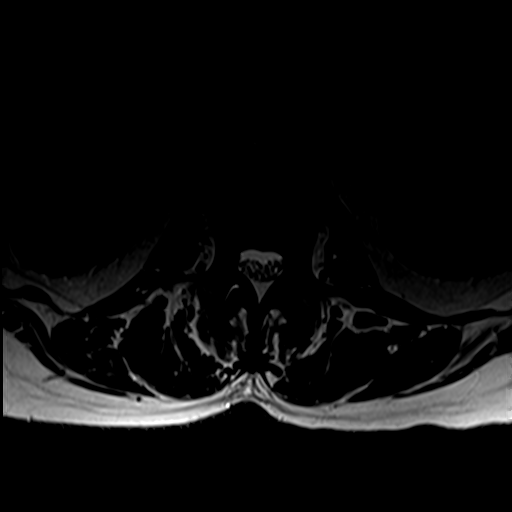
[im 37/43]
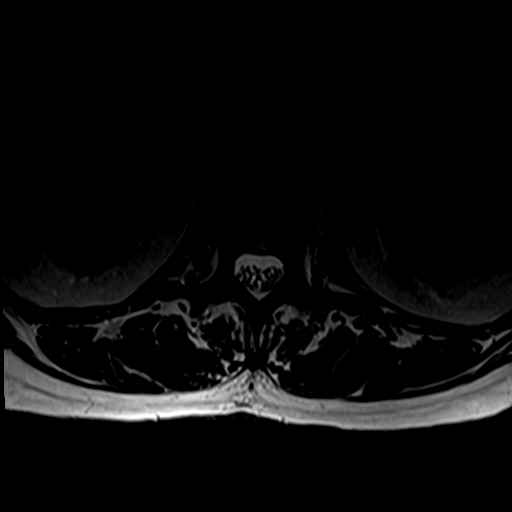
[im 43/43]
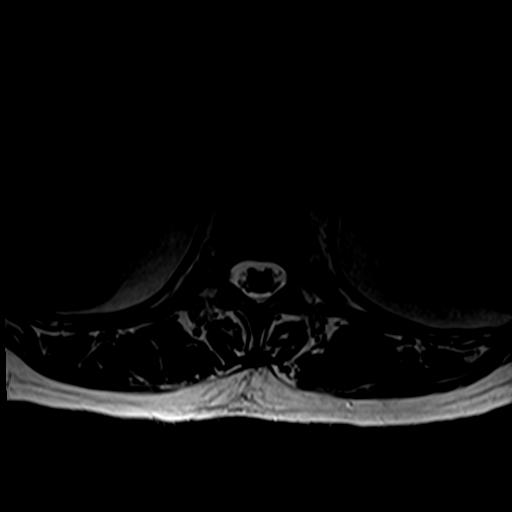

[Series 6: T1 · axial · 4.0mm · 0.39mm/px · z∈[-27,+164]mm · 4 of 43 slices shown (2 of 2)]
[im 1/43]
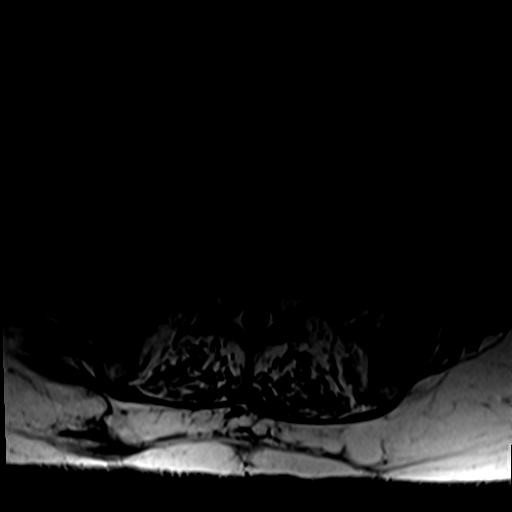
[im 7/43]
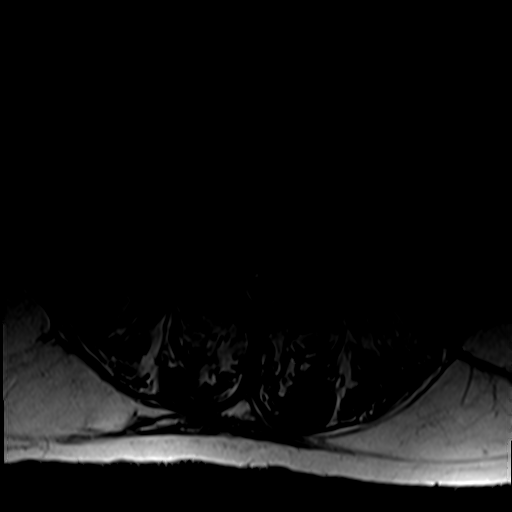
[im 22/43]
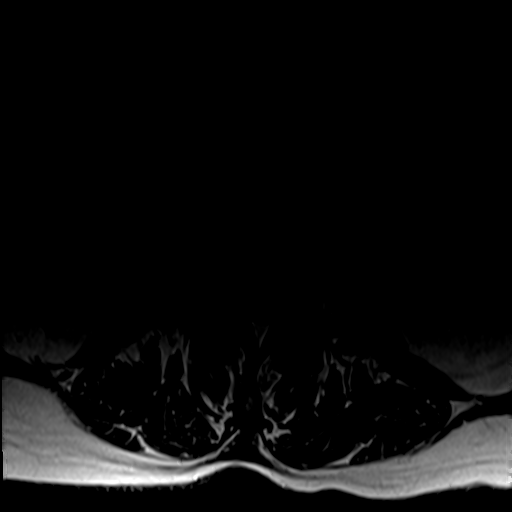
[im 37/43]
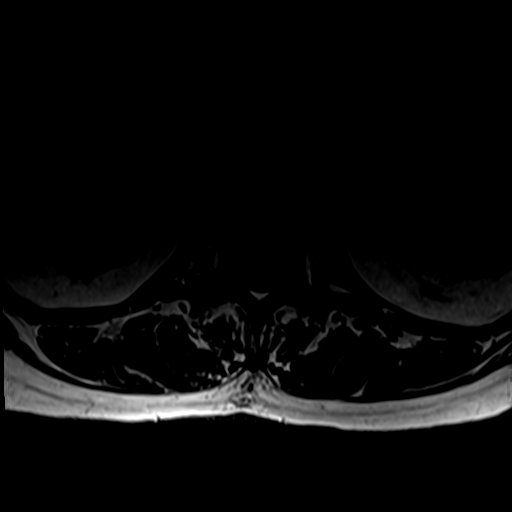

[25 of 48 positions shown; findings below may reference images not displayed]

FINDINGS: Segmentation: 5 lumbar vertebrae in correlating with prior lumbar
spine radiographs.

Alignment:  No significant spondylolisthesis.

Vertebrae: Vertebral body height is maintained. Mild multilevel
fatty degenerative endplate marrow signal, greatest at L5-S1. Mild
degenerative endplate irregularity at L4-L5 and L5-S1. Small Schmorl
nodes at L4-L5. Multilevel ventrolateral osteophytes. Several small
vertebral body hemangiomas.

Conus medullaris and cauda equina: Conus extends to the L1 level. No
signal abnormality within the visualized distal spinal cord. Small
Tarlov cysts.

Paraspinal and other soft tissues: No abnormality identified within
included portions of the abdomen/retroperitoneum. Atrophy of the
lumbar paraspinal musculature.

Disc levels:

Multilevel disc degeneration. Most notably, there is moderate disc
degeneration at L4-L5 and moderate/advanced disc degeneration at
L5-S1.

Mild congenital narrowing of the lumbar spinal canal.

T11-T12: This level is imaged sagittally. Mild facet
arthrosis/ligamentum flavum hypertrophy. No significant spinal canal
or foraminal stenosis.

T12-L1: No significant disc herniation or stenosis.

L1-L2: Small disc bulge. Prominence of the dorsal epidural fat. No
significant spinal canal stenosis or neural foraminal narrowing.

L2-L3: Small disc bulge. Prominence of the dorsal epidural fat. No
significant spinal canal stenosis or neural foraminal narrowing

L3-L4: Disc bulge. Superimposed central disc extrusion with mild
cephalad migration to the level of the lower L3 vertebral body. Mild
facet arthrosis/ligamentum flavum hypertrophy. Small bilateral facet
joint effusions. Prominence of the dorsal epidural fat. Severe
spinal canal stenosis with likely impingement of the descending
cauda equina nerve roots (series 5, image 26). No significant
foraminal stenosis.

L4-L5: Disc bulge with endplate spurring. Moderate facet
arthrosis/ligamentum flavum hypertrophy. Epidural lipomatosis.
Moderate/severe effacement of the thecal sac. However, epidural
lipomatosis contributes significantly to thecal sac effacement.
Bilateral neural foraminal narrowing (mild/moderate right, moderate
left).

L5-S1: Disc bulge with endplate spurring. Mild facet arthrosis. No
significant spinal canal stenosis. Bilateral neural foraminal
narrowing (moderate to moderately severe right, mild left).
IMPRESSION: Lumbar spondylosis and epidural lipomatosis superimposed upon a
congenitally narrow lumbar spinal canal as outlined. Findings are
most notably as follows.

At L3-L4, there is mild disc degeneration. Disc bulge. Superimposed
central disc extrusion with mild cephalad migration. Mild facet
arthrosis/ligamentum flavum hypertrophy. Small bilateral facet joint
effusions. Epidural lipomatosis. Severe spinal canal stenosis with
likely impingement of the descending cauda equina nerve roots.

At L4-L5, there is moderate disc degeneration. Multifactorial
moderate/severe effacement of the thecal sac. However, epidural
lipomatosis contributes significantly to thecal sac effacement.
Bilateral neural foraminal narrowing (mild/moderate right, moderate
left).

At L5-S1, there is moderate/advanced disc degeneration.
Multifactorial bilateral neural foraminal narrowing (moderate to
moderately severe right, mild left).

## 2021-05-15 ENCOUNTER — Ambulatory Visit (INDEPENDENT_AMBULATORY_CARE_PROVIDER_SITE_OTHER): Payer: PPO

## 2021-05-15 DIAGNOSIS — E782 Mixed hyperlipidemia: Secondary | ICD-10-CM

## 2021-05-15 DIAGNOSIS — I10 Essential (primary) hypertension: Secondary | ICD-10-CM

## 2021-05-15 NOTE — Patient Instructions (Signed)
Visit Information  Following are the goals we discussed today:   Track and Manage My Blood Pressure   Timeframe:  Long-Range Goal Priority:  Medium Start Date:     05/15/2021                       Expected End Date:  05/15/2022                     Follow Up Date 05/15/2022   - check blood pressure 3 times per week - choose a place to take my blood pressure (home, clinic or office, retail store) - write blood pressure results in a log or diary    Why is this important?   You won't feel high blood pressure, but it can still hurt your blood vessels.  High blood pressure can cause heart or kidney problems. It can also cause a stroke.  Making lifestyle changes like losing a little weight or eating less salt will help.  Checking your blood pressure at home and at different times of the day can help to control blood pressure.  If the doctor prescribes medicine remember to take it the way the doctor ordered.  Call the office if you cannot afford the medicine or if there are questions about it.  Plan: Telephone follow up appointment with care management team member scheduled for:  12 months The patient has been provided with contact information for the care management team and has been advised to call with any health related questions or concerns.   Tomasa Blase, PharmD Clinical Pharmacist, [facility]   Please call the care guide team at [phone] if you need to cancel or reschedule your appointment.   Patient verbalizes understanding of instructions and care plan provided today and agrees to view in [facility]. Active MyChart status confirmed with patient.

## 2021-05-15 NOTE — Progress Notes (Signed)
Chronic Care Management Pharmacy Note  05/15/2021 Name:  Jasmine Bray MRN:  [id number] DOB:  11-23-1950  Summary: -Patient confirms adherence to current medications  -Reports since addition of amlodipine BP has been improved, averaging 130/70-80's at home, highest she could recall was 140/80 -Continues to follow with Dr. Toy Care regarding mental health medications - reports she is working with her to eventually wean off of alprazolam -Has been able to starting working out again - stopped due to [place] / back surgery  Recommendations/Changes made from today's visit: -Recommending no changes to medications, patient to continue monitoring blood pressure at least 2 times a week, reach out to office should BP control be lost prior to next appointment  -Patient to complete shingrix vaccination series with next visit to pharmacy   Plan: F/u in 12 months   Subjective: Jasmine Bray is an 71 y.o. year old female who is a primary patient of Burns, Claudina Lick, MD.  The CCM team was consulted for assistance with disease management and care coordination needs.    Engaged with patient by telephone for follow up visit in response to provider referral for pharmacy case management and/or care coordination services.   Consent to Services:  The patient was given the following information about Chronic Care Management services today, agreed to services, and gave verbal consent: 1. CCM service includes personalized support from designated clinical staff supervised by the primary care provider, including individualized plan of care and coordination with other care providers 2. 24/7 contact phone numbers for assistance for urgent and routine care needs. 3. Service will only be billed when office clinical staff spend 20 minutes or more in a month to coordinate care. 4. Only one practitioner may furnish and bill the service in a calendar month. 5.The patient may stop CCM services at any time (effective at the end of  the month) by phone call to the office staff. 6. The patient will be responsible for cost sharing (co-pay) of up to 20% of the service fee (after annual deductible is met). Patient agreed to services and consent obtained.  Patient Care Team: Binnie Rail, MD as PCP - General (Internal Medicine) Rosemarie Ax, MD as Consulting Physician (Family Medicine) Chucky May, MD as Consulting Physician (Psychiatry) Charlton Haws, [facility] as Pharmacist (Pharmacist) Opthamology, [facility] as Consulting Physician (Ophthalmology) [place], [facility] (Neurosurgery)  Recent office visits: 03/10/2021 - Dr. Quay Burow - start amlodipine 38m daily - f/u in 1 year   Recent consult visits: 03/03/2021 - Dr. SRaeford Razor- Sports Medicine - right shoulder pain - steroid injection - consider PT   Hospital visits: None in previous 6 months  Objective:  Lab Results  Component Value Date   CREATININE 0.62 03/10/2021   BUN 25 (H) 03/10/2021   GFR 89.82 03/10/2021   GFRNONAA 80.11 12/22/2009   NA 134 (L) 03/10/2021   K 4.0 03/10/2021   CALCIUM 9.4 03/10/2021   CO2 27 03/10/2021   GLUCOSE 82 03/10/2021    Lab Results  Component Value Date/Time   HGBA1C 5.9 03/10/2021 03:31 PM   HGBA1C 5.6 02/25/2020 02:24 PM   GFR 89.82 03/10/2021 03:31 PM   GFR 89.78 02/25/2020 02:24 PM    Last diabetic Eye exam:  No results found for: HMDIABEYEEXA  Last diabetic Foot exam:  No results found for: HMDIABFOOTEX   Lab Results  Component Value Date   CHOL 200 03/10/2021   HDL 69.10 03/10/2021   LDLCALC 107 (H) 03/10/2021  LDLDIRECT 113.0 01/20/2015   TRIG 118.0 03/10/2021   CHOLHDL 3 03/10/2021    Hepatic Function Latest Ref Rng & Units 03/10/2021 02/25/2020 02/20/2019  Total Protein 6.0 - 8.3 g/dL 7.5 7.1 7.1  Albumin 3.5 - 5.2 g/dL 4.3 4.2 4.0  AST 0 - 37 U/L _0 ALT 0 - 35 U/L _1 Alk Phosphatase 39 - 117 U/L 55 57 70  Total Bilirubin 0.2 - 1.2 mg/dL 0.5 0.4 0.4   Bilirubin, Direct 0.0 - 0.3 mg/dL - - -    Lab Results  Component Value Date/Time   TSH 2.01 03/10/2021 03:31 PM   TSH 1.59 02/25/2020 02:24 PM    CBC Latest Ref Rng & Units 03/10/2021 02/25/2020 02/20/2019  WBC 4.0 - 10.5 K/uL 7.5 9.7 8.2  Hemoglobin 12.0 - 15.0 g/dL 13.8 13.5 13.4  Hematocrit 36.0 - 46.0 % 41.4 38.9 39.3  Platelets 150.0 - 400.0 K/uL 198.0 222.0 214.0    No results found for: VD25OH  Clinical ASCVD: No  The 10-year ASCVD risk score (Arnett DK, et al., 2019) is: 10.2%   Values used to calculate the score:     Age: 50 years     Sex: Female     Is Non-Hispanic African American: No     Diabetic: No     Tobacco smoker: No     Systolic Blood Pressure: 814 mmHg     Is BP treated: Yes     HDL Cholesterol: 69.1 mg/dL     Total Cholesterol: 200 mg/dL    Depression screen [facility] 2/9 01/21/2021 01/21/2020 12/13/2018  Decreased Interest 0 0 0  Down, Depressed, Hopeless 0 0 0  PHQ - 2 Score 0 0 0  Altered sleeping - - 0  Tired, decreased energy - - 0  Change in appetite - - 0  Feeling bad or failure about yourself  - - 0  Trouble concentrating - - 0  Moving slowly or fidgety/restless - - 0  Suicidal thoughts - - 0  PHQ-9 Score - - 0  Difficult doing work/chores - - Not difficult at all     Social History   Tobacco Use  Smoking Status Never  Smokeless Tobacco Never   BP Readings from Last 3 Encounters:  03/10/21 (!) 150/80  03/03/21 (!) 160/108  02/25/20 140/84   Pulse Readings from Last 3 Encounters:  03/10/21 (!) 58  02/25/20 75  01/16/20 (!) 57   Wt Readings from Last 3 Encounters:  03/10/21 189 lb 3.2 oz (85.8 kg)  03/03/21 185 lb (83.9 kg)  02/25/20 184 lb 6.4 oz (83.6 kg)   BMI Readings from Last 3 Encounters:  03/10/21 29.63 kg/m  03/03/21 28.98 kg/m  02/25/20 28.88 kg/m    Assessment/Interventions: Review of patient past medical history, allergies, medications, health status, including review of consultants reports, laboratory and  other test data, was performed as part of comprehensive evaluation and provision of chronic care management services.   SDOH:  (Social Determinants of Health) assessments and interventions performed: Yes  SDOH Screenings   Alcohol Screen: Low Risk    Last Alcohol Screening Score (AUDIT): 3  Depression (PHQ2-9): Low Risk    PHQ-2 Score: 0  Financial Resource Strain: Low Risk    Difficulty of Paying Living Expenses: Not hard at all  Food Insecurity: No Food Insecurity   Worried About Charity fundraiser in the Last Year: Never true   Ran Out of Food in the Last Year: Never  true  Housing: Low Risk    Last Housing Risk Score: 0  Physical Activity: Sufficiently Active   Days of Exercise per Week: 5 days   Minutes of Exercise per Session: 30 min  Social Connections: Moderately Integrated   Frequency of Communication with Friends and Family: More than three times a week   Frequency of Social Gatherings with Friends and Family: More than three times a week   Attends Religious Services: More than 4 times per year   Active Member of [employer] or Organizations: Yes   Attends Music therapist: More than 4 times per year   Marital Status: Never married  Stress: No Stress Concern Present   Feeling of Stress : Not at all  Tobacco Use: Low Risk    Smoking Tobacco Use: Never   Smokeless Tobacco Use: Never   Passive Exposure: Not on file  Transportation Needs: No Transportation Needs   Lack of Transportation (Medical): No   Lack of Transportation (Non-Medical): No    CCM Care Plan  Allergies  Allergen Reactions   Latex     Medications Reviewed Today     Reviewed by Tomasa Blase, [facility] (Pharmacist) on 05/15/21 at [place] List Status: <None>   Medication Order Taking? Sig Documenting Provider Last Dose Status Informant  ALPRAZolam (XANAX) 1 MG tablet [id number] Yes Take 1 mg by mouth 3 (three) times daily as needed. [provider] Taking Active   amLODipine  (NORVASC) 5 MG tablet [id number] Yes Take 1 tablet (5 mg total) by mouth daily. Binnie Rail, MD Taking Active   aspirin EC 81 MG tablet [id number] Yes Take 81 mg by mouth daily. [provider] Taking Active   buPROPion (WELLBUTRIN XL) 150 MG 24 hr tablet [id number] Yes Take 150 mg by mouth daily. [provider] Taking Active            Med Note Linna Darner, Berna Bue Jan 10, 2013  9:57 AM) Dr Toy Care  Cholecalciferol (VITAMIN D3) 3000 UNITS TABS [id number] Yes Take 1 capsule by mouth daily. Taking 1000 units daily [provider] Taking Active   escitalopram (LEXAPRO) 20 MG tablet [id number] Yes Take 20 mg by mouth daily. [provider] Taking Active   estradiol (ESTRACE) 0.1 MG/GM vaginal cream [id number] Yes Place 2 g vaginally. 1 GRAM 3x A WEEK  [provider] Taking Active            Med Note (HOPPER, Berna Bue Jan 10, 2013  9:58 AM) Sherolyn Buba ,Gyn NP  estradiol (EVAMIST) 1.53 MG/SPRAY transdermal spray [id number] Yes Evamist 1.53 mg/spray (1.7 %) transdermal spray  APPLY 1 SPRAY DAILY AS DIRECTED [provider] Taking Active   gabapentin (NEURONTIN) 300 MG capsule [id number] Yes Take 1 capsule (300 mg total) by mouth 3 (three) times daily. Rosemarie Ax, MD Taking Active   hydrochlorothiazide (HYDRODIURIL) 25 MG tablet [id number] Yes Take 1 tablet by mouth every day Binnie Rail, MD Taking Active   LORazepam (ATIVAN) 1 MG tablet [id number] Yes Take 1 mg by mouth daily as needed. [provider] Taking Active   Multiple Vitamin (MULTIVITAMIN) tablet [id number] Yes Take 1 tablet by mouth daily. [provider] Taking Active   Multiple Vitamins-Minerals (HAIR SKIN AND NAILS FORMULA) TABS [id number] Yes Take by mouth. [provider] Taking Active   Omega-3 Fatty Acids (FISH OIL) 1000 MG CAPS [id number] Yes Take by mouth daily. [provider] Taking Active  Probiotic Product (PROBIOTIC PEARLS PO)  [id number] Yes Take 1 tablet by mouth daily. [provider] Taking Active   simvastatin (ZOCOR) 40 MG tablet [id number] Yes TAKE 1 TABLET BY MOUTH EVERYDAY AT BEDTIME Binnie Rail, MD Taking Active   Turmeric 500 MG CAPS [id number] Yes Take 1,000 mg by mouth daily. [provider] Taking Active   valACYclovir (VALTREX) 1000 MG tablet [id number] Yes Take 1,000 mg by mouth as needed. 1/2 by mouth once daily [provider] Taking Active            Med Note (HOPPER, Berna Bue Jan 10, 2013 10:00 AM) Sherolyn Buba , Gyn NP 1/2 pill /day            Patient Active Problem List   Diagnosis Date Noted   Effusion of joint of right shoulder 03/03/2021   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   Depression 09/06/2019   History of melanoma 02/20/2019   Osteoarthritis 02/20/2019   Primary osteoarthritis of right knee 07/18/2018   Sciatica of left side 10/10/2017   Chronic knee pain 07/25/2017   Hepatitis C antibody test positive - cleared 01/29/2016   Acute pain of right shoulder 04/24/2013   Prediabetes 01/10/2012   Hyperlipidemia 11/11/2009   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   Essential hypertension 11/11/2009    Immunization History  Administered Date(s) Administered   Influenza Split 02/02/2011, 01/10/2012, 01/11/2020   Influenza Whole 12/22/2009   Influenza, High Dose Seasonal PF 01/22/2016, 01/24/2017, 01/25/2018, 01/25/2019, 01/08/2021   Influenza,inj,Quad PF,6+ Mos 01/17/2014   PFIZER(Purple Top)SARS-COV-2 Vaccination 05/13/2019, 06/05/2019, 01/11/2020   Pneumococcal Conjugate-13 03/08/2016   Pneumococcal Polysaccharide-23 02/20/2019   Td 11/11/2009   Tdap 07/02/2020   Zoster, Live 01/20/2015    Conditions to be addressed/monitored:  Hypertension and Hyperlipidemia  Care Plan : [place]  Updates made by Tomasa Blase, RPH since 05/15/2021 12:00 AM      Problem: HTN, HLD, Chronic Pain   Priority: High  Onset Date: 05/15/2021     Long-Range Goal: Disease Management   Start Date: 05/15/2021  Expected End Date: 05/15/2022  This Visit's Progress: On track  Priority: Medium  Note:   Hypertension Improved - reports BP has been averaging 130/70-80's since addition of amlodipine  BP Readings from Last 3 Encounters:  03/10/21 (!) 150/80  03/03/21 (!) 160/108  02/25/20 140/84  Pharmacist Clinical Goal(s): Patient will work with PharmD and providers to achieve BP goal <130/80 Current regimen:  HCTZ 25 mg daily Amlodipine 25m daily  Interventions: Discussed BP goals and benefits of medications for prevention of heart attack / stroke Patient self care activities -Patient will: Check BP weekly, document, and provide at future appointments Ensure daily salt intake < 2300 mg/day  Hyperlipidemia Controlled - near goal  Lab Results  Component Value Date   LDLCALC 107 (H) 03/10/2021  Pharmacist Clinical Goal(s): Patient will work with PharmD and providers to maintain LDL goal < 100 Current regimen:  Simvastatin 40 mg at bedtime Aspirin 81 mg daily Fish oils 10046mdaily  Interventions: Discussed cholesterol goals and benefits of medications for prevention of heart attack / stroke Patient self care activities - Patient will: Continue simvastatin as prescribed Consider stopping aspirin due to lack of benefit  Chronic pain Controlled - has been able to start working out again  Pharmacist Clinical Goal(s) Patient will work with PharmD and providers to optimize therapy Current  regimen:  Gabapentin 300 mg 3 times daily prn  Turmeric 1000 mg daily Interventions: Discussed benefits and risks of medications Patient self care activities - Patient will: Continue current medications as prescribed  Depression/Anxiety (Goal: Prevention of anxiety attacks / promotion of positive mood) - Following with Dr. Toy Care  -Controlled -Current  treatment: Bupropion XL 153m daily  Lorazepam 160mdaily as needed  Escitalopram 2048maily  Alprazolam 1mg70m1 tablet 3 times daily as needed  -Medications previously tried/failed: n/a -Educated on Benefits of medication for symptom control Benefits of cognitive-behavioral therapy with or without medication -Patient following with Dr. KaurToy Careeports that plan is to be able to wean off of alprazolam in the future - stable at this time  -Recommending to continue current medications   Medication management Pharmacist Clinical Goal(s): Patient will work with PharmD and providers to maintain optimal medication adherence Current pharmacy: [facility] mail order Interventions Comprehensive medication review performed. Continue current medication management strategy Patient self care activities -Patient will: Focus on medication adherence by fill date Take medications as prescribed Report any questions or concerns to PharmD and/or provider(s)       Medication Assistance: None required.  Patient affirms current coverage meets needs.  Compliance/Adherence/Medication fill history: Care Gaps: Shingles Vaccine Tdap Mammogram COVID booster   Patient's [facility] pharmacy [facility]  [id number] [address]: [phone] [place]: [phone] HookoSurgical Center At C[facility] Knoll[facility] LLCorth[place]- [place] [address]: [phone] DELIVWad[place]- [place] [address]e: [phone] pill box? No - able to manage without  Pt endorses 100% compliance  Care Plan and Follow Up Patient Decision:  Patient agrees to Care  Plan and Follow-up.  Plan: Telephone follow up appointment with care management team member scheduled for:  12 months  The patient has been provided with contact information for the care management team and has been advised to call with any health related questions or concerns.   DanieTomasa BlasermD Clinical Pharmacist, [place]

## 2021-05-25 ENCOUNTER — Other Ambulatory Visit: Payer: Self-pay

## 2021-05-25 ENCOUNTER — Telehealth: Payer: Self-pay

## 2021-05-25 MED ORDER — SIMVASTATIN 40 MG PO TABS: ORAL_TABLET | ORAL | 2 refills | Status: DC

## 2021-05-25 NOTE — Telephone Encounter (Signed)
Pt is requesting a refill on: simvastatin (ZOCOR) 40 MG tablet  Pharmacy: [id number] [place], [address] 30 day supply  Remaining refills go to: [facility] ([place], [place] requesting one year supply  Pt CB [phone]

## 2021-05-25 NOTE — Telephone Encounter (Signed)
Sent in today 

## 2021-05-26 ENCOUNTER — Other Ambulatory Visit: Payer: Self-pay | Admitting: Internal Medicine

## 2021-05-26 DIAGNOSIS — E782 Mixed hyperlipidemia: Secondary | ICD-10-CM | POA: Diagnosis not present

## 2021-05-26 DIAGNOSIS — I10 Essential (primary) hypertension: Secondary | ICD-10-CM

## 2021-06-03 ENCOUNTER — Encounter: Payer: Self-pay | Admitting: Internal Medicine

## 2021-06-04 ENCOUNTER — Other Ambulatory Visit: Payer: Self-pay

## 2021-06-04 ENCOUNTER — Telehealth: Payer: Self-pay

## 2021-06-04 MED ORDER — SIMVASTATIN 40 MG PO TABS: ORAL_TABLET | ORAL | 0 refills | Status: DC

## 2021-06-11 ENCOUNTER — Other Ambulatory Visit: Payer: Self-pay

## 2021-06-11 MED ORDER — SIMVASTATIN 40 MG PO TABS: ORAL_TABLET | ORAL | 1 refills | Status: DC

## 2021-06-11 NOTE — Telephone Encounter (Signed)
Called [facility] today and we are set. ? ?Patient notified via my-chart ?

## 2021-07-14 DIAGNOSIS — D2221 Melanocytic nevi of right ear and external auricular canal: Secondary | ICD-10-CM | POA: Diagnosis not present

## 2021-07-14 DIAGNOSIS — L570 Actinic keratosis: Secondary | ICD-10-CM | POA: Diagnosis not present

## 2021-07-14 DIAGNOSIS — L821 Other seborrheic keratosis: Secondary | ICD-10-CM | POA: Diagnosis not present

## 2021-07-14 DIAGNOSIS — Z86006 Personal history of melanoma in-situ: Secondary | ICD-10-CM | POA: Diagnosis not present

## 2021-07-14 DIAGNOSIS — L814 Other melanin hyperpigmentation: Secondary | ICD-10-CM | POA: Diagnosis not present

## 2021-07-14 DIAGNOSIS — L578 Other skin changes due to chronic exposure to nonionizing radiation: Secondary | ICD-10-CM | POA: Diagnosis not present

## 2021-07-23 ENCOUNTER — Encounter: Payer: Self-pay | Admitting: Internal Medicine

## 2021-07-23 NOTE — Progress Notes (Signed)
Outside notes received. Information abstracted. Notes sent to scan.  

## 2021-08-20 ENCOUNTER — Telehealth: Payer: Self-pay

## 2021-08-20 NOTE — Chronic Care Management (AMB) (Unsigned)
Chronic Care Management Pharmacy Assistant   Name: Jasmine Bray  MRN: [id number] DOB: 02-25-1951    Reason for Encounter: Disease State-General    Recent office visits:  None since the last coordination call 05/15/21  Recent consult visits:  None since the last coordination call 05/15/21  Hospital visits:  None since the last coordination call 05/15/21   Medications: Outpatient Encounter Medications as of 08/20/2021  Medication Sig Note   ALPRAZolam (XANAX) 1 MG tablet Take 1 mg by mouth 3 (three) times daily as needed.    amLODipine (NORVASC) 5 MG tablet Take 1 tablet (5 mg total) by mouth daily.    aspirin EC 81 MG tablet Take 81 mg by mouth daily.    buPROPion (WELLBUTRIN XL) 150 MG 24 hr tablet Take 150 mg by mouth daily. 01/10/2013: Dr Toy Care   Cholecalciferol (VITAMIN D3) 3000 UNITS TABS Take 1 capsule by mouth daily. Taking 1000 units daily    escitalopram (LEXAPRO) 20 MG tablet Take 20 mg by mouth daily.    estradiol (ESTRACE) 0.1 MG/GM vaginal cream Place 2 g vaginally. 1 GRAM 3x A WEEK  01/10/2013: Sherolyn Buba ,Gyn NP   estradiol (EVAMIST) 1.53 MG/SPRAY transdermal spray Evamist 1.53 mg/spray (1.7 %) transdermal spray  APPLY 1 SPRAY DAILY AS DIRECTED    gabapentin (NEURONTIN) 300 MG capsule Take 1 capsule (300 mg total) by mouth 3 (three) times daily.    Garlic 865 MG TABS Take by mouth.    hydrochlorothiazide (HYDRODIURIL) 25 MG tablet Take 1 tablet by mouth every day    ibuprofen (ADVIL) 200 MG tablet Take 400 mg by mouth 2 (two) times daily as needed.    Ibuprofen-diphenhydrAMINE HCl (ADVIL PM) 200-25 MG CAPS Take 1 tablet by mouth at bedtime as needed.    LORazepam (ATIVAN) 1 MG tablet Take 1 mg by mouth daily as needed.    Multiple Vitamin (MULTIVITAMIN) tablet Take 1 tablet by mouth daily.    Multiple Vitamins-Minerals (HAIR SKIN AND NAILS FORMULA) TABS Take by mouth.    Omega-3 Fatty Acids (FISH OIL) 1000 MG CAPS Take by mouth daily.    Probiotic  Product (PROBIOTIC PEARLS PO) Take 1 tablet by mouth daily.    simvastatin (ZOCOR) 40 MG tablet TAKE 1 TABLET BY MOUTH EVERYDAY AT BEDTIME    Turmeric 500 MG CAPS Take 1,000 mg by mouth daily.    valACYclovir (VALTREX) 1000 MG tablet Take 1,000 mg by mouth as needed. 1/2 by mouth once daily 01/10/2013: Sherolyn Buba , Concha Norway NP 1/2 pill /day   No facility-administered encounter medications on file as of 08/20/2021.   [place] for General Review Call   Chart Review:  Have there been any documented new, changed, or discontinued medications since last visit? No (If yes, include name, dose, frequency, date) Has there been any documented recent hospitalizations or ED visits since last visit with Clinical Pharmacist? No Brief Summary (including medication and/or Diagnosis changes):   Adherence Review:  Does the Clinical Pharmacist Assistant have access to adherence rates? Yes Adherence rates for STAR metric medications (List medication(s)/day supply/ last 2 fill dates). Adherence rates for medications indicated for disease state being reviewed (List medication(s)/day supply/ last 2 fill dates). Does the patient have >5 day gap between last estimated fill dates for any of the above medications or other medication gaps? No Reason for medication gaps.   Disease State Questions:  Able to connect with Patient? {yes/no:20286}  Did patient have any problems with  their health recently? {yes/no:20286} Note problems and Concerns:  Have you had any admissions or emergency room visits or worsening of your condition(s) since last visit? {yes/no:20286} Details of ED visit, hospital visit and/or worsening condition(s):  Have you had any visits with new specialists or providers since your last visit? {yes/no:20286} Explain:  Have you had any new health care problem(s) since your last visit? {yes/no:20286} New problem(s) reported:  Have you run out of any of your medications since  you last spoke with clinical pharmacist? {yes/no:20286} What caused you to run out of your medications?  Are there any medications you are not taking as prescribed? {yes/no:20286} What kept you from taking your medications as prescribed?  Are you having any issues or side effects with your medications? {yes/no:20286} Note of issues or side effects:  Do you have any other health concerns or questions you want to discuss with your Clinical Pharmacist before your next visit? {yes/no:20286} Note additional concerns and questions from Patient.  Are there any health concerns that you feel we can do a better job addressing? {yes/no:20286} Note Patient's response.  Are you having any problems with any of the following since the last visit: (select all that apply)  {General Call:27390}  Details:  12. Any falls since last visit? {yes/no:20286}  Details:  13. Any increased or uncontrolled pain since last visit? {yes/no:20286}  Details:  14. Next visit Type: telephone       Visit with:clinical pharmacist        Date:05/14/22        Time:11 am  15. Additional Details? {yes/no:20286}    Care Gaps: Colonoscopy-09/25/13 Diabetic Foot Exam-NA Mammogram-03/31/21 Ophthalmology-NA Dexa Scan - 12/01/17 Annual Well Visit - 02/25/20 Micro albumin-NA Hemoglobin A1c- 03/10/21  Star Rating Drugs: Simvastatin 40 mg-last fill 07/03/21 90 ds  Ethelene Hal Clinical Pharmacist Assistant [phone]

## 2021-08-29 ENCOUNTER — Other Ambulatory Visit: Payer: Self-pay | Admitting: Internal Medicine

## 2021-11-17 ENCOUNTER — Ambulatory Visit: Payer: Self-pay

## 2021-11-17 ENCOUNTER — Ambulatory Visit: Payer: PPO | Admitting: Family Medicine

## 2021-11-17 ENCOUNTER — Encounter: Payer: Self-pay | Admitting: Family Medicine

## 2021-11-17 VITALS — BP 130/80 | Ht 67.0 in | Wt 189.0 lb

## 2021-11-17 DIAGNOSIS — M12811 Other specific arthropathies, not elsewhere classified, right shoulder: Secondary | ICD-10-CM

## 2021-11-17 DIAGNOSIS — M25411 Effusion, right shoulder: Secondary | ICD-10-CM

## 2021-11-17 NOTE — Assessment & Plan Note (Signed)
Acute on chronic in nature.  We have tried injection last December.  She has been under the care of greater than 6 weeks of physician directed home exercise therapy.  She reports multiple falls in the past.  Pain is getting worse acutely. -Counseled on home exercise therapy and supportive care. -X-ray. -MRI of the right shoulder to evaluate for rotator cuff tear, labral tear and for presurgical planning.

## 2021-11-17 NOTE — Progress Notes (Signed)
  Jasmine Bray - 71 y.o. female MRN [id number]  Date of birth: September 29, 1950  SUBJECTIVE:  Including CC & ROS.  No chief complaint on file.   Jasmine Bray is a 71 y.o. female that is presenting with acute on chronic right shoulder pain.    Review of Systems See HPI   HISTORY: Past Medical, Surgical, Social, and Family History Reviewed & Updated per EMR.   Pertinent Historical Findings include:  Past Medical History:  Diagnosis Date   Allergy    RHINITIS   Anxiety 1989   Dr Jake Michaelis, FP   Back injury    Depression    Dr Toy Care   Fibroids    GERD 11/11/2009   Qualifier: Diagnosis of  By: Linna Darner MD, Gwyndolyn Saxon   Dysphagia once monthly on average 4-10/14 ; resolved with Prilosec OTC prn No PMH of endoscopy     GERD (gastroesophageal reflux disease)    Hyperlipidemia    Hypertension     Past Surgical History:  Procedure Laterality Date   ABDOMINAL HYSTERECTOMY     & USO   ANAL FISTULA ECTOMY      X 2, Dr Rosana Hoes   COLONOSCOPY  2004, 2015   [facility]   INJECTION KNEE Bilateral    [place], Dr Ubaldo Glassing   steroid injection shoulder Right 05/11/2013   contusion/strain; Dr Onnie Graham     PHYSICAL EXAM:  VS: BP 130/80 (BP Location: Left Arm, Patient Position: Sitting)   Ht '5\' 7"'$  (1.702 m)   Wt 189 lb (85.7 kg)   BMI 29.60 kg/m  Physical Exam Gen: NAD, alert, cooperative with exam, well-appearing MSK:  Right shoulder: Limited flexion and abduction. Limited external rotation. Positive empty can test. Positive O'Brien's test. Neurovascularly intact    Limited ultrasound: Right shoulder:  Degenerative changes of the biceps tendon as well as an effusion within the biceps tendon sheath. Chronic changes of the subscapularis with overlying effusion. Partial full-thickness tear of the supraspinatus.  Summary: Degenerative joint changes as well as partial full-thickness tear of the supraspinatus.  Ultrasound and interpretation by Clearance Coots,  MD    ASSESSMENT & PLAN:   Rotator cuff arthropathy of right shoulder Acute on chronic in nature.  We have tried injection last December.  She has been under the care of greater than 6 weeks of physician directed home exercise therapy.  She reports multiple falls in the past.  Pain is getting worse acutely. -Counseled on home exercise therapy and supportive care. -X-ray. -MRI of the right shoulder to evaluate for rotator cuff tear, labral tear and for presurgical planning.

## 2021-11-17 NOTE — Patient Instructions (Signed)
Good to see you I will call you with the x-ray results. We will obtain an MRI of the right shoulder at [facility] imaging. Please send me a message in MyChart with any questions or updates.  We will call you when the Zilretta sample is in for the injection.   --Dr. Raeford Razor

## 2021-11-23 ENCOUNTER — Ambulatory Visit: Payer: PPO | Admitting: Family Medicine

## 2021-11-24 ENCOUNTER — Telehealth (INDEPENDENT_AMBULATORY_CARE_PROVIDER_SITE_OTHER): Payer: PPO | Admitting: Family Medicine

## 2021-11-24 VITALS — Ht 67.0 in | Wt 180.0 lb

## 2021-11-24 DIAGNOSIS — R0981 Nasal congestion: Secondary | ICD-10-CM | POA: Diagnosis not present

## 2021-11-24 DIAGNOSIS — R059 Cough, unspecified: Secondary | ICD-10-CM

## 2021-11-24 DIAGNOSIS — R062 Wheezing: Secondary | ICD-10-CM | POA: Diagnosis not present

## 2021-11-24 MED ORDER — BENZONATATE 100 MG PO CAPS: ORAL_CAPSULE | ORAL | 0 refills | Status: DC

## 2021-11-24 MED ORDER — ALBUTEROL SULFATE HFA 108 (90 BASE) MCG/ACT IN AERS: 2.0000 | INHALATION_SPRAY | Freq: Four times a day (QID) | RESPIRATORY_TRACT | 0 refills | Status: DC | PRN

## 2021-11-24 MED ORDER — DOXYCYCLINE HYCLATE 100 MG PO TABS: 100.0000 mg | ORAL_TABLET | Freq: Two times a day (BID) | ORAL | 0 refills | Status: DC

## 2021-11-24 NOTE — Progress Notes (Signed)
Virtual Visit via Video Note  I connected with Jasmine Bray  on 11/24/21 at 11:40 AM EDT by a video enabled telemedicine application and verified that I am speaking with the correct person using two identifiers.  Location patient: [place] Location provider:work or home office Persons participating in the virtual visit: patient, provider  I discussed the limitations and requested verbal permission for telemedicine visit. The patient expressed understanding and agreed to proceed.   HPI:  Acute telemedicine visit for sinus congestion/feels like a "sinus infection": -Onset: 2 days ago -had on negative covid test Sunday -Symptoms include: sore throat, nasal sinus congestion - thick mucus, PND, cough, wheezing, some SOB last night, fatigue, some headache -reports feeling better some today and does not wish to go to inperson visit -Denies: CP, fever, NVD -able to tol oral intake -was around a lot of kids at a play 4 days ago, but no known sick contacts -Pertinent past medical history: see below -Pertinent medication allergies:No Active Allergies -COVID-19 vaccine status:  Immunization History  Administered Date(s) Administered   Influenza Split 02/02/2011, 01/10/2012, 01/11/2020   Influenza Whole 12/22/2009   Influenza, High Dose Seasonal PF 01/22/2016, 01/24/2017, 01/25/2018, 01/25/2019, 01/08/2021   Influenza,inj,Quad PF,6+ Mos 01/17/2014   PFIZER(Purple Top)SARS-COV-2 Vaccination 05/13/2019, 06/05/2019, 01/11/2020   Pneumococcal Conjugate-13 03/08/2016   Pneumococcal Polysaccharide-23 02/20/2019   Td 11/11/2009   Tdap 07/02/2020   Zoster, Live 01/20/2015     ROS: See pertinent positives and negatives per HPI.  Past Medical History:  Diagnosis Date   Allergy    RHINITIS   Anxiety 1989   Dr Jake Michaelis, FP   Back injury    Depression    Dr Toy Care   Fibroids    GERD 11/11/2009   Qualifier: Diagnosis of  By: Linna Darner MD, Gwyndolyn Saxon   Dysphagia once monthly on average 4-10/14 ; resolved with  Prilosec OTC prn No PMH of endoscopy     GERD (gastroesophageal reflux disease)    Hyperlipidemia    Hypertension     Past Surgical History:  Procedure Laterality Date   ABDOMINAL HYSTERECTOMY     & USO   ANAL FISTULA ECTOMY      X 2, Dr Rosana Hoes   COLONOSCOPY  2004, 2015   Carlean Purl   INJECTION KNEE Bilateral    [facility], Dr Ubaldo Glassing   steroid injection shoulder Right 05/11/2013   contusion/strain; Dr Onnie Graham     Current Outpatient Medications:    albuterol (PROAIR HFA) 108 (90 Base) MCG/ACT inhaler, Inhale 2 puffs into the lungs every 6 (six) hours as needed for wheezing or shortness of breath., Disp: 1 each, Rfl: 0   ALPRAZolam (XANAX) 1 MG tablet, Take 1 mg by mouth 3 (three) times daily as needed., Disp: , Rfl:    amLODipine (NORVASC) 5 MG tablet, TAKE 1 TABLET (5 MG TOTAL) BY MOUTH DAILY., Disp: 90 tablet, Rfl: 1   aspirin EC 81 MG tablet, Take 81 mg by mouth daily., Disp: , Rfl:    benzonatate (TESSALON PERLES) 100 MG capsule, 1-2 capsules up twice daily as needed for cough, Disp: 30 capsule, Rfl: 0   buPROPion (WELLBUTRIN XL) 150 MG 24 hr tablet, Take 150 mg by mouth daily., Disp: , Rfl:    Cholecalciferol (VITAMIN D3) 3000 UNITS TABS, Take 1 capsule by mouth daily. Taking 1000 units daily, Disp: , Rfl:    doxycycline (VIBRA-TABS) 100 MG tablet, Take 1 tablet (100 mg total) by mouth 2 (two) times daily.,  Disp: 14 tablet, Rfl: 0   escitalopram (LEXAPRO) 20 MG tablet, Take 20 mg by mouth daily., Disp: , Rfl:    estradiol (ESTRACE) 0.1 MG/GM vaginal cream, Place 2 g vaginally. 1 GRAM 3x A WEEK , Disp: , Rfl:    estradiol (EVAMIST) 1.53 MG/SPRAY transdermal spray, Evamist 1.53 mg/spray (1.7 %) transdermal spray  APPLY 1 SPRAY DAILY AS DIRECTED, Disp: , Rfl:    gabapentin (NEURONTIN) 300 MG capsule, Take 1 capsule (300 mg total) by mouth 3 (three) times daily., Disp: 180 capsule, Rfl: 3   Garlic 947 MG TABS, Take by mouth., Disp: , Rfl:     hydrochlorothiazide (HYDRODIURIL) 25 MG tablet, Take 1 tablet by mouth every day, Disp: 90 tablet, Rfl: 2   ibuprofen (ADVIL) 200 MG tablet, Take 400 mg by mouth 2 (two) times daily as needed., Disp: , Rfl:    Ibuprofen-diphenhydrAMINE HCl (ADVIL PM) 200-25 MG CAPS, Take 1 tablet by mouth at bedtime as needed., Disp: , Rfl:    LORazepam (ATIVAN) 1 MG tablet, Take 1 mg by mouth daily as needed., Disp: , Rfl:    Multiple Vitamin (MULTIVITAMIN) tablet, Take 1 tablet by mouth daily., Disp: , Rfl:    Multiple Vitamins-Minerals (HAIR SKIN AND NAILS FORMULA) TABS, Take by mouth., Disp: , Rfl:    Omega-3 Fatty Acids (FISH OIL) 1000 MG CAPS, Take by mouth daily., Disp: , Rfl:    Probiotic Product (PROBIOTIC PEARLS PO), Take 1 tablet by mouth daily., Disp: , Rfl:    simvastatin (ZOCOR) 40 MG tablet, TAKE 1 TABLET BY MOUTH EVERYDAY AT BEDTIME, Disp: 90 tablet, Rfl: 1   Turmeric 500 MG CAPS, Take 1,000 mg by mouth daily., Disp: , Rfl:    valACYclovir (VALTREX) 1000 MG tablet, Take 1,000 mg by mouth as needed. 1/2 by mouth once daily, Disp: , Rfl:    bimatoprost (LATISSE) 0.03 % ophthalmic solution, 1 drop to affected area Externally Once a day for 30 days, Disp: , Rfl:   EXAM:  VITALS per patient if applicable:denies fever  GENERAL: alert, oriented, appears well and in no acute distress  HEENT: atraumatic, conjunttiva clear, no obvious abnormalities on inspection of external nose and ears  NECK: normal movements of the head and neck  LUNGS: on inspection no signs of respiratory distress, breathing rate appears normal, no obvious gross SOB, gasping or wheezing  CV: no obvious cyanosis  MS: moves all visible extremities without noticeable abnormality  PSYCH/NEURO: pleasant and cooperative, no obvious depression or anxiety, speech and thought processing grossly intact  ASSESSMENT AND PLAN:  Discussed the following assessment and plan:  Nasal congestion  Cough, unspecified  type  Wheezing  -we discussed possible serious and likely etiologies, options for evaluation and workup, limitations of telemedicine visit vs in person visit, treatment, treatment risks and precautions. Pt is agreeable to treatment via telemedicine at this moment. Advise inperson visit would be best and advised of options [facility], vs PCP office vs ER if any severe symptoms. She prefers to try repeat covid testing at home and medications at home first. She plans to repeat covid test and call [facility] for antiviral if positive. If neg she wants to try Tessalon rx for cough, alb for possible bronchitis, doxy in case of sinusitis or CAP. Discussed risk of empiric tx with abx and she prefers that and declines multiple times to go for inperson care today. She does agree to seek prompt virtual visit or in person care if worsening, new symptoms arise, or  if is not improving with treatment as expected per our conversation of expected course. Discussed proper use of inhaler and demonstrated. Discussed options for follow up care. Did let this patient know that I do telemedicine on Tuesdays and Thursdays for [facility] and those are the days I am logged into the system. Advised to schedule follow up visit with PCP, [facility] virtual visits or [facility] if any further questions or concerns to avoid delays in care.   I discussed the assessment and treatment plan with the patient. The patient was provided an opportunity to ask questions and all were answered. The patient agreed with the plan and demonstrated an understanding of the instructions.     Lucretia Kern, DO

## 2021-11-24 NOTE — Patient Instructions (Addendum)
If any shortness of breath, difficulty breathing, chest pain, worsening or severe symptoms seek inperson care right away! It would be best to be seen in person as we discussed for evaluation and treatment of your symptoms.  Do covid testing, 2-3 tests 1-2 days apart are often needed to pick up a positive case. Negative test does not rule out covid as a possibility, but if negative you will need to be seen inperson to look for other causes of your symptoms. If positive and in the first 5 days of symptoms you can contact the [facility] pharmacy or do a follow up virtural visit for treatment with an antiviral medication.   -I sent the medication(s) we discussed to your pharmacy: Meds ordered this encounter  Medications   albuterol (PROAIR HFA) 108 (90 Base) MCG/ACT inhaler    Sig: Inhale 2 puffs into the lungs every 6 (six) hours as needed for wheezing or shortness of breath.    Dispense:  1 each    Refill:  0   benzonatate (TESSALON PERLES) 100 MG capsule    Sig: 1-2 capsules up twice daily as needed for cough    Dispense:  30 capsule    Refill:  0   doxycycline (VIBRA-TABS) 100 MG tablet    Sig: Take 1 tablet (100 mg total) by mouth 2 (two) times daily.    Dispense:  14 tablet    Refill:  0     I hope you are feeling better soon!  Seek in person medical care if your symptoms worsen, new concerns arise or you are not improving promptly with treatment.  It was nice to meet you today. I help [facility] out with telemedicine visits on Tuesdays and Thursdays and am happy to help if you need a virtual follow up visit on those days. Otherwise, if you have any concerns or questions following this visit please schedule a follow up visit with your Primary Care office or seek care at a local urgent care clinic to avoid delays in care. If you are having severe or life threatening symptoms please call 911 and/or go to the nearest emergency room.

## 2022-01-11 ENCOUNTER — Other Ambulatory Visit: Payer: Self-pay

## 2022-01-11 ENCOUNTER — Telehealth: Payer: Self-pay

## 2022-01-11 ENCOUNTER — Other Ambulatory Visit: Payer: Self-pay | Admitting: Internal Medicine

## 2022-01-11 MED ORDER — HYDROCHLOROTHIAZIDE 25 MG PO TABS: 25.0000 mg | ORAL_TABLET | Freq: Every day | ORAL | 0 refills | Status: DC

## 2022-01-11 MED ORDER — SIMVASTATIN 40 MG PO TABS: ORAL_TABLET | ORAL | 0 refills | Status: DC

## 2022-01-11 NOTE — Telephone Encounter (Signed)
MEDICATION: hydrochlorothiazide (HYDRODIURIL) 25 MG tablet // simvastatin (ZOCOR) 40 MG tablet  PHARMACY: [facility] ([place], [place]  Comments: Patient requested a 15 or 30 day supply of Hydrochlorothiazide sent to [facility] on [address] as she is completely out and [facility] has attempted to contact us several times with no response.   **Let patient know to contact pharmacy at the end of the day to make sure medication is ready. **  ** Please notify patient to allow 48-72 hours to process**  **Encourage patient to contact the pharmacy for refills or they can request refills through [facility]**

## 2022-01-11 NOTE — Telephone Encounter (Signed)
Spoke with patient today and refills sent in

## 2022-01-12 ENCOUNTER — Other Ambulatory Visit: Payer: Self-pay

## 2022-01-15 ENCOUNTER — Telehealth: Payer: Self-pay | Admitting: Internal Medicine

## 2022-01-15 NOTE — Telephone Encounter (Signed)
Called pt to schedule AWV-she declined scheduling it now. She stated she wasn't feeling good and requested a call back at a later date.

## 2022-01-20 ENCOUNTER — Ambulatory Visit (HOSPITAL_BASED_OUTPATIENT_CLINIC_OR_DEPARTMENT_OTHER)
Admission: RE | Admit: 2022-01-20 | Discharge: 2022-01-20 | Disposition: A | Discharge status: Home / Self Care | Payer: PPO | Source: Ambulatory Visit | Attending: Family Medicine | Admitting: Family Medicine

## 2022-01-20 DIAGNOSIS — M25511 Pain in right shoulder: Secondary | ICD-10-CM | POA: Diagnosis not present

## 2022-01-20 DIAGNOSIS — M12811 Other specific arthropathies, not elsewhere classified, right shoulder: Secondary | ICD-10-CM | POA: Diagnosis not present

## 2022-01-25 ENCOUNTER — Ambulatory Visit (INDEPENDENT_AMBULATORY_CARE_PROVIDER_SITE_OTHER): Payer: PPO

## 2022-01-25 VITALS — Ht 67.0 in | Wt 152.0 lb

## 2022-01-25 DIAGNOSIS — Z0000 Encounter for general adult medical examination without abnormal findings: Secondary | ICD-10-CM

## 2022-01-25 NOTE — Patient Instructions (Signed)
Jasmine Bray , Thank you for taking time to come for your Medicare Wellness Visit. I appreciate your ongoing commitment to your health goals. Please review the following plan we discussed and let me know if I can assist you in the future.   These are the goals we discussed:  Goals      Track and Manage My Blood Pressure-Hypertension     Timeframe:  Long-Range Goal Priority:  Medium Start Date:     05/15/2021                       Expected End Date:  05/15/2022                     Follow Up Date 05/15/2022   - check blood pressure 3 times per week - choose a place to take my blood pressure (home, clinic or office, retail store) - write blood pressure results in a log or diary    Why is this important?   You won't feel high blood pressure, but it can still hurt your blood vessels.  High blood pressure can cause heart or kidney problems. It can also cause a stroke.  Making lifestyle changes like losing a little weight or eating less salt will help.  Checking your blood pressure at home and at different times of the day can help to control blood pressure.  If the doctor prescribes medicine remember to take it the way the doctor ordered.  Call the office if you cannot afford the medicine or if there are questions about it.     Notes:         This is a list of the screening recommended for you and due dates:  Health Maintenance  Topic Date Due   Zoster (Shingles) Vaccine (1 of 2) Never done   COVID-19 Vaccine (4 - Pfizer risk series) 03/07/2020   Flu Shot  10/27/2021   DEXA scan (bone density measurement)  12/02/2022   Medicare Annual Wellness Visit  01/26/2023   Mammogram  04/01/2023   Colon Cancer Screening  09/26/2023   Tetanus Vaccine  07/03/2030   Pneumonia Vaccine  Completed   Hepatitis C Screening: USPSTF Recommendation to screen - Ages 18-79 yo.  Completed   HPV Vaccine  Aged Out    Advanced directives: No  Conditions/risks identified: Yes  Next appointment: Follow up  in one year for your annual wellness visit.   Preventive Care 29 Years and Older, Female Preventive care refers to lifestyle choices and visits with your health care provider that can promote health and wellness. What does preventive care include? A yearly physical exam. This is also called an annual well check. Dental exams once or twice a year. Routine eye exams. Ask your health care provider how often you should have your eyes checked. Personal lifestyle choices, including: Daily care of your teeth and gums. Regular physical activity. Eating a healthy diet. Avoiding tobacco and drug use. Limiting alcohol use. Practicing safe sex. Taking low-dose aspirin every day. Taking vitamin and mineral supplements as recommended by your health care provider. What happens during an annual well check? The services and screenings done by your health care provider during your annual well check will depend on your age, overall health, lifestyle risk factors, and family history of disease. Counseling  Your health care provider may ask you questions about your: Alcohol use. Tobacco use. Drug use. Emotional well-being. Home and relationship well-being. Sexual activity. Eating habits. History  of falls. Memory and ability to understand (cognition). Work and work Statistician. Reproductive health. Screening  You may have the following tests or measurements: Height, weight, and BMI. Blood pressure. Lipid and cholesterol levels. These may be checked every 5 years, or more frequently if you are over 71 years old. Skin check. Lung cancer screening. You may have this screening every year starting at age 79 if you have a 30-pack-year history of smoking and currently smoke or have quit within the past 15 years. Fecal occult blood test (FOBT) of the stool. You may have this test every year starting at age 14. Flexible sigmoidoscopy or colonoscopy. You may have a sigmoidoscopy every 5 years or a  colonoscopy every 10 years starting at age 19. Hepatitis C blood test. Hepatitis B blood test. Sexually transmitted disease (STD) testing. Diabetes screening. This is done by checking your blood sugar (glucose) after you have not eaten for a while (fasting). You may have this done every 1-3 years. Bone density scan. This is done to screen for osteoporosis. You may have this done starting at age 22. Mammogram. This may be done every 1-2 years. Talk to your health care provider about how often you should have regular mammograms. Talk with your health care provider about your test results, treatment options, and if necessary, the need for more tests. Vaccines  Your health care provider may recommend certain vaccines, such as: Influenza vaccine. This is recommended every year. Tetanus, diphtheria, and acellular pertussis (Tdap, Td) vaccine. You may need a Td booster every 10 years. Zoster vaccine. You may need this after age 74. Pneumococcal 13-valent conjugate (PCV13) vaccine. One dose is recommended after age 72. Pneumococcal polysaccharide (PPSV23) vaccine. One dose is recommended after age 47. Talk to your health care provider about which screenings and vaccines you need and how often you need them. This information is not intended to replace advice given to you by your health care provider. Make sure you discuss any questions you have with your health care provider. Document Released: 04/11/2015 Document Revised: 12/03/2015 Document Reviewed: 01/14/2015 Elsevier Interactive Patient Education  2017 [place] Prevention in the Home Falls can cause injuries. They can happen to people of all ages. There are many things you can do to make your home safe and to help prevent falls. What can I do on the outside of my home? Regularly fix the edges of walkways and driveways and fix any cracks. Remove anything that might make you trip as you walk through a door, such as a raised step or  threshold. Trim any bushes or trees on the path to your home. Use bright outdoor lighting. Clear any walking paths of anything that might make someone trip, such as rocks or tools. Regularly check to see if handrails are loose or broken. Make sure that both sides of any steps have handrails. Any raised decks and porches should have guardrails on the edges. Have any leaves, snow, or ice cleared regularly. Use sand or salt on walking paths during winter. Clean up any spills in your garage right away. This includes oil or grease spills. What can I do in the bathroom? Use night lights. Install grab bars by the toilet and in the tub and shower. Do not use towel bars as grab bars. Use non-skid mats or decals in the tub or shower. If you need to sit down in the shower, use a plastic, non-slip stool. Keep the floor dry. Clean up any water that spills on  the floor as soon as it happens. Remove soap buildup in the tub or shower regularly. Attach bath mats securely with double-sided non-slip rug tape. Do not have throw rugs and other things on the floor that can make you trip. What can I do in the bedroom? Use night lights. Make sure that you have a light by your bed that is easy to reach. Do not use any sheets or blankets that are too big for your bed. They should not hang down onto the floor. Have a firm chair that has side arms. You can use this for support while you get dressed. Do not have throw rugs and other things on the floor that can make you trip. What can I do in the kitchen? Clean up any spills right away. Avoid walking on wet floors. Keep items that you use a lot in easy-to-reach places. If you need to reach something above you, use a strong step stool that has a grab bar. Keep electrical cords out of the way. Do not use floor polish or wax that makes floors slippery. If you must use wax, use non-skid floor wax. Do not have throw rugs and other things on the floor that can make you  trip. What can I do with my stairs? Do not leave any items on the stairs. Make sure that there are handrails on both sides of the stairs and use them. Fix handrails that are broken or loose. Make sure that handrails are as long as the stairways. Check any carpeting to make sure that it is firmly attached to the stairs. Fix any carpet that is loose or worn. Avoid having throw rugs at the top or bottom of the stairs. If you do have throw rugs, attach them to the floor with carpet tape. Make sure that you have a light switch at the top of the stairs and the bottom of the stairs. If you do not have them, ask someone to add them for you. What else can I do to help prevent falls? Wear shoes that: Do not have high heels. Have rubber bottoms. Are comfortable and fit you well. Are closed at the toe. Do not wear sandals. If you use a stepladder: Make sure that it is fully opened. Do not climb a closed stepladder. Make sure that both sides of the stepladder are locked into place. Ask someone to hold it for you, if possible. Clearly mark and make sure that you can see: Any grab bars or handrails. First and last steps. Where the edge of each step is. Use tools that help you move around (mobility aids) if they are needed. These include: Canes. Walkers. Scooters. Crutches. Turn on the lights when you go into a dark area. Replace any light bulbs as soon as they burn out. Set up your furniture so you have a clear path. Avoid moving your furniture around. If any of your floors are uneven, fix them. If there are any pets around you, be aware of where they are. Review your medicines with your doctor. Some medicines can make you feel dizzy. This can increase your chance of falling. Ask your doctor what other things that you can do to help prevent falls. This information is not intended to replace advice given to you by your health care provider. Make sure you discuss any questions you have with your  health care provider. Document Released: 01/09/2009 Document Revised: 08/21/2015 Document Reviewed: 04/19/2014 Elsevier Interactive Patient Education  2017 [employer].

## 2022-01-25 NOTE — Progress Notes (Signed)
Virtual Visit via Telephone Note  I connected with  Jasmine Bray on 01/25/22 at  1:00 PM EDT by telephone and verified that I am speaking with the correct person using two identifiers.  Location: Patient: Patient Provider: [place] Persons participating in the virtual visit: patient/Nurse Health Advisor   I discussed the limitations, risks, security and privacy concerns of performing an evaluation and management service by telephone and the availability of in person appointments. The patient expressed understanding and agreed to proceed.  Interactive audio and video telecommunications were attempted between this nurse and patient, however failed, due to patient having technical difficulties OR patient did not have access to video capability.  We continued and completed visit with audio only.  Some vital signs may be absent or patient reported.   Sheral Flow, LPN  Subjective:   Jasmine Bray is a 71 y.o. female who presents for Medicare Annual (Subsequent) preventive examination.  Review of Systems     Cardiac Risk Factors include: advanced age (>26mn, >>71women);dyslipidemia;family history of premature cardiovascular disease;hypertension     Objective:    Today's Vitals   01/25/22 1313  Weight: 152 lb (68.9 kg)  Height: '5\' 7"'$  (1.702 m)  PainSc: 0-No pain   Body mass index is 23.81 kg/m.     01/25/2022    1:05 PM 01/21/2021    4:43 PM 01/21/2020    3:15 PM 12/13/2018    1:29 PM 11/01/2017    4:31 PM 10/21/2016    3:31 PM 09/11/2013    3:24 PM  Advanced Directives  Does Patient Have a Medical Advance Directive? No No No No No No Patient does not have advance directive  Does patient want to make changes to medical advance directive?  Yes (MAU/Ambulatory/Procedural Areas - Information given)  No - Patient declined     Would patient like information on creating a medical advance directive? No - Patient declined  No - Patient declined  Yes (ED -  Information included in AVS) Yes (ED - Information included in AVS)   Pre-existing out of facility DNR order (yellow form or pink MOST form)       No    Current Medications (verified) Outpatient Encounter Medications as of 01/25/2022  Medication Sig   albuterol (PROAIR HFA) 108 (90 Base) MCG/ACT inhaler Inhale 2 puffs into the lungs every 6 (six) hours as needed for wheezing or shortness of breath.   ALPRAZolam (XANAX) 1 MG tablet Take 1 mg by mouth 3 (three) times daily as needed for anxiety.   amLODipine (NORVASC) 5 MG tablet TAKE 1 TABLET (5 MG TOTAL) BY MOUTH DAILY.   aspirin EC 81 MG tablet Take 81 mg by mouth daily.   benzonatate (TESSALON PERLES) 100 MG capsule 1-2 capsules up twice daily as needed for cough   bimatoprost (LATISSE) 0.03 % ophthalmic solution 1 drop to affected area Externally Once a day for 30 days   buPROPion (WELLBUTRIN XL) 150 MG 24 hr tablet Take 300 mg by mouth daily.   Cholecalciferol (VITAMIN D3) 3000 UNITS TABS Take 1 capsule by mouth daily. Taking 1000 units daily   doxycycline (VIBRA-TABS) 100 MG tablet Take 1 tablet (100 mg total) by mouth 2 (two) times daily.   escitalopram (LEXAPRO) 20 MG tablet Take 20 mg by mouth daily.   estradiol (ESTRACE) 0.1 MG/GM vaginal cream Place 2 g vaginally. 1 GRAM 3x A WEEK    estradiol (EVAMIST) 1.53 MG/SPRAY transdermal spray Evamist 1.53 mg/spray (1.7 %) transdermal  spray  APPLY 1 SPRAY DAILY AS DIRECTED   gabapentin (NEURONTIN) 300 MG capsule Take 1 capsule (300 mg total) by mouth 3 (three) times daily.   Garlic 195 MG TABS Take by mouth.   hydrochlorothiazide (HYDRODIURIL) 25 MG tablet Take 1 tablet by mouth every day   ibuprofen (ADVIL) 200 MG tablet Take 400 mg by mouth 2 (two) times daily as needed.   Ibuprofen-diphenhydrAMINE HCl (ADVIL PM) 200-25 MG CAPS Take 1 tablet by mouth at bedtime as needed.   LORazepam (ATIVAN) 1 MG tablet Take 1 mg by mouth daily as needed.   Multiple Vitamin (MULTIVITAMIN) tablet Take 1  tablet by mouth daily.   Multiple Vitamins-Minerals (HAIR SKIN AND NAILS FORMULA) TABS Take by mouth.   Omega-3 Fatty Acids (FISH OIL) 1000 MG CAPS Take by mouth daily.   Probiotic Product (PROBIOTIC PEARLS PO) Take 1 tablet by mouth daily.   simvastatin (ZOCOR) 40 MG tablet Take 1 tablet by mouth every day at bedtime   Turmeric 500 MG CAPS Take 1,000 mg by mouth daily.   valACYclovir (VALTREX) 1000 MG tablet Take 1,000 mg by mouth as needed. 1/2 by mouth once daily   No facility-administered encounter medications on file as of 01/25/2022.    Allergies (verified) Patient has no known allergies.   History: Past Medical History:  Diagnosis Date   Allergy    RHINITIS   Anxiety 1989   Dr Jake Michaelis, FP   Back injury    Depression    Dr Toy Care   Fibroids    GERD 11/11/2009   Qualifier: Diagnosis of  By: Linna Darner MD, Gwyndolyn Saxon   Dysphagia once monthly on average 4-10/14 ; resolved with Prilosec OTC prn No PMH of endoscopy     GERD (gastroesophageal reflux disease)    Hyperlipidemia    Hypertension    Past Surgical History:  Procedure Laterality Date   ABDOMINAL HYSTERECTOMY     & USO   ANAL FISTULA ECTOMY      X 2, Dr Rosana Hoes   COLONOSCOPY  2004, 2015   [place] Bilateral    [facility]   MYOMECTOMY     FOR FIBROIDS, Dr Ubaldo Glassing   steroid injection shoulder Right 05/11/2013   contusion/strain; Dr Onnie Graham   Family History  Problem Relation Age of Onset   Arthritis Mother    Stroke Mother 104   Alcohol abuse Father    Hyperlipidemia Sister    Hypertension Sister    Atrial fibrillation Sister    Breast cancer Paternal Aunt    Diabetes Maternal Grandmother    Heart disease Paternal Grandmother        CAD; no MI   Stroke Paternal Grandmother        in 97s   Stroke Paternal Grandfather        in late 63s   Colon cancer Neg Hx    Social History   Socioeconomic History   Marital status: Divorced    Spouse name: Not on file   Number of children: Not on file   Years of  education: Not on file   Highest education level: Not on file  Occupational History   Occupation: Press photographer  Tobacco Use   Smoking status: Never   Smokeless tobacco: Never  Vaping Use   Vaping Use: Never used  Substance and Sexual Activity   Alcohol use: Yes    Comment: decreasing wine intake   Drug use: No   Sexual activity: Not on file  Other  Topics Concern   Not on file  Social History Narrative   GETS REG EXERCISE         Social Determinants of Health   Financial Resource Strain: Low Risk  (01/25/2022)   Overall Financial Resource Strain (CARDIA)    Difficulty of Paying Living Expenses: Not hard at all  Food Insecurity: No Food Insecurity (01/25/2022)   Hunger Vital Sign    Worried About Running Out of Food in the Last Year: Never true    Ran Out of Food in the Last Year: Never true  Transportation Needs: No Transportation Needs (01/25/2022)   PRAPARE - Hydrologist (Medical): No    Lack of Transportation (Non-Medical): No  Physical Activity: Sufficiently Active (01/25/2022)   Exercise Vital Sign    Days of Exercise per Week: 7 days    Minutes of Exercise per Session: 60 min  Stress: No Stress Concern Present (01/25/2022)   [place]    Feeling of Stress : Not at all  Social Connections: Moderately Integrated (01/25/2022)   Social Connection and Isolation Panel [NHANES]    Frequency of Communication with Friends and Family: More than three times a week    Frequency of Social Gatherings with Friends and Family: More than three times a week    Attends Religious Services: More than 4 times per year    Active Member of [employer] or Organizations: Yes    Attends Music therapist: More than 4 times per year    Marital Status: Divorced    Tobacco Counseling Counseling given: Not Answered   Clinical Intake:  Pre-visit preparation completed: Yes  Pain : No/denies  pain Pain Score: 0-No pain     BMI - recorded: 23.81 Nutritional Status: BMI 25 -29 Overweight Nutritional Risks: None Diabetes: No  How often do you need to have someone help you when you read instructions, pamphlets, or other written materials from your doctor or pharmacy?: 1 - Never What is the last grade level you completed in school?: HSG; 3 years of college  Diabetic? no  Interpreter Needed?: No  Information entered by :: Lisette Abu, LPN.   Activities of Daily Living    01/25/2022    1:17 PM  In your present state of health, do you have any difficulty performing the following activities:  Hearing? 0  Vision? 0  Difficulty concentrating or making decisions? 0  Walking or climbing stairs? 0  Dressing or bathing? 0  Doing errands, shopping? 0  Preparing Food and eating ? N  Using the Toilet? N  In the past six months, have you accidently leaked urine? N  Do you have problems with loss of bowel control? N  Managing your Medications? N  Managing your Finances? N  Housekeeping or managing your Housekeeping? N    Patient Care Team: Binnie Rail, MD as PCP - General (Internal Medicine) Rosemarie Ax, MD as Consulting Physician (Family Medicine) Chucky May, MD as Consulting Physician (Psychiatry) Charlton Haws, [facility] as Pharmacist (Pharmacist) Opthamology, [facility] as Consulting Physician (Ophthalmology) [place], [facility] (Neurosurgery)  Indicate any recent Medical Services you may have received from other than [facility] providers in the past year (date may be approximate).     Assessment:   This is a routine wellness examination for [place].  Hearing/Vision screen Hearing Screening - Comments:: Denies hearing difficulties   Vision Screening - Comments:: Wears rx glasses - up  to date with routine eye exams with [facility]   Dietary issues and exercise activities discussed: Current Exercise Habits:  Home exercise routine, Type of exercise: walking;Other - see comments ([place] acre horse [place]), Time (Minutes): 60, Frequency (Times/Week): 5, Weekly Exercise (Minutes/Week): 300, Intensity: Moderate, Exercise limited by: None identified   Goals Addressed   None   Depression Screen    01/25/2022    1:09 PM 01/21/2021    4:42 PM 01/21/2020    3:13 PM 12/13/2018    1:30 PM 11/01/2017    4:33 PM 10/21/2016    3:37 PM 01/22/2016    9:44 AM  PHQ 2/9 Scores  PHQ - 2 Score 0 0 0 0 0 2 0  PHQ- 9 Score    0 1 3     Fall Risk    01/25/2022    1:06 PM 01/21/2021    4:44 PM 01/21/2020    3:15 PM 02/20/2019    1:43 PM 12/13/2018    1:30 PM  [place] in the past year? 0 1 1 0 0  Number falls in past yr: 0 0 0 0 0  Injury with Fall? 0 1 1  0  Risk for fall due to : No Fall Risks  Other (Comment)    Risk for fall due to: Comment   accidently triped and fell on her back    Follow up Falls prevention discussed  Falls evaluation completed      [place]:  Any stairs in or around the home? No  If so, are there any without handrails? No  Home free of loose throw rugs in walkways, pet beds, electrical cords, etc? Yes  Adequate lighting in your home to reduce risk of falls? Yes   ASSISTIVE DEVICES UTILIZED TO PREVENT FALLS:  Life alert? No  Use of a cane, walker or w/c? No  Grab bars in the bathroom? No  Shower chair or bench in shower? No  Elevated toilet seat or a handicapped toilet? No   TIMED UP AND GO:  Was the test performed? No . Phone Visit   Cognitive Function:        01/25/2022    1:18 PM  6CIT Screen  What Year? 0 points  What month? 0 points  What time? 0 points  Count back from 20 0 points  Months in reverse 0 points  Repeat phrase 0 points  Total Score 0 points    Immunizations Immunization History  Administered Date(s) Administered   Influenza Split 02/02/2011, 01/10/2012, 01/11/2020   Influenza Whole 12/22/2009    Influenza, High Dose Seasonal PF 01/22/2016, 01/24/2017, 01/25/2018, 01/25/2019, 01/08/2021   Influenza,inj,Quad PF,6+ Mos 01/17/2014   PFIZER(Purple Top)SARS-COV-2 Vaccination 05/13/2019, 06/05/2019, 01/11/2020   Pneumococcal Conjugate-13 03/08/2016   Pneumococcal Polysaccharide-23 02/20/2019   Td 11/11/2009   Tdap 07/02/2020   Zoster, Live 01/20/2015    TDAP status: Up to date  Flu Vaccine status: Due, Education has been provided regarding the importance of this vaccine. Advised may receive this vaccine at local pharmacy or Health Dept. Aware to provide a copy of the vaccination record if obtained from local pharmacy or Health Dept. Verbalized acceptance and understanding.  Pneumococcal vaccine status: Up to date  Covid-19 vaccine status: Completed vaccines  Qualifies for Shingles Vaccine? Yes   Zostavax completed Yes   Shingrix Completed?: No.    Education has been provided regarding the importance of this vaccine. Patient has been advised to  call insurance company to determine out of pocket expense if they have not yet received this vaccine. Advised may also receive vaccine at local pharmacy or Health Dept. Verbalized acceptance and understanding.  Screening Tests Health Maintenance  Topic Date Due   Zoster Vaccines- Shingrix (1 of 2) Never done   COVID-19 Vaccine (4 - Pfizer risk series) 03/07/2020   INFLUENZA VACCINE  10/27/2021   DEXA SCAN  12/02/2022   Medicare Annual Wellness (AWV)  01/26/2023   MAMMOGRAM  04/01/2023   COLONOSCOPY (Pts 45-91yr Insurance coverage will need to be confirmed)  09/26/2023   TETANUS/TDAP  07/03/2030   Pneumonia Vaccine 72 Years old  Completed   Hepatitis C Screening  Completed   HPV VACCINES  Aged Out    Health Maintenance  Health Maintenance Due  Topic Date Due   Zoster Vaccines- Shingrix (1 of 2) Never done   COVID-19 Vaccine (4 - Pfizer risk series) 03/07/2020   INFLUENZA VACCINE  10/27/2021    Colorectal cancer screening: Type  of screening: Colonoscopy. Completed 09/25/2013. Repeat every 10 years  Mammogram status: Completed 03/31/2021. Repeat every year  Bone Density status: Completed 12/01/2017. Results reflect: Bone density results: NORMAL. Repeat every 5 years.  Lung Cancer Screening: (Low Dose CT Chest recommended if Age 72-80years, 30 pack-year currently smoking OR have quit w/in 15years.) does not qualify.   Lung Cancer Screening Referral: no  Additional Screening:  Hepatitis C Screening: does qualify; Completed 01/24/2017  Vision Screening: Recommended annual ophthalmology exams for early detection of glaucoma and other disorders of the eye. Is the patient up to date with their annual eye exam?  Yes  Who is the provider or what is the name of the office in which the patient attends annual eye exams[facility] If pt is not established with a provider, would they like to be referred to a provider to establish care? No .   Dental Screening: Recommended annual dental exams for proper oral hygiene  Community Resource Referral / Chronic Care Management: CRR required this visit?  No   CCM required this visit?  No      Plan:     I have personally reviewed and noted the following in the patient's chart:   Medical and social history Use of alcohol, tobacco or illicit drugs  Current medications and supplements including opioid prescriptions. Patient is not currently taking opioid prescriptions. Functional ability and status Nutritional status Physical activity Advanced directives List of other physicians Hospitalizations, surgeries, and ER visits in previous 12 months Vitals Screenings to include cognitive, depression, and falls Referrals and appointments  In addition, I have reviewed and discussed with patient certain preventive protocols, quality metrics, and best practice recommendations. A written personalized care plan for preventive services as well as general preventive health  recommendations were provided to patient.     SSheral Flow LPN   [id number]  Nurse Notes: N/A

## 2022-02-03 DIAGNOSIS — L814 Other melanin hyperpigmentation: Secondary | ICD-10-CM | POA: Diagnosis not present

## 2022-02-03 DIAGNOSIS — L578 Other skin changes due to chronic exposure to nonionizing radiation: Secondary | ICD-10-CM | POA: Diagnosis not present

## 2022-02-03 DIAGNOSIS — L570 Actinic keratosis: Secondary | ICD-10-CM | POA: Diagnosis not present

## 2022-02-03 DIAGNOSIS — Z86006 Personal history of melanoma in-situ: Secondary | ICD-10-CM | POA: Diagnosis not present

## 2022-02-03 DIAGNOSIS — C44729 Squamous cell carcinoma of skin of left lower limb, including hip: Secondary | ICD-10-CM | POA: Diagnosis not present

## 2022-02-03 DIAGNOSIS — D2221 Melanocytic nevi of right ear and external auricular canal: Secondary | ICD-10-CM | POA: Diagnosis not present

## 2022-02-03 DIAGNOSIS — D485 Neoplasm of uncertain behavior of skin: Secondary | ICD-10-CM | POA: Diagnosis not present

## 2022-02-03 DIAGNOSIS — L821 Other seborrheic keratosis: Secondary | ICD-10-CM | POA: Diagnosis not present

## 2022-02-25 DIAGNOSIS — Z578 Occupational exposure to other risk factors: Secondary | ICD-10-CM | POA: Diagnosis not present

## 2022-02-25 DIAGNOSIS — C44729 Squamous cell carcinoma of skin of left lower limb, including hip: Secondary | ICD-10-CM | POA: Diagnosis not present

## 2022-03-11 ENCOUNTER — Other Ambulatory Visit: Payer: Self-pay | Admitting: Internal Medicine

## 2022-03-14 DIAGNOSIS — F419 Anxiety disorder, unspecified: Secondary | ICD-10-CM | POA: Insufficient documentation

## 2022-03-14 NOTE — Patient Instructions (Addendum)
Flu immunization administered today.     Blood work was ordered.   The lab is on the first floor.    Medications changes include :   none      Return in about 1 year (around 03/16/2023) for Physical Exam.   Health Maintenance, Female Adopting a healthy lifestyle and getting preventive care are important in promoting health and wellness. Ask your health care provider about: The right schedule for you to have regular tests and exams. Things you can do on your own to prevent diseases and keep yourself healthy. What should I know about diet, weight, and exercise? Eat a healthy diet  Eat a diet that includes plenty of vegetables, fruits, low-fat dairy products, and lean protein. Do not eat a lot of foods that are high in solid fats, added sugars, or sodium. Maintain a healthy weight Body mass index (BMI) is used to identify weight problems. It estimates body fat based on height and weight. Your health care provider can help determine your BMI and help you achieve or maintain a healthy weight. Get regular exercise Get regular exercise. This is one of the most important things you can do for your health. Most adults should: Exercise for at least 150 minutes each week. The exercise should increase your heart rate and make you sweat (moderate-intensity exercise). Do strengthening exercises at least twice a week. This is in addition to the moderate-intensity exercise. Spend less time sitting. Even light physical activity can be beneficial. Watch cholesterol and blood lipids Have your blood tested for lipids and cholesterol at 72 years of age, then have this test every 5 years. Have your cholesterol levels checked more often if: Your lipid or cholesterol levels are high. You are older than 72 years of age. You are at high risk for heart disease. What should I know about cancer screening? Depending on your health history and family history, you may need to have cancer screening at  various ages. This may include screening for: Breast cancer. Cervical cancer. Colorectal cancer. Skin cancer. Lung cancer. What should I know about heart disease, diabetes, and high blood pressure? Blood pressure and heart disease High blood pressure causes heart disease and increases the risk of stroke. This is more likely to develop in people who have high blood pressure readings or are overweight. Have your blood pressure checked: Every 3-5 years if you are 82-90 years of age. Every year if you are 45 years old or older. Diabetes Have regular diabetes screenings. This checks your fasting blood sugar level. Have the screening done: Once every three years after age 70 if you are at a normal weight and have a low risk for diabetes. More often and at a younger age if you are overweight or have a high risk for diabetes. What should I know about preventing infection? Hepatitis B If you have a higher risk for hepatitis B, you should be screened for this virus. Talk with your health care provider to find out if you are at risk for hepatitis B infection. Hepatitis C Testing is recommended for: Everyone born from [birth year] through 1966. Anyone with known risk factors for hepatitis C. Sexually transmitted infections (STIs) Get screened for STIs, including gonorrhea and chlamydia, if: You are sexually active and are younger than 72 years of age. You are older than 72 years of age and your health care provider tells you that you are at risk for this type of infection. Your sexual activity has changed since you  were last screened, and you are at increased risk for chlamydia or gonorrhea. Ask your health care provider if you are at risk. Ask your health care provider about whether you are at high risk for HIV. Your health care provider may recommend a prescription medicine to help prevent HIV infection. If you choose to take medicine to prevent HIV, you should first get tested for HIV. You should then be  tested every 3 months for as long as you are taking the medicine. Pregnancy If you are about to stop having your period (premenopausal) and you may become pregnant, seek counseling before you get pregnant. Take 400 to 800 micrograms (mcg) of folic acid every day if you become pregnant. Ask for birth control (contraception) if you want to prevent pregnancy. Osteoporosis and menopause Osteoporosis is a disease in which the bones lose minerals and strength with aging. This can result in bone fractures. If you are 81 years old or older, or if you are at risk for osteoporosis and fractures, ask your health care provider if you should: Be screened for bone loss. Take a calcium or vitamin D supplement to lower your risk of fractures. Be given hormone replacement therapy (HRT) to treat symptoms of menopause. Follow these instructions at home: Alcohol use Do not drink alcohol if: Your health care provider tells you not to drink. You are pregnant, may be pregnant, or are planning to become pregnant. If you drink alcohol: Limit how much you have to: 0-1 drink a day. Know how much alcohol is in your drink. In the [place] one drink equals one 12 oz bottle of beer (355 mL), one 5 oz glass of wine (148 mL), or one 1 oz glass of hard liquor (44 mL). Lifestyle Do not use any products that contain nicotine or tobacco. These products include cigarettes, chewing tobacco, and vaping devices, such as e-cigarettes. If you need help quitting, ask your health care provider. Do not use street drugs. Do not share needles. Ask your health care provider for help if you need support or information about quitting drugs. General instructions Schedule regular health, dental, and eye exams. Stay current with your vaccines. Tell your health care provider if: You often feel depressed. You have ever been abused or do not feel safe at home. Summary Adopting a healthy lifestyle and getting preventive care are important in  promoting health and wellness. Follow your health care provider's instructions about healthy diet, exercising, and getting tested or screened for diseases. Follow your health care provider's instructions on monitoring your cholesterol and blood pressure. This information is not intended to replace advice given to you by your health care provider. Make sure you discuss any questions you have with your health care provider. Document Revised: 08/04/2020 Document Reviewed: 08/04/2020 Elsevier Patient Education  2023 [employer].

## 2022-03-14 NOTE — Progress Notes (Unsigned)
Subjective:    Patient ID: Jasmine Bray, female    DOB: [birth year], 71 y.o.   MRN: [id number]      HPI Jasmine Bray is here for a Physical exam.   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  Sees dermatology every 6 months.  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  Reviewed her previous blood work which showed this in the-likely cleared virus   Medications and allergies reviewed with patient and updated if appropriate.  Current Outpatient Medications on File Prior to Visit  Medication Sig Dispense Refill   ALPRAZolam (XANAX) 1 MG tablet Take 1 mg by mouth 3 (three) times daily as needed for anxiety.     amLODipine (NORVASC) 5 MG tablet TAKE 1 TABLET (5 MG TOTAL) BY MOUTH DAILY. 90 tablet 1   aspirin EC 81 MG tablet Take 81 mg by mouth daily.     buPROPion (WELLBUTRIN XL) 150 MG 24 hr tablet Take 300 mg by mouth daily.     Cholecalciferol (VITAMIN D3) 3000 UNITS TABS Take 1 capsule by mouth daily. Taking 1000 units daily     escitalopram (LEXAPRO) 20 MG tablet Take 20 mg by mouth daily.     estradiol (ESTRACE) 0.1 MG/GM vaginal cream Place 2 g vaginally. 1 GRAM 3x A WEEK      estradiol (EVAMIST) 1.53 MG/SPRAY transdermal spray Evamist 1.53 mg/spray (1.7 %) transdermal spray  APPLY 1 SPRAY DAILY AS DIRECTED     gabapentin (NEURONTIN) 300 MG capsule Take 1 capsule (300 mg total) by mouth 3 (three) times daily. 180 capsule 3   hydrochlorothiazide (HYDRODIURIL) 25 MG tablet Take 1 tablet by mouth every day 90 tablet 2   ibuprofen (ADVIL) 200 MG tablet Take 400 mg by mouth 2 (two) times daily as needed.     Ibuprofen-diphenhydrAMINE HCl (ADVIL PM) 200-25 MG CAPS Take 1 tablet by mouth at bedtime as needed.     Multiple Vitamin (MULTIVITAMIN) tablet Take 1 tablet by mouth daily.     Multiple Vitamins-Minerals (HAIR SKIN AND NAILS FORMULA) TABS Take by mouth.     Omega-3 Fatty Acids (FISH OIL) 1000 MG CAPS Take by mouth daily.      Probiotic Product (PROBIOTIC PEARLS PO) Take 1 tablet by mouth daily.     simvastatin (ZOCOR) 40 MG tablet Take 1 tablet by mouth every day at bedtime 90 tablet 2   Turmeric 500 MG CAPS Take 1,000 mg by mouth daily.     valACYclovir (VALTREX) 1000 MG tablet Take 1,000 mg by mouth as needed. 1/2 by mouth once daily     No current facility-administered medications on file prior to visit.    Review of Systems  Constitutional:  Negative for chills and fever.  Eyes:  Negative for visual disturbance.  Respiratory:  Negative for cough, shortness of breath and wheezing.   Cardiovascular:  Negative for chest pain, palpitations and leg swelling.  Gastrointestinal:  Positive for constipation. Negative for abdominal pain, blood in stool, diarrhea and nausea.       No gerd  Genitourinary:  Negative for dysuria.  Musculoskeletal:  Negative for arthralgias and back pain.  Skin:  Negative for rash.  Neurological:  Negative for dizziness, light-headedness and headaches.  Psychiatric/Behavioral:  Positive for dysphoric mood. The patient is nervous/anxious.        Objective:   Vitals:   03/15/22 1512  BP: 132/80  Pulse: 65  Temp:  98 F (36.7 C)  SpO2: 97%   Filed Weights   03/15/22 1512  Weight: 188 lb (85.3 kg)   Body mass index is 29.44 kg/m.  BP Readings from Last 3 Encounters:  03/15/22 132/80  11/17/21 130/80  03/10/21 (!) 150/80    Wt Readings from Last 3 Encounters:  03/15/22 188 lb (85.3 kg)  01/25/22 152 lb (68.9 kg)  11/24/21 180 lb (81.6 kg)       Physical Exam Constitutional: She appears well-developed and well-nourished. No distress.  HENT:  Head: Normocephalic and atraumatic.  Right Ear: External ear normal. Normal ear canal and TM Left Ear: External ear normal.  Normal ear canal and TM Mouth/Throat: Oropharynx is clear and moist.  Eyes: Conjunctivae normal.  Neck: Neck supple. No tracheal deviation present. No thyromegaly present.  No carotid bruit   Cardiovascular: Normal rate, regular rhythm and normal heart sounds.   No murmur heard.  No edema. Pulmonary/Chest: Effort normal and breath sounds normal. No respiratory distress. She has no wheezes. She has no rales.  Breast: deferred   Abdominal: Soft. She exhibits no distension. There is no tenderness.  Lymphadenopathy: She has no cervical adenopathy.  Skin: Skin is warm and dry. She is not diaphoretic.  Psychiatric: She has a normal mood and affect. Her behavior is normal.     Lab Results  Component Value Date   WBC 7.5 03/10/2021   HGB 13.8 03/10/2021   HCT 41.4 03/10/2021   PLT 198.0 03/10/2021   GLUCOSE 82 03/10/2021   CHOL 200 03/10/2021   TRIG 118.0 03/10/2021   HDL 69.10 03/10/2021   LDLDIRECT 113.0 01/20/2015   LDLCALC 107 (H) 03/10/2021   ALT 18 03/10/2021   AST 20 03/10/2021   NA 134 (L) 03/10/2021   K 4.0 03/10/2021   CL 100 03/10/2021   CREATININE 0.62 03/10/2021   BUN 25 (H) 03/10/2021   CO2 27 03/10/2021   TSH 2.01 03/10/2021   HGBA1C 5.9 03/10/2021         Assessment & Plan:   Physical exam: Screening blood work  ordered Exercise not regular-plans to get back to the gym next month Weight working on weight loss Substance abuse  none   Reviewed recommended immunizations.  Flu immunization administered today.     Health Maintenance  Topic Date Due   Zoster Vaccines- Shingrix (1 of 2) Never done   INFLUENZA VACCINE  10/27/2021   COVID-19 Vaccine (4 - 2023-24 season) 11/27/2021   DEXA SCAN  12/02/2022   Medicare Annual Wellness (AWV)  01/26/2023   MAMMOGRAM  04/01/2023   COLONOSCOPY (Pts 45-36yr Insurance coverage will need to be confirmed)  09/26/2023   DTaP/Tdap/Td (3 - Td or Tdap) 07/03/2030   Pneumonia Vaccine 72 Years old  Completed   Hepatitis C Screening  Completed   HPV VACCINES  Aged Out          See Problem List for Assessment and Plan of chronic medical problems.

## 2022-03-15 ENCOUNTER — Encounter: Payer: Self-pay | Admitting: Internal Medicine

## 2022-03-15 ENCOUNTER — Ambulatory Visit (INDEPENDENT_AMBULATORY_CARE_PROVIDER_SITE_OTHER): Payer: PPO | Admitting: Internal Medicine

## 2022-03-15 VITALS — BP 132/80 | HR 65 | Temp 98.0°F | Ht 67.0 in | Wt 188.0 lb

## 2022-03-15 DIAGNOSIS — Z0000 Encounter for general adult medical examination without abnormal findings: Secondary | ICD-10-CM | POA: Diagnosis not present

## 2022-03-15 DIAGNOSIS — R7303 Prediabetes: Secondary | ICD-10-CM

## 2022-03-15 DIAGNOSIS — Z23 Encounter for immunization: Secondary | ICD-10-CM | POA: Diagnosis not present

## 2022-03-15 DIAGNOSIS — F419 Anxiety disorder, unspecified: Secondary | ICD-10-CM

## 2022-03-15 DIAGNOSIS — F3289 Other specified depressive episodes: Secondary | ICD-10-CM | POA: Diagnosis not present

## 2022-03-15 DIAGNOSIS — I10 Essential (primary) hypertension: Secondary | ICD-10-CM | POA: Diagnosis not present

## 2022-03-15 DIAGNOSIS — E782 Mixed hyperlipidemia: Secondary | ICD-10-CM | POA: Diagnosis not present

## 2022-03-15 LAB — CBC WITH DIFFERENTIAL/PLATELET
Basophils Absolute: 0.1 10*3/uL (ref 0.0–0.1)
Basophils Relative: 0.9 % (ref 0.0–3.0)
Eosinophils Absolute: 0.2 10*3/uL (ref 0.0–0.7)
Eosinophils Relative: 3 % (ref 0.0–5.0)
HCT: 41.6 % (ref 36.0–46.0)
Hemoglobin: 14.2 g/dL (ref 12.0–15.0)
Lymphocytes Relative: 31.4 % (ref 12.0–46.0)
Lymphs Abs: 2.5 10*3/uL (ref 0.7–4.0)
MCHC: 34.1 g/dL (ref 30.0–36.0)
MCV: 93.2 fl (ref 78.0–100.0)
Monocytes Absolute: 0.6 10*3/uL (ref 0.1–1.0)
Monocytes Relative: 7.4 % (ref 3.0–12.0)
Neutro Abs: 4.5 10*3/uL (ref 1.4–7.7)
Neutrophils Relative %: 57.3 % (ref 43.0–77.0)
Platelets: 221 10*3/uL (ref 150.0–400.0)
RBC: 4.46 Mil/uL (ref 3.87–5.11)
RDW: 13.6 % (ref 11.5–15.5)
WBC: 7.8 10*3/uL (ref 4.0–10.5)

## 2022-03-15 LAB — COMPREHENSIVE METABOLIC PANEL
ALT: 28 U/L (ref 0–35)
AST: 41 U/L — ABNORMAL HIGH (ref 0–37)
Albumin: 4.6 g/dL (ref 3.5–5.2)
Alkaline Phosphatase: 52 U/L (ref 39–117)
BUN: 12 mg/dL (ref 6–23)
CO2: 29 mEq/L (ref 19–32)
Calcium: 9.6 mg/dL (ref 8.4–10.5)
Chloride: 98 mEq/L (ref 96–112)
Creatinine, Ser: 0.68 mg/dL (ref 0.40–1.20)
GFR: 87.21 mL/min (ref >60.00)
Glucose, Bld: 94 mg/dL (ref 70–99)
Potassium: 3.4 mEq/L — ABNORMAL LOW (ref 3.5–5.1)
Sodium: 137 mEq/L (ref 135–145)
Total Bilirubin: 0.5 mg/dL (ref 0.2–1.2)
Total Protein: 7.5 g/dL (ref 6.0–8.3)

## 2022-03-15 LAB — LIPID PANEL
Cholesterol: 169 mg/dL (ref 0–200)
HDL: 56.3 mg/dL (ref >39.00)
LDL Cholesterol: 82 mg/dL (ref 0–99)
NonHDL: 112.39
Total CHOL/HDL Ratio: 3
Triglycerides: 153 mg/dL — ABNORMAL HIGH (ref 0.0–149.0)
VLDL: 30.6 mg/dL (ref 0.0–40.0)

## 2022-03-15 LAB — TSH: TSH: 1.35 u[IU]/mL (ref 0.35–5.50)

## 2022-03-15 LAB — HEMOGLOBIN A1C: Hgb A1c MFr Bld: 5.7 % (ref 4.6–6.5)

## 2022-03-15 NOTE — Assessment & Plan Note (Signed)
Chronic Regular exercise and healthy diet encouraged Check lipid panel, CMP, TSH Continue simvastatin 40 mg daily

## 2022-03-15 NOTE — Assessment & Plan Note (Signed)
Chronic Check a1c Low sugar / carb diet Stressed regular exercise  

## 2022-03-15 NOTE — Assessment & Plan Note (Signed)
Chronic Management per psychiatry 

## 2022-03-15 NOTE — Assessment & Plan Note (Signed)
Chronic Blood pressure well controlled CMP Continue amlodipine 5 mg daily, hydrochlorothiazide 25 mg daily

## 2022-04-05 DIAGNOSIS — H25813 Combined forms of age-related cataract, bilateral: Secondary | ICD-10-CM | POA: Diagnosis not present

## 2022-04-05 DIAGNOSIS — H04123 Dry eye syndrome of bilateral lacrimal glands: Secondary | ICD-10-CM | POA: Diagnosis not present

## 2022-04-05 DIAGNOSIS — H10413 Chronic giant papillary conjunctivitis, bilateral: Secondary | ICD-10-CM | POA: Diagnosis not present

## 2022-04-21 ENCOUNTER — Ambulatory Visit: Payer: PPO | Admitting: Family Medicine

## 2022-05-03 ENCOUNTER — Ambulatory Visit (INDEPENDENT_AMBULATORY_CARE_PROVIDER_SITE_OTHER): Payer: PPO | Admitting: Family Medicine

## 2022-05-03 ENCOUNTER — Encounter: Payer: Self-pay | Admitting: Family Medicine

## 2022-05-03 VITALS — BP 132/80 | Ht 67.0 in | Wt 185.0 lb

## 2022-05-03 DIAGNOSIS — M12811 Other specific arthropathies, not elsewhere classified, right shoulder: Secondary | ICD-10-CM

## 2022-05-03 NOTE — Progress Notes (Signed)
  Jasmine Bray - 72 y.o. female MRN [id number]  Date of birth: [birth year]  SUBJECTIVE:  Including CC & ROS.  No chief complaint on file.   Jasmine Bray is a 72 y.o. female that is presenting with acute on chronic right shoulder pain.  The pain has been ongoing since last year.  We have tried different injections as well as therapies.  Continues to have pain and weakness at the shoulder.  Previous imaging has been unrevealing.   Independent review of the right shoulder x-ray degenerative changes of the glenohumeral joint and findings suggestive of rotator cuff arthropathy.  Review of Systems See HPI   HISTORY: Past Medical, Surgical, Social, and Family History Reviewed & Updated per EMR.   Pertinent Historical Findings include:  Past Medical History:  Diagnosis Date   Allergy    RHINITIS   Anxiety 1989   Dr Jake Michaelis, FP   Back injury    Depression    Dr Toy Care   Fibroids    GERD 11/11/2009   Qualifier: Diagnosis of  By: Linna Darner MD, Gwyndolyn Saxon   Dysphagia once monthly on average 4-10/14 ; resolved with Prilosec OTC prn No PMH of endoscopy     GERD (gastroesophageal reflux disease)    Hyperlipidemia    Hypertension     Past Surgical History:  Procedure Laterality Date   ABDOMINAL HYSTERECTOMY     & USO   ANAL FISTULA ECTOMY      X 2, Dr Rosana Hoes   COLONOSCOPY  2004, 2015   [facility]   INJECTION KNEE Bilateral    Bioflex   MYOMECTOMY     FOR FIBROIDS, Dr Ubaldo Glassing   steroid injection shoulder Right 05/11/2013   contusion/strain; Dr Onnie Graham     PHYSICAL EXAM:  VS: BP 132/80   Ht '5\' 7"'$  (1.702 m)   Wt 185 lb (83.9 kg)   BMI 28.98 kg/m  Physical Exam Gen: NAD, alert, cooperative with exam, well-appearing MSK:  Right shoulder: Limited range of action. Weakness with resistance to flexion and extension. Positive O'Brien's test and can test Neurovascularly intact       ASSESSMENT & PLAN:   Rotator cuff arthropathy of right shoulder Acute on chronic in nature.  She has  tried different injections.  She has tried home exercise therapy program for 6 weeks.  This involved external rotation and internal rotation resistance training that was done at 3 sets of 10 x 10.  She also performed supraspinatus exercising with resistance doing 3 sets of 10.  X-rays were unrevealing as to the source. -Counseled on home exercise therapy and supportive - could consider nerve block

## 2022-05-03 NOTE — Assessment & Plan Note (Signed)
Acute on chronic in nature.  She has tried different injections.  She has tried home exercise therapy program for 6 weeks.  This involved external rotation and internal rotation resistance training that was done at 3 sets of 10 x 10.  She also performed supraspinatus exercising with resistance doing 3 sets of 10.  X-rays were unrevealing as to the source. -Counseled on home exercise therapy and supportive - could consider nerve block

## 2022-05-14 ENCOUNTER — Telehealth: Payer: PPO

## 2022-05-24 ENCOUNTER — Ambulatory Visit
Admission: RE | Admit: 2022-05-24 | Discharge: 2022-05-24 | Disposition: A | Discharge status: Home / Self Care | Payer: PPO | Source: Ambulatory Visit | Attending: Family Medicine | Admitting: Family Medicine

## 2022-05-24 DIAGNOSIS — M12811 Other specific arthropathies, not elsewhere classified, right shoulder: Secondary | ICD-10-CM

## 2022-05-24 DIAGNOSIS — G8929 Other chronic pain: Secondary | ICD-10-CM | POA: Diagnosis not present

## 2022-05-24 DIAGNOSIS — S46011A Strain of muscle(s) and tendon(s) of the rotator cuff of right shoulder, initial encounter: Secondary | ICD-10-CM | POA: Diagnosis not present

## 2022-05-27 ENCOUNTER — Telehealth (INDEPENDENT_AMBULATORY_CARE_PROVIDER_SITE_OTHER): Payer: PPO | Admitting: Family Medicine

## 2022-05-27 ENCOUNTER — Encounter: Payer: Self-pay | Admitting: Family Medicine

## 2022-05-27 VITALS — Ht 67.0 in | Wt 185.0 lb

## 2022-05-27 DIAGNOSIS — S46011D Strain of muscle(s) and tendon(s) of the rotator cuff of right shoulder, subsequent encounter: Secondary | ICD-10-CM

## 2022-05-27 NOTE — Progress Notes (Signed)
Virtual Visit via Video Note  I connected with Jasmine Bray on 05/27/22 at 11:30 AM EST by a video enabled telemedicine application and verified that I am speaking with the correct person using two identifiers.  Location: Patient: home Provider: office   I discussed the limitations of evaluation and management by telemedicine and the availability of in person appointments. The patient expressed understanding and agreed to proceed.  History of Present Illness:  Jasmine Bray is a 72 year old female that is following up after the MRI of her right shoulder.  This was demonstrating a complete tear of the supraspinatus tendon with a 2.9 cm retraction.   Observations/Objective:   Assessment and Plan:  Return to cuff tear of the right shoulder: Acute on chronic in nature.  MRI was confirming a complete tear of the supraspinatus with retraction -Counseled on home exercise therapy and supportive care. -Referral to orthopedic surgery.  Follow Up Instructions:    I discussed the assessment and treatment plan with the patient. The patient was provided an opportunity to ask questions and all were answered. The patient agreed with the plan and demonstrated an understanding of the instructions.   The patient was advised to call back or seek an in-person evaluation if the symptoms worsen or if the condition fails to improve as anticipated.   Clearance Coots, MD

## 2022-05-27 NOTE — Assessment & Plan Note (Signed)
Acute on chronic in nature.  MRI was revealing for a complete tear with retraction. -Counseled on home exercise therapy and supportive care. -Referral to orthopedic surgery.

## 2022-06-08 ENCOUNTER — Telehealth (INDEPENDENT_AMBULATORY_CARE_PROVIDER_SITE_OTHER): Payer: PPO | Admitting: Family Medicine

## 2022-06-08 DIAGNOSIS — R0981 Nasal congestion: Secondary | ICD-10-CM

## 2022-06-08 DIAGNOSIS — R051 Acute cough: Secondary | ICD-10-CM | POA: Diagnosis not present

## 2022-06-08 MED ORDER — BENZONATATE 100 MG PO CAPS: ORAL_CAPSULE | ORAL | 0 refills | Status: DC

## 2022-06-08 NOTE — Patient Instructions (Signed)
-  I sent the medication(s) we discussed to your pharmacy: Meds ordered this encounter  Medications   benzonatate (TESSALON PERLES) 100 MG capsule    Sig: 1-2 capsules up to twice daily as needed for cough.    Dispense:  30 capsule    Refill:  0     I hope you are feeling better soon!  Seek in person care promptly if your symptoms worsen, new concerns arise or you are not improving with treatment.  It was nice to meet you today. I help [facility] out with telemedicine visits on Tuesdays and Thursdays and am happy to help if you need a virtual follow up visit on those days. Otherwise, if you have any concerns or questions following this visit please schedule a follow up visit with your Primary Care office or seek care at a local urgent care clinic to avoid delays in care. If you are having severe or life threatening symptoms please call 911 and/or go to the nearest emergency room.   

## 2022-06-08 NOTE — Progress Notes (Signed)
Virtual Visit via Video Note  I connected with Jasmine Bray  on 06/08/22 at  1:20 PM EDT by a video enabled telemedicine application and verified that I am speaking with the correct person using two identifiers.  Location patient: [place] Location provider:work or home office Persons participating in the virtual visit: patient, provider  I discussed the limitations and requested verbal permission for telemedicine visit. The patient expressed understanding and agreed to proceed.   HPI:  Acute telemedicine visit for cough and congestion: -Onset: about 3 days ago -Symptoms include: sinus congestion, low energy, sneezing, cough, runny nose -Denies:fever, CP, SOB, NVD, body aches -Has tried: advil, nasal spray -Pertinent past medical history: see below, reports has a hx of sinus infections -Pertinent medication allergies:No Known Allergies -COVID-19 vaccine status:  Immunization History  Administered Date(s) Administered   Fluad Quad(high Dose 65+) 03/15/2022   Influenza Split 02/02/2011, 01/10/2012, 01/11/2020   Influenza Whole 12/22/2009   Influenza, High Dose Seasonal PF 01/22/2016, 01/24/2017, 01/25/2018, 01/25/2019, 01/08/2021   Influenza,inj,Quad PF,6+ Mos 01/17/2014   PFIZER(Purple Top)SARS-COV-2 Vaccination 05/13/2019, 06/05/2019, 01/11/2020   Pneumococcal Conjugate-13 03/08/2016   Pneumococcal Polysaccharide-23 02/20/2019   Td 11/11/2009   Tdap 07/02/2020   Zoster, Live 01/20/2015     ROS: See pertinent positives and negatives per HPI.  Past Medical History:  Diagnosis Date   Allergy    RHINITIS   Anxiety 1989   Dr Jake Michaelis, FP   Back injury    Depression    Dr Toy Care   Fibroids    GERD 11/11/2009   Qualifier: Diagnosis of  By: Linna Darner MD, Gwyndolyn Saxon   Dysphagia once monthly on average 4-10/14 ; resolved with Prilosec OTC prn No PMH of endoscopy     GERD (gastroesophageal reflux disease)    Hyperlipidemia    Hypertension     Past Surgical History:  Procedure Laterality Date    ABDOMINAL HYSTERECTOMY     & USO   ANAL FISTULA ECTOMY      X 2, Dr Rosana Hoes   COLONOSCOPY  2004, 2015   [facility]   INJECTION KNEE Bilateral    Bioflex   MYOMECTOMY     FOR FIBROIDS, Dr Ubaldo Glassing   steroid injection shoulder Right 05/11/2013   contusion/strain; Dr Onnie Graham     Current Outpatient Medications:    ALPRAZolam (XANAX) 1 MG tablet, Take 1 mg by mouth 3 (three) times daily as needed for anxiety., Disp: , Rfl:    amLODipine (NORVASC) 5 MG tablet, TAKE 1 TABLET (5 MG TOTAL) BY MOUTH DAILY., Disp: 90 tablet, Rfl: 1   aspirin EC 81 MG tablet, Take 81 mg by mouth daily., Disp: , Rfl:    benzonatate (TESSALON PERLES) 100 MG capsule, 1-2 capsules up to twice daily as needed for cough., Disp: 30 capsule, Rfl: 0   buPROPion (WELLBUTRIN XL) 150 MG 24 hr tablet, Take 300 mg by mouth daily., Disp: , Rfl:    Cholecalciferol (VITAMIN D3) 3000 UNITS TABS, Take 1 capsule by mouth daily. Taking 1000 units daily, Disp: , Rfl:    escitalopram (LEXAPRO) 20 MG tablet, Take 20 mg by mouth daily., Disp: , Rfl:    estradiol (ESTRACE) 0.1 MG/GM vaginal cream, Place 2 g vaginally. 1 GRAM 3x A WEEK , Disp: , Rfl:    estradiol (EVAMIST) 1.53 MG/SPRAY transdermal spray, Evamist 1.53 mg/spray (1.7 %) transdermal spray  APPLY 1 SPRAY DAILY AS DIRECTED, Disp: , Rfl:    hydrochlorothiazide (HYDRODIURIL) 25 MG tablet, Take 1 tablet by mouth every day, Disp: 90  tablet, Rfl: 2   ibuprofen (ADVIL) 200 MG tablet, Take 400 mg by mouth 2 (two) times daily as needed., Disp: , Rfl:    Ibuprofen-diphenhydrAMINE HCl (ADVIL PM) 200-25 MG CAPS, Take 1 tablet by mouth at bedtime as needed., Disp: , Rfl:    Multiple Vitamin (MULTIVITAMIN) tablet, Take 1 tablet by mouth daily., Disp: , Rfl:    Multiple Vitamins-Minerals (HAIR SKIN AND NAILS FORMULA) TABS, Take by mouth., Disp: , Rfl:    Omega-3 Fatty Acids (FISH OIL) 1000 MG CAPS, Take by mouth daily., Disp: , Rfl:    Probiotic Product (PROBIOTIC PEARLS PO), Take 1 tablet by mouth  daily., Disp: , Rfl:    simvastatin (ZOCOR) 40 MG tablet, Take 1 tablet by mouth every day at bedtime, Disp: 90 tablet, Rfl: 2   Turmeric 500 MG CAPS, Take 1,000 mg by mouth daily., Disp: , Rfl:    valACYclovir (VALTREX) 1000 MG tablet, Take 1,000 mg by mouth as needed. 1/2 by mouth once daily, Disp: , Rfl:    gabapentin (NEURONTIN) 300 MG capsule, Take 1 capsule (300 mg total) by mouth 3 (three) times daily. (Patient not taking: Reported on 06/08/2022), Disp: 180 capsule, Rfl: 3  EXAM:  VITALS per patient if applicable:  GENERAL: alert, oriented, appears well and in no acute distress  HEENT: atraumatic, conjunttiva clear, no obvious abnormalities on inspection of external nose and ears  NECK: normal movements of the head and neck  LUNGS: on inspection no signs of respiratory distress, breathing rate appears normal, no obvious gross SOB, gasping or wheezing  CV: no obvious cyanosis  MS: moves all visible extremities without noticeable abnormality  PSYCH/NEURO: pleasant and cooperative, no obvious depression or anxiety, speech and thought processing grossly intact  ASSESSMENT AND PLAN:  Discussed the following assessment and plan:  Nasal congestion  Acute cough  -we discussed possible serious and likely etiologies, options for evaluation and workup, limitations of telemedicine visit vs in person visit, treatment, treatment risks and precautions. Pt is agreeable to treatment via telemedicine at this moment. Query [facility] vs other. She had neg covid test - discussed repeating if she wishes since was expired. Opted for nasal saline sinus rinses, Tessalon rx fo cough and advised to seek prompt virtual visit or in person care if worsening, new symptoms arise, or if is not improving with treatment as expected per our conversation of expected course. Discussed options for follow up care. Did let this patient know that I do telemedicine on Tuesdays and Thursdays for [facility] and those are the days  I am logged into the system. Advised to schedule follow up visit with PCP, [facility] virtual visits or [facility] if any further questions or concerns to avoid delays in care.   I discussed the assessment and treatment plan with the patient. The patient was provided an opportunity to ask questions and all were answered. The patient agreed with the plan and demonstrated an understanding of the instructions.     Lucretia Kern, DO

## 2022-06-09 ENCOUNTER — Encounter: Payer: Self-pay | Admitting: Family Medicine

## 2022-06-16 DIAGNOSIS — Z79890 Hormone replacement therapy (postmenopausal): Secondary | ICD-10-CM | POA: Diagnosis not present

## 2022-06-16 DIAGNOSIS — Z01419 Encounter for gynecological examination (general) (routine) without abnormal findings: Secondary | ICD-10-CM | POA: Diagnosis not present

## 2022-06-16 DIAGNOSIS — Z780 Asymptomatic menopausal state: Secondary | ICD-10-CM | POA: Diagnosis not present

## 2022-06-16 DIAGNOSIS — N952 Postmenopausal atrophic vaginitis: Secondary | ICD-10-CM | POA: Diagnosis not present

## 2022-06-21 DIAGNOSIS — Z1231 Encounter for screening mammogram for malignant neoplasm of breast: Secondary | ICD-10-CM | POA: Diagnosis not present

## 2022-07-12 ENCOUNTER — Encounter: Payer: Self-pay | Admitting: *Deleted

## 2022-08-18 ENCOUNTER — Other Ambulatory Visit: Payer: Self-pay | Admitting: Internal Medicine

## 2022-09-10 ENCOUNTER — Other Ambulatory Visit: Payer: Self-pay | Admitting: Internal Medicine

## 2022-11-15 ENCOUNTER — Telehealth: Payer: Self-pay | Admitting: Internal Medicine

## 2022-11-15 ENCOUNTER — Other Ambulatory Visit: Payer: Self-pay

## 2022-11-15 MED ORDER — SIMVASTATIN 40 MG PO TABS: ORAL_TABLET | ORAL | 2 refills | Status: DC

## 2022-11-15 NOTE — Telephone Encounter (Signed)
Script faxed in today. 

## 2022-11-15 NOTE — Telephone Encounter (Signed)
Prescription Request  11/15/2022  LOV: 03/15/2022  What is the name of the medication or equipment? simvastatin  Have you contacted your pharmacy to request a refill? Yes   Which pharmacy would you like this sent to?   [id number] - [place], [place] - [address] [place] [address] Phone: [phone] Fax: [phone]    Patient notified that their request is being sent to the clinical staff for review and that they should receive a response within 2 business days.   Please advise at Mobile [phone] (mobile)

## 2022-11-23 ENCOUNTER — Other Ambulatory Visit: Payer: Self-pay | Admitting: Internal Medicine

## 2022-11-26 ENCOUNTER — Other Ambulatory Visit: Payer: Self-pay | Admitting: Internal Medicine

## 2022-12-13 ENCOUNTER — Telehealth: Payer: Self-pay | Admitting: Internal Medicine

## 2022-12-13 NOTE — Telephone Encounter (Signed)
Okay for virtual visit although not ideal.  Just let her know it is very limited in what I am able to see virtually, but okay if she wants virtual

## 2022-12-13 NOTE — Telephone Encounter (Signed)
Pt called stating that she would like to speak with her nurse when ever she gets a chance about all her medications. She's breaking out on her face as well and wanted to know could she get a prescription of prednisone.

## 2022-12-13 NOTE — Telephone Encounter (Signed)
Pt states she wants a virtual instead if possible.

## 2022-12-14 DIAGNOSIS — L255 Unspecified contact dermatitis due to plants, except food: Secondary | ICD-10-CM | POA: Diagnosis not present

## 2022-12-14 NOTE — Telephone Encounter (Signed)
Message left for her today to contact office and set up virtual but that appointment would be limited.

## 2023-01-04 ENCOUNTER — Ambulatory Visit (INDEPENDENT_AMBULATORY_CARE_PROVIDER_SITE_OTHER): Payer: PPO

## 2023-01-04 VITALS — Ht 67.0 in | Wt 185.0 lb

## 2023-01-04 DIAGNOSIS — Z0000 Encounter for general adult medical examination without abnormal findings: Secondary | ICD-10-CM

## 2023-01-04 NOTE — Patient Instructions (Addendum)
Jasmine Bray , Thank you for taking time to come for your Medicare Wellness Visit. I appreciate your ongoing commitment to your health goals. Please review the following plan we discussed and let me know if I can assist you in the future.   Referrals/Orders/Follow-Ups/Clinician Recommendations: No  This is a list of the screening recommended for you and due dates:  Health Maintenance  Topic Date Due   Zoster (Shingles) Vaccine (1 of 2) 02/12/1970   Flu Shot  10/28/2022   COVID-19 Vaccine (4 - 2023-24 season) 11/28/2022   DEXA scan (bone density measurement)  12/02/2022   Mammogram  04/01/2023   Colon Cancer Screening  09/26/2023   Medicare Annual Wellness Visit  01/04/2024   DTaP/Tdap/Td vaccine (3 - Td or Tdap) 07/03/2030   Pneumonia Vaccine  Completed   Hepatitis C Screening  Completed   HPV Vaccine  Aged Out    Advanced directives: (Declined) Advance directive discussed with you today. Even though you declined this today, please call our office should you change your mind, and we can give you the proper paperwork for you to fill out.  Next Medicare Annual Wellness Visit scheduled for next year: Yes

## 2023-01-04 NOTE — Progress Notes (Addendum)
Subjective:   Jasmine Bray is a 72 y.o. female who presents for Medicare Annual (Subsequent) preventive examination.  Visit Complete: Virtual I connected with  Jasmine Bray on 01/04/23 by a audio enabled telemedicine application and verified that I am speaking with the correct person using two identifiers.  Patient Location: Home  Provider Location: Office/Clinic  I discussed the limitations of evaluation and management by telemedicine. The patient expressed understanding and agreed to proceed.  Vital Signs: Because this visit was a virtual/telehealth visit, some criteria may be missing or patient reported. Any vitals not documented were not able to be obtained and vitals that have been documented are patient reported.  Patient Medicare AWV questionnaire was completed by the patient on 01/03/2023; I have confirmed that all information answered by patient is correct and no changes since this date.  Cardiac Risk Factors include: advanced age (>32men, >22 women);dyslipidemia;family history of premature cardiovascular disease;hypertension     Objective:    Today's Vitals   01/04/23 1110 01/04/23 1111  Weight: 185 lb (83.9 kg)   Height: 5\' 7"  (1.702 m)   PainSc: 5  5   PainLoc: Shoulder    Body mass index is 28.98 kg/m.     01/04/2023   11:15 AM 01/25/2022    1:05 PM 01/21/2021    4:43 PM 01/21/2020    3:15 PM 12/13/2018    1:29 PM 11/01/2017    4:31 PM 10/21/2016    3:31 PM  Advanced Directives  Does Patient Have a Medical Advance Directive? No No No No No No No  Does patient want to make changes to medical advance directive?   Yes (MAU/Ambulatory/Procedural Areas - Information given)  No - Patient declined    Would patient like information on creating a medical advance directive? No - Patient declined No - Patient declined  No - Patient declined  Yes (ED - Information included in AVS) Yes (ED - Information included in AVS)    Current Medications (verified) Outpatient  Encounter Medications as of 01/04/2023  Medication Sig   ALPRAZolam (XANAX) 1 MG tablet Take 1 mg by mouth 3 (three) times daily as needed for anxiety.   amLODipine (NORVASC) 5 MG tablet TAKE 1 TABLET (5 MG TOTAL) BY MOUTH DAILY.   aspirin EC 81 MG tablet Take 81 mg by mouth daily.   benzonatate (TESSALON PERLES) 100 MG capsule 1-2 capsules up to twice daily as needed for cough.   buPROPion (WELLBUTRIN XL) 150 MG 24 hr tablet Take 300 mg by mouth daily.   Cholecalciferol (VITAMIN D3) 3000 UNITS TABS Take 1 capsule by mouth daily. Taking 1000 units daily   escitalopram (LEXAPRO) 20 MG tablet Take 20 mg by mouth daily.   estradiol (ESTRACE) 0.1 MG/GM vaginal cream Place 2 g vaginally. 1 GRAM 3x A WEEK    estradiol (EVAMIST) 1.53 MG/SPRAY transdermal spray Evamist 1.53 mg/spray (1.7 %) transdermal spray  APPLY 1 SPRAY DAILY AS DIRECTED   hydrochlorothiazide (HYDRODIURIL) 25 MG tablet Take 1 tablet by mouth every day   ibuprofen (ADVIL) 200 MG tablet Take 400 mg by mouth 2 (two) times daily as needed.   Ibuprofen-diphenhydrAMINE HCl (ADVIL PM) 200-25 MG CAPS Take 1 tablet by mouth at bedtime as needed.   Multiple Vitamin (MULTIVITAMIN) tablet Take 1 tablet by mouth daily.   Multiple Vitamins-Minerals (HAIR SKIN AND NAILS FORMULA) TABS Take by mouth.   Omega-3 Fatty Acids (FISH OIL) 1000 MG CAPS Take by mouth daily.   Probiotic Product (PROBIOTIC  PEARLS PO) Take 1 tablet by mouth daily.   simvastatin (ZOCOR) 40 MG tablet Take 1 tablet by mouth every day at bedtime   Turmeric 500 MG CAPS Take 1,000 mg by mouth daily.   valACYclovir (VALTREX) 1000 MG tablet TAKE 1/2 TABLET BY MOUTH X 3-5 DAYS AS NEEDED   gabapentin (NEURONTIN) 300 MG capsule Take 1 capsule (300 mg total) by mouth 3 (three) times daily. (Patient not taking: Reported on 06/08/2022)   No facility-administered encounter medications on file as of 01/04/2023.    Allergies (verified) Patient has no known allergies.   History: Past  Medical History:  Diagnosis Date   Allergy    RHINITIS   Anxiety 1989   Dr Lorenz Coaster, [facility]   Back injury    Depression    Dr Evelene Croon   Fibroids    GERD 11/11/2009   Qualifier: Diagnosis of  By: Alwyn Ren MD, Chrissie Noa   Dysphagia once monthly on average 4-10/14 ; resolved with Prilosec OTC prn No PMH of endoscopy     GERD (gastroesophageal reflux disease)    Hyperlipidemia    Hypertension    Past Surgical History:  Procedure Laterality Date   ABDOMINAL HYSTERECTOMY     & USO   ANAL FISTULA ECTOMY      X 2, Dr Earlene Plater   COLONOSCOPY  2004, 2015   [facility]   INJECTION KNEE Bilateral    [facility]   MYOMECTOMY     FOR FIBROIDS, Dr Nicholas Lose   steroid injection shoulder Right 05/11/2013   contusion/strain; Dr Rennis Chris   Family History  Problem Relation Age of Onset   Arthritis Mother    Stroke Mother 17   Alcohol abuse Father    Hyperlipidemia Sister    Hypertension Sister    Atrial fibrillation Sister    Breast cancer Paternal Aunt    Diabetes Maternal Grandmother    Heart disease Paternal Grandmother        CAD; no MI   Stroke Paternal Grandmother        in 36s   Stroke Paternal Grandfather        in late 17s   Colon cancer Neg Hx    Social History   Socioeconomic History   Marital status: Divorced    Spouse name: Not on file   Number of children: Not on file   Years of education: Not on file   Highest education level: Not on file  Occupational History   Occupation: Airline pilot  Tobacco Use   Smoking status: Never   Smokeless tobacco: Never  Vaping Use   Vaping status: Never Used  Substance and Sexual Activity   Alcohol use: Yes    Comment: decreasing wine intake   Drug use: No   Sexual activity: Not on file  Other Topics Concern   Not on file  Social History Narrative   GETS REG EXERCISE         Social Determinants of Health   Financial Resource Strain: Low Risk  (01/04/2023)   Overall Financial Resource Strain (CARDIA)    Difficulty of Paying Living Expenses: Not hard at  all  Food Insecurity: No Food Insecurity (01/04/2023)   Hunger Vital Sign    Worried About Running Out of Food in the Last Year: Never true    Ran Out of Food in the Last Year: Never true  Transportation Needs: No Transportation Needs (01/04/2023)   PRAPARE - Administrator, Civil Service (Medical): No    Lack of Transportation (  Non-Medical): No  Physical Activity: Sufficiently Active (01/04/2023)   Exercise Vital Sign    Days of Exercise per Week: 7 days    Minutes of Exercise per Session: 30 min  Stress: No Stress Concern Present (01/04/2023)   [employer] of Occupational Health - Occupational Stress Questionnaire    Feeling of Stress : Not at all  Social Connections: Moderately Integrated (01/04/2023)   Social Connection and Isolation Panel [NHANES]    Frequency of Communication with Friends and Family: More than three times a week    Frequency of Social Gatherings with Friends and Family: Once a week    Attends Religious Services: More than 4 times per year    Active Member of [employer] or Organizations: Yes    Attends Banker Meetings: 1 to 4 times per year    Marital Status: Divorced    Tobacco Counseling Counseling given: Not Answered   Clinical Intake:  Pre-visit preparation completed: Yes  Pain : 0-10 Pain Score: 5  Pain Type: Chronic pain Pain Location: Shoulder Pain Orientation: Right     BMI - recorded: 28.98 Nutritional Risks: None Diabetes: No  How often do you need to have someone help you when you read instructions, pamphlets, or other written materials from your doctor or pharmacy?: 1 - Never What is the last grade level you completed in school?: HSG  Interpreter Needed?: No  Information entered by :: Susie Cassette, LPN.   Activities of Daily Living    01/04/2023   11:16 AM 01/03/2023   11:33 AM  In your present state of health, do you have any difficulty performing the following activities:  Hearing? 0 0  Vision? 0 0   Difficulty concentrating or making decisions? 0 0  Walking or climbing stairs? 0 0  Dressing or bathing? 0 0  Doing errands, shopping? 0 0  Preparing Food and eating ? N N  Using the Toilet? N N  In the past six months, have you accidently leaked urine? N N  Do you have problems with loss of bowel control? N N  Managing your Medications? N N  Managing your Finances? N N  Housekeeping or managing your Housekeeping? N N    Patient Care Team: Pincus Sanes, MD as PCP - General (Internal Medicine) Myra Rude, MD (Inactive) as Consulting Physician (Family Medicine) Milagros Evener, MD as Consulting Physician (Psychiatry) Kathyrn Sheriff, [facility] (Inactive) as Pharmacist (Pharmacist) [place], [facility] as Consulting Physician (Ophthalmology) [place], [facility] (Neurosurgery)  Indicate any recent Medical Services you may have received from other than [facility] providers in the past year (date may be approximate).     Assessment:   This is a routine wellness examination for Atalie.  Hearing/Vision screen Hearing Screening - Comments:: Patient denied any hearing difficulty.   No hearing aids.  Vision Screening - Comments:: Patient does not wear any corrective lenses/contacts.   Eye exam done by: [facility]    Goals Addressed             This Visit's Progress    My healthcare goal for 2024-2025 is to maintain my current health status by continuing to eat healthy, stay independent, physically and socially active.        Depression Screen    01/04/2023   11:13 AM 03/15/2022    3:16 PM 03/15/2022    3:15 PM 01/25/2022    1:09 PM 01/21/2021    4:42 PM 01/21/2020    3:13  PM 12/13/2018    1:30 PM  PHQ 2/9 Scores  PHQ - 2 Score 0 0 0 0 0 0 0  PHQ- 9 Score 0 0     0    Fall Risk    01/04/2023   11:16 AM 01/03/2023   11:33 AM 03/15/2022    3:15 PM 01/25/2022    1:06 PM 01/21/2021    4:44 PM  Fall Risk   Falls  in the past year? 0 0 0 0 1  Number falls in past yr: 0 0 0 0 0  Injury with Fall? 0 0 0 0 1  Risk for fall due to : No Fall Risks  No Fall Risks No Fall Risks   Follow up Falls prevention discussed  Falls evaluation completed Falls prevention discussed     MEDICARE RISK AT HOME: Medicare Risk at Home Any stairs in or around the home?: Yes If so, are there any without handrails?: Yes Home free of loose throw rugs in walkways, pet beds, electrical cords, etc?: Yes Adequate lighting in your home to reduce risk of falls?: Yes Life alert?: No Use of a cane, walker or w/c?: No Grab bars in the bathroom?: No Shower chair or bench in shower?: No Elevated toilet seat or a handicapped toilet?: No  TIMED UP AND GO:  Was the test performed?  No    Cognitive Function:    01/04/2023   11:17 AM  MMSE - Mini Mental State Exam  Not completed: Unable to complete        01/04/2023   11:17 AM 01/25/2022    1:18 PM  6CIT Screen  What Year? 0 points 0 points  What month? 0 points 0 points  What time? 0 points 0 points  Count back from 20 0 points 0 points  Months in reverse 0 points 0 points  Repeat phrase 0 points 0 points  Total Score 0 points 0 points    Immunizations Immunization History  Administered Date(s) Administered   Fluad Quad(high Dose 65+) 03/15/2022   Influenza Split 02/02/2011, 01/10/2012, 01/11/2020   Influenza Whole 12/22/2009   Influenza, High Dose Seasonal PF 01/22/2016, 01/24/2017, 01/25/2018, 01/25/2019, 01/08/2021   Influenza,inj,Quad PF,6+ Mos 01/17/2014   PFIZER(Purple Top)SARS-COV-2 Vaccination 05/13/2019, 06/05/2019, 01/11/2020   Pneumococcal Conjugate-13 03/08/2016   Pneumococcal Polysaccharide-23 02/20/2019   Td 11/11/2009   Tdap 07/02/2020   Zoster, Live 01/20/2015    TDAP status: Up to date  Flu Vaccine status: Due, Education has been provided regarding the importance of this vaccine. Advised may receive this vaccine at local pharmacy or Health  Dept. Aware to provide a copy of the vaccination record if obtained from local pharmacy or Health Dept. Verbalized acceptance and understanding.  Pneumococcal vaccine status: Up to date  Covid-19 vaccine status: Completed vaccines  Qualifies for Shingles Vaccine? Yes   Zostavax completed Yes   Shingrix Completed?: No.    Education has been provided regarding the importance of this vaccine. Patient has been advised to call insurance company to determine out of pocket expense if they have not yet received this vaccine. Advised may also receive vaccine at local pharmacy or Health Dept. Verbalized acceptance and understanding.  Screening Tests Health Maintenance  Topic Date Due   Zoster Vaccines- Shingrix (1 of 2) 02/12/1970   INFLUENZA VACCINE  10/28/2022   COVID-19 Vaccine (4 - 2023-24 season) 11/28/2022   DEXA SCAN  12/02/2022   MAMMOGRAM  04/01/2023   Colonoscopy  09/26/2023   Medicare Annual Wellness (  AWV)  01/04/2024   DTaP/Tdap/Td (3 - Td or Tdap) 07/03/2030   Pneumonia Vaccine 75+ Years old  Completed   Hepatitis C Screening  Completed   HPV VACCINES  Aged Out    Health Maintenance  Health Maintenance Due  Topic Date Due   Zoster Vaccines- Shingrix (1 of 2) 02/12/1970   INFLUENZA VACCINE  10/28/2022   COVID-19 Vaccine (4 - 2023-24 season) 11/28/2022   DEXA SCAN  12/02/2022    Colorectal cancer screening: Type of screening: Colonoscopy. Completed 09/25/2013. Repeat every 10 years  Mammogram status: Completed 03/31/2021. Repeat every year  Bone Density status: Completed 12/01/2017. Results reflect: Bone density results: NORMAL. Repeat every 5 years.  Lung Cancer Screening: (Low Dose CT Chest recommended if Age 85-80 years, 20 pack-year currently smoking OR have quit w/in 15years.) does not qualify.   Lung Cancer Screening Referral: no  Additional Screening:  Hepatitis C Screening: does qualify; Completed 02/28/2022  Vision Screening: Recommended annual ophthalmology  exams for early detection of glaucoma and other disorders of the eye. Is the patient up to date with their annual eye exam?  Yes  Who is the provider or what is the name of the office in which the patient attends annual eye exams? Manning Charity, OD . If pt is not established with a provider, would they like to be referred to a provider to establish care? No .   Dental Screening: Recommended annual dental exams for proper oral hygiene  Diabetic Foot Exam: N/A  Community Resource Referral / Chronic Care Management: CRR required this visit?  No   CCM required this visit?  No     Plan:     I have personally reviewed and noted the following in the patient's chart:   Medical and social history Use of alcohol, tobacco or illicit drugs  Current medications and supplements including opioid prescriptions. Patient is not currently taking opioid prescriptions. Functional ability and status Nutritional status Physical activity Advanced directives List of other physicians Hospitalizations, surgeries, and ER visits in previous 12 months Vitals Screenings to include cognitive, depression, and falls Referrals and appointments  In addition, I have reviewed and discussed with patient certain preventive protocols, quality metrics, and best practice recommendations. A written personalized care plan for preventive services as well as general preventive health recommendations were provided to patient.     Mickeal Needy, LPN   12/29/7251   After Visit Summary: (MyChart) Due to this being a telephonic visit, the after visit summary with patients personalized plan was offered to patient via MyChart   Nurse Notes: Normal cognitive status assessed by direct observation via telephone conversation by this Nurse Health Advisor. No abnormalities found.

## 2023-01-21 DIAGNOSIS — Z86006 Personal history of melanoma in-situ: Secondary | ICD-10-CM | POA: Diagnosis not present

## 2023-01-21 DIAGNOSIS — Z85828 Personal history of other malignant neoplasm of skin: Secondary | ICD-10-CM | POA: Diagnosis not present

## 2023-01-21 DIAGNOSIS — L578 Other skin changes due to chronic exposure to nonionizing radiation: Secondary | ICD-10-CM | POA: Diagnosis not present

## 2023-01-21 DIAGNOSIS — L570 Actinic keratosis: Secondary | ICD-10-CM | POA: Diagnosis not present

## 2023-01-21 DIAGNOSIS — D2221 Melanocytic nevi of right ear and external auricular canal: Secondary | ICD-10-CM | POA: Diagnosis not present

## 2023-01-21 DIAGNOSIS — L821 Other seborrheic keratosis: Secondary | ICD-10-CM | POA: Diagnosis not present

## 2023-01-21 DIAGNOSIS — L910 Hypertrophic scar: Secondary | ICD-10-CM | POA: Diagnosis not present

## 2023-01-21 DIAGNOSIS — L814 Other melanin hyperpigmentation: Secondary | ICD-10-CM | POA: Diagnosis not present

## 2023-02-28 ENCOUNTER — Ambulatory Visit
Admission: EM | Admit: 2023-02-28 | Discharge: 2023-02-28 | Disposition: A | Discharge status: Home / Self Care | Payer: PPO

## 2023-02-28 DIAGNOSIS — S50811A Abrasion of right forearm, initial encounter: Secondary | ICD-10-CM

## 2023-02-28 DIAGNOSIS — N959 Unspecified menopausal and perimenopausal disorder: Secondary | ICD-10-CM | POA: Insufficient documentation

## 2023-02-28 DIAGNOSIS — N760 Acute vaginitis: Secondary | ICD-10-CM | POA: Insufficient documentation

## 2023-02-28 DIAGNOSIS — W5501XA Bitten by cat, initial encounter: Secondary | ICD-10-CM

## 2023-02-28 DIAGNOSIS — B009 Herpesviral infection, unspecified: Secondary | ICD-10-CM | POA: Insufficient documentation

## 2023-02-28 DIAGNOSIS — B3731 Acute candidiasis of vulva and vagina: Secondary | ICD-10-CM | POA: Insufficient documentation

## 2023-02-28 MED ORDER — AMOXICILLIN-POT CLAVULANATE 875-125 MG PO TABS: 1.0000 | ORAL_TABLET | Freq: Two times a day (BID) | ORAL | 0 refills | Status: DC

## 2023-02-28 NOTE — ED Notes (Signed)
Patient declined Animal bite [facility]) form to be sent or completed. Domestic Administrator, arts.
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
## 2023-02-28 NOTE — ED Triage Notes (Signed)
Here for Animal Bite. Last Tdap: 07-02-2020. "My cat attacked my dog (never goes outside), she is upset often recently, I reached down to separate her encounter with my dog and she scratched me numerous times on both hands and lower arms with bites just above right wrist that is red/inflamed and hurts a lot". No fever "I just don't feel good at all". DOI: [id number] "during night".

## 2023-03-18 ENCOUNTER — Encounter: Payer: PPO | Admitting: Internal Medicine

## 2023-03-26 ENCOUNTER — Other Ambulatory Visit: Payer: Self-pay | Admitting: Internal Medicine

## 2023-04-07 DIAGNOSIS — H25813 Combined forms of age-related cataract, bilateral: Secondary | ICD-10-CM | POA: Diagnosis not present

## 2023-04-10 ENCOUNTER — Encounter: Payer: Self-pay | Admitting: Internal Medicine

## 2023-04-10 NOTE — Progress Notes (Unsigned)
 Subjective:    Patient ID: Jasmine Bray, female    DOB: 1950-09-15, 73 y.o.   MRN: [id number]      HPI Jasmine Bray is here for a Physical exam and her chronic medical problems.      Medications and allergies reviewed with patient and updated if appropriate.  Current Outpatient Medications on File Prior to Visit  Medication Sig Dispense Refill  . albuterol (VENTOLIN HFA) 108 (90 Base) MCG/ACT inhaler Inhale 2 puffs into the lungs every 4 (four) hours as needed for wheezing or shortness of breath.    . ALPRAZolam (XANAX) 1 MG tablet Take 1 mg by mouth 3 (three) times daily as needed for anxiety.    Marland Kitchen amLODipine (NORVASC) 5 MG tablet TAKE 1 TABLET (5 MG TOTAL) BY MOUTH DAILY. 90 tablet 1  . aspirin EC (ASPIRIN EC ADULT LOW DOSE) 81 MG tablet Take 81 mg by mouth daily.    Marland Kitchen aspirin EC 81 MG tablet Take 81 mg by mouth daily.    Marland Kitchen buPROPion (WELLBUTRIN XL) 150 MG 24 hr tablet Take 300 mg by mouth daily.    . Cholecalciferol (VITAMIN D3) 3000 UNITS TABS Take 1 capsule by mouth daily. Taking 1000 units daily    . escitalopram (LEXAPRO) 20 MG tablet Take 20 mg by mouth daily.    Marland Kitchen estradiol (ESTRACE) 0.1 MG/GM vaginal cream Place 2 g vaginally. 1 GRAM 3x A WEEK     . estradiol (EVAMIST) 1.53 MG/SPRAY transdermal spray Evamist 1.53 mg/spray (1.7 %) transdermal spray  APPLY 1 SPRAY DAILY AS DIRECTED    . famotidine (PEPCID) 20 MG tablet Take 1 tablet by mouth 2 (two) times daily.    Marland Kitchen gabapentin (NEURONTIN) 300 MG capsule Take 1 capsule (300 mg total) by mouth 3 (three) times daily. 180 capsule 3  . ibuprofen (ADVIL) 200 MG tablet Take 400 mg by mouth 2 (two) times daily as needed.    . Ibuprofen-diphenhydrAMINE HCl (ADVIL PM) 200-25 MG CAPS Take 1 tablet by mouth at bedtime as needed.    Marland Kitchen LORazepam (ATIVAN) 1 MG tablet Take 1 tablet by mouth daily.    . Multiple Vitamin (MULTIVITAMIN) tablet Take 1 tablet by mouth daily.    . Na Sulfate-K Sulfate-Mg Sulf 17.5-3.13-1.6 GM/177ML SOLN TAKE  1 KIT BY MOUTH ONCE.    Marland Kitchen Omega-3 Fatty Acids (FISH OIL OMEGA-3 PO) Take 1,000 mg by mouth daily.    . Omega-3 Fatty Acids (FISH OIL) 1000 MG CAPS Take by mouth daily.    . predniSONE (DELTASONE) 10 MG tablet Take 10 mg by mouth as directed.    . Probiotic Product (PROBIOTIC PEARLS PO) Take 1 tablet by mouth daily.    . simvastatin (ZOCOR) 40 MG tablet Take 1 tablet by mouth every day at bedtime 90 tablet 2  . SPORT SUNSCREEN SPF 30 EX apply topically to face and body daily for 30    . Turmeric 500 MG CAPS Take 1,000 mg by mouth daily.    . valACYclovir (VALTREX) 1000 MG tablet TAKE 1/2 TABLET BY MOUTH X 3-5 DAYS AS NEEDED 30 tablet 2   No current facility-administered medications on file prior to visit.    Review of Systems     Objective:  There were no vitals filed for this visit. There were no vitals filed for this visit. There is no height or weight on file to calculate BMI.  BP Readings from Last 3 Encounters:  06/13/23 132/84  05/09/23 126/80  04/27/23  120/78    Wt Readings from Last 3 Encounters:  06/13/23 194 lb (88 kg)  05/09/23 194 lb (88 kg)  04/27/23 185 lb (83.9 kg)       Physical Exam Constitutional: She appears well-developed and well-nourished. No distress.  HENT:  Head: Normocephalic and atraumatic.  Right Ear: External ear normal. Normal ear canal and TM Left Ear: External ear normal.  Normal ear canal and TM Mouth/Throat: Oropharynx is clear and moist.  Eyes: Conjunctivae normal.  Neck: Neck supple. No tracheal deviation present. No thyromegaly present.  No carotid bruit  Cardiovascular: Normal rate, regular rhythm and normal heart sounds.   No murmur heard.  No edema. Pulmonary/Chest: Effort normal and breath sounds normal. No respiratory distress. She has no wheezes. She has no rales.  Breast: deferred   Abdominal: Soft. She exhibits no distension. There is no tenderness.  Lymphadenopathy: She has no cervical adenopathy.  Skin: Skin is warm and  dry. She is not diaphoretic.  Psychiatric: She has a normal mood and affect. Her behavior is normal.     Lab Results  Component Value Date   WBC 7.8 03/15/2022   HGB 14.2 03/15/2022   HCT 41.6 03/15/2022   PLT 221.0 03/15/2022   GLUCOSE 93 05/09/2023   CHOL 200 05/09/2023   TRIG 142.0 05/09/2023   HDL 70.80 05/09/2023   LDLDIRECT 113.0 01/20/2015   LDLCALC 101 (H) 05/09/2023   ALT 21 05/09/2023   AST 22 05/09/2023   NA 137 05/09/2023   K 3.7 05/09/2023   CL 100 05/09/2023   CREATININE 0.69 05/09/2023   BUN 20 05/09/2023   CO2 25 05/09/2023   TSH 1.35 03/15/2022   HGBA1C 5.7 03/15/2022         Assessment & Plan:   Physical exam: Screening blood work  ordered Exercise   Weight   Substance abuse  none   Reviewed recommended immunizations.   Health Maintenance  Topic Date Due  . Zoster Vaccines- Shingrix (1 of 2) 02/13/2000  . COVID-19 Vaccine (4 - 2024-25 season) 11/28/2022  . DEXA SCAN  12/02/2022  . MAMMOGRAM  04/01/2023  . Colonoscopy  09/26/2023  . Medicare Annual Wellness (AWV)  01/04/2024  . DTaP/Tdap/Td (3 - Td or Tdap) 07/03/2030  . Pneumonia Vaccine 46+ Years old  Completed  . INFLUENZA VACCINE  Completed  . Hepatitis C Screening  Completed  . HPV VACCINES  Aged Out          See Problem List for Assessment and Plan of chronic medical problems.     This encounter was created in error - please disregard.

## 2023-04-10 NOTE — Patient Instructions (Addendum)

## 2023-04-11 ENCOUNTER — Encounter: Payer: PPO | Admitting: Internal Medicine

## 2023-04-11 DIAGNOSIS — Z0000 Encounter for general adult medical examination without abnormal findings: Secondary | ICD-10-CM

## 2023-04-11 DIAGNOSIS — B009 Herpesviral infection, unspecified: Secondary | ICD-10-CM

## 2023-04-11 DIAGNOSIS — R7303 Prediabetes: Secondary | ICD-10-CM

## 2023-04-11 DIAGNOSIS — E2839 Other primary ovarian failure: Secondary | ICD-10-CM

## 2023-04-11 DIAGNOSIS — E782 Mixed hyperlipidemia: Secondary | ICD-10-CM

## 2023-04-11 DIAGNOSIS — I10 Essential (primary) hypertension: Secondary | ICD-10-CM

## 2023-04-11 NOTE — Assessment & Plan Note (Signed)
Chronic Management per psychiatry 

## 2023-04-11 NOTE — Assessment & Plan Note (Signed)
 Chronic Intermittent Continue Valtrex as needed

## 2023-04-11 NOTE — Assessment & Plan Note (Signed)
 Chronic Lab Results  Component Value Date   HGBA1C 5.7 03/15/2022   Check a1c Low sugar / carb diet Stressed regular exercise

## 2023-04-11 NOTE — Assessment & Plan Note (Signed)
Chronic Regular exercise and healthy diet encouraged Check lipid panel, CMP, TSH Continue simvastatin 40 mg daily

## 2023-04-11 NOTE — Assessment & Plan Note (Signed)
 Chronic Blood pressure well controlled CMP, CBC Continue amlodipine 5 mg daily, hydrochlorothiazide 25 mg daily

## 2023-04-17 ENCOUNTER — Encounter: Payer: Self-pay | Admitting: Internal Medicine

## 2023-04-17 NOTE — Progress Notes (Signed)
 Subjective:    Patient ID: Jasmine Bray, female    DOB: [birth year], 73 y.o.   MRN: [id number]      HPI Leahann is here for a Physical exam and her chronic medical problems.      Medications and allergies reviewed with patient and updated if appropriate.  Current Outpatient Medications on File Prior to Visit  Medication Sig Dispense Refill  . albuterol (VENTOLIN HFA) 108 (90 Base) MCG/ACT inhaler Inhale 2 puffs into the lungs every 4 (four) hours as needed for wheezing or shortness of breath.    . ALPRAZolam (XANAX) 1 MG tablet Take 1 mg by mouth 3 (three) times daily as needed for anxiety.    Marland Kitchen amLODipine (NORVASC) 5 MG tablet TAKE 1 TABLET (5 MG TOTAL) BY MOUTH DAILY. 90 tablet 1  . aspirin EC (ASPIRIN EC ADULT LOW DOSE) 81 MG tablet Take 81 mg by mouth daily.    Marland Kitchen aspirin EC 81 MG tablet Take 81 mg by mouth daily.    Marland Kitchen buPROPion (WELLBUTRIN XL) 150 MG 24 hr tablet Take 300 mg by mouth daily.    . Cholecalciferol (VITAMIN D3) 3000 UNITS TABS Take 1 capsule by mouth daily. Taking 1000 units daily    . escitalopram (LEXAPRO) 20 MG tablet Take 20 mg by mouth daily.    Marland Kitchen estradiol (ESTRACE) 0.1 MG/GM vaginal cream Place 2 g vaginally. 1 GRAM 3x A WEEK     . estradiol (EVAMIST) 1.53 MG/SPRAY transdermal spray Evamist 1.53 mg/spray (1.7 %) transdermal spray  APPLY 1 SPRAY DAILY AS DIRECTED    . famotidine (PEPCID) 20 MG tablet Take 1 tablet by mouth 2 (two) times daily.    Marland Kitchen gabapentin (NEURONTIN) 300 MG capsule Take 1 capsule (300 mg total) by mouth 3 (three) times daily. 180 capsule 3  . ibuprofen (ADVIL) 200 MG tablet Take 400 mg by mouth 2 (two) times daily as needed.    . Ibuprofen-diphenhydrAMINE HCl (ADVIL PM) 200-25 MG CAPS Take 1 tablet by mouth at bedtime as needed.    Marland Kitchen LORazepam (ATIVAN) 1 MG tablet Take 1 tablet by mouth daily.    . Multiple Vitamin (MULTIVITAMIN) tablet Take 1 tablet by mouth daily.    . Na Sulfate-K Sulfate-Mg Sulf 17.5-3.13-1.6 GM/177ML SOLN TAKE  1 KIT BY MOUTH ONCE.    Marland Kitchen Omega-3 Fatty Acids (FISH OIL OMEGA-3 PO) Take 1,000 mg by mouth daily.    . Omega-3 Fatty Acids (FISH OIL) 1000 MG CAPS Take by mouth daily.    . predniSONE (DELTASONE) 10 MG tablet Take 10 mg by mouth as directed.    . Probiotic Product (PROBIOTIC PEARLS PO) Take 1 tablet by mouth daily.    . simvastatin (ZOCOR) 40 MG tablet Take 1 tablet by mouth every day at bedtime 90 tablet 2  . SPORT SUNSCREEN SPF 30 EX apply topically to face and body daily for 30    . Turmeric 500 MG CAPS Take 1,000 mg by mouth daily.    . valACYclovir (VALTREX) 1000 MG tablet TAKE 1/2 TABLET BY MOUTH X 3-5 DAYS AS NEEDED 30 tablet 2   No current facility-administered medications on file prior to visit.    Review of Systems     Objective:  There were no vitals filed for this visit. There were no vitals filed for this visit. There is no height or weight on file to calculate BMI.  BP Readings from Last 3 Encounters:  06/13/23 132/84  05/09/23 126/80  04/27/23  120/78    Wt Readings from Last 3 Encounters:  06/13/23 194 lb (88 kg)  05/09/23 194 lb (88 kg)  04/27/23 185 lb (83.9 kg)       Physical Exam Constitutional: She appears well-developed and well-nourished. No distress.  HENT:  Head: Normocephalic and atraumatic.  Right Ear: External ear normal. Normal ear canal and TM Left Ear: External ear normal.  Normal ear canal and TM Mouth/Throat: Oropharynx is clear and moist.  Eyes: Conjunctivae normal.  Neck: Neck supple. No tracheal deviation present. No thyromegaly present.  No carotid bruit  Cardiovascular: Normal rate, regular rhythm and normal heart sounds.   No murmur heard.  No edema. Pulmonary/Chest: Effort normal and breath sounds normal. No respiratory distress. She has no wheezes. She has no rales.  Breast: deferred   Abdominal: Soft. She exhibits no distension. There is no tenderness.  Lymphadenopathy: She has no cervical adenopathy.  Skin: Skin is warm and  dry. She is not diaphoretic.  Psychiatric: She has a normal mood and affect. Her behavior is normal.     Lab Results  Component Value Date   WBC 7.8 03/15/2022   HGB 14.2 03/15/2022   HCT 41.6 03/15/2022   PLT 221.0 03/15/2022   GLUCOSE 93 05/09/2023   CHOL 200 05/09/2023   TRIG 142.0 05/09/2023   HDL 70.80 05/09/2023   LDLDIRECT 113.0 01/20/2015   LDLCALC 101 (H) 05/09/2023   ALT 21 05/09/2023   AST 22 05/09/2023   NA 137 05/09/2023   K 3.7 05/09/2023   CL 100 05/09/2023   CREATININE 0.69 05/09/2023   BUN 20 05/09/2023   CO2 25 05/09/2023   TSH 1.35 03/15/2022   HGBA1C 5.7 03/15/2022         Assessment & Plan:   Physical exam: Screening blood work  ordered Exercise   Weight   Substance abuse  none   Reviewed recommended immunizations.   Health Maintenance  Topic Date Due  . Zoster Vaccines- Shingrix (1 of 2) 02/13/2000  . COVID-19 Vaccine (4 - 2024-25 season) 11/28/2022  . DEXA SCAN  12/02/2022  . MAMMOGRAM  04/01/2023  . Colonoscopy  09/26/2023  . Medicare Annual Wellness (AWV)  01/04/2024  . DTaP/Tdap/Td (3 - Td or Tdap) 07/03/2030  . Pneumonia Vaccine 39+ Years old  Completed  . INFLUENZA VACCINE  Completed  . Hepatitis C Screening  Completed  . HPV VACCINES  Aged Out          See Problem List for Assessment and Plan of chronic medical problems.     This encounter was created in error - please disregard.

## 2023-04-17 NOTE — Patient Instructions (Addendum)

## 2023-04-18 ENCOUNTER — Encounter: Payer: PPO | Admitting: Internal Medicine

## 2023-04-18 DIAGNOSIS — E782 Mixed hyperlipidemia: Secondary | ICD-10-CM

## 2023-04-18 DIAGNOSIS — B009 Herpesviral infection, unspecified: Secondary | ICD-10-CM

## 2023-04-18 DIAGNOSIS — R7303 Prediabetes: Secondary | ICD-10-CM

## 2023-04-18 DIAGNOSIS — I10 Essential (primary) hypertension: Secondary | ICD-10-CM

## 2023-04-18 NOTE — Assessment & Plan Note (Signed)
Chronic Lab Results  Component Value Date   HGBA1C 5.7 03/15/2022   Check a1c Low sugar / carb diet Stressed regular exercise

## 2023-04-18 NOTE — Assessment & Plan Note (Signed)
Chronic Regular exercise and healthy diet encouraged Check lipid panel, CMP, TSH Continue simvastatin 40 mg daily

## 2023-04-18 NOTE — Assessment & Plan Note (Signed)
Chronic Intermittent Continue Valtrex as needed

## 2023-04-18 NOTE — Assessment & Plan Note (Signed)
Chronic Blood pressure well controlled CMP, CBC Continue amlodipine 5 mg daily, hydrochlorothiazide 25 mg daily Will obtain an EKG today  EKG:

## 2023-04-27 ENCOUNTER — Ambulatory Visit: Payer: PPO | Admitting: Family Medicine

## 2023-04-27 ENCOUNTER — Other Ambulatory Visit: Payer: Self-pay

## 2023-04-27 ENCOUNTER — Encounter: Payer: Self-pay | Admitting: Family Medicine

## 2023-04-27 ENCOUNTER — Ambulatory Visit (HOSPITAL_BASED_OUTPATIENT_CLINIC_OR_DEPARTMENT_OTHER)
Admission: RE | Admit: 2023-04-27 | Discharge: 2023-04-27 | Disposition: A | Discharge status: Home / Self Care | Payer: PPO | Source: Ambulatory Visit | Attending: Family Medicine | Admitting: Family Medicine

## 2023-04-27 VITALS — BP 120/78 | Ht 67.0 in | Wt 185.0 lb

## 2023-04-27 DIAGNOSIS — M25562 Pain in left knee: Secondary | ICD-10-CM

## 2023-04-27 DIAGNOSIS — M25462 Effusion, left knee: Secondary | ICD-10-CM | POA: Diagnosis not present

## 2023-04-27 DIAGNOSIS — M1712 Unilateral primary osteoarthritis, left knee: Secondary | ICD-10-CM | POA: Diagnosis not present

## 2023-04-27 MED ORDER — TRIAMCINOLONE ACETONIDE 40 MG/ML IJ SUSP
40.0000 mg | Freq: Once | INTRAMUSCULAR | Status: AC
  Administered 2023-04-27: 40 mg via INTRA_ARTICULAR
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
## 2023-05-08 ENCOUNTER — Encounter: Payer: Self-pay | Admitting: Internal Medicine

## 2023-05-08 NOTE — Patient Instructions (Addendum)
 Call GI in April for your colonoscopy Phone: [phone]    Blood work was ordered.       Medications changes include :   None     Return in about 1 year (around 05/08/2024) for Physical Exam.    Health Maintenance, Female Adopting a healthy lifestyle and getting preventive care are important in promoting health and wellness. Ask your health care provider about: The right schedule for you to have regular tests and exams. Things you can do on your own to prevent diseases and keep yourself healthy. What should I know about diet, weight, and exercise? Eat a healthy diet  Eat a diet that includes plenty of vegetables, fruits, low-fat dairy products, and lean protein. Do not eat a lot of foods that are high in solid fats, added sugars, or sodium. Maintain a healthy weight Body mass index (BMI) is used to identify weight problems. It estimates body fat based on height and weight. Your health care provider can help determine your BMI and help you achieve or maintain a healthy weight. Get regular exercise Get regular exercise. This is one of the most important things you can do for your health. Most adults should: Exercise for at least 150 minutes each week. The exercise should increase your heart rate and make you sweat (moderate-intensity exercise). Do strengthening exercises at least twice a week. This is in addition to the moderate-intensity exercise. Spend less time sitting. Even light physical activity can be beneficial. Watch cholesterol and blood lipids Have your blood tested for lipids and cholesterol at 74 years of age, then have this test every 5 years. Have your cholesterol levels checked more often if: Your lipid or cholesterol levels are high. You are older than 74 years of age. You are at high risk for heart disease. What should I know about cancer screening? Depending on your health history and family history, you may need to have cancer screening at various  ages. This may include screening for: Breast cancer. Cervical cancer. Colorectal cancer. Skin cancer. Lung cancer. What should I know about heart disease, diabetes, and high blood pressure? Blood pressure and heart disease High blood pressure causes heart disease and increases the risk of stroke. This is more likely to develop in people who have high blood pressure readings or are overweight. Have your blood pressure checked: Every 3-5 years if you are 76-38 years of age. Every year if you are 67 years old or older. Diabetes Have regular diabetes screenings. This checks your fasting blood sugar level. Have the screening done: Once every three years after age 39 if you are at a normal weight and have a low risk for diabetes. More often and at a younger age if you are overweight or have a high risk for diabetes. What should I know about preventing infection? Hepatitis B If you have a higher risk for hepatitis B, you should be screened for this virus. Talk with your health care provider to find out if you are at risk for hepatitis B infection. Hepatitis C Testing is recommended for: Everyone born from [birth year] through 1966. Anyone with known risk factors for hepatitis C. Sexually transmitted infections (STIs) Get screened for STIs, including gonorrhea and chlamydia, if: You are sexually active and are younger than 74 years of age. You are older than 74 years of age and your health care provider tells you that you are at risk for this type of infection. Your sexual activity has changed since you were last  screened, and you are at increased risk for chlamydia or gonorrhea. Ask your health care provider if you are at risk. Ask your health care provider about whether you are at high risk for HIV. Your health care provider may recommend a prescription medicine to help prevent HIV infection. If you choose to take medicine to prevent HIV, you should first get tested for HIV. You should then be tested  every 3 months for as long as you are taking the medicine. Pregnancy If you are about to stop having your period (premenopausal) and you may become pregnant, seek counseling before you get pregnant. Take 400 to 800 micrograms (mcg) of folic acid every day if you become pregnant. Ask for birth control (contraception) if you want to prevent pregnancy. Osteoporosis and menopause Osteoporosis is a disease in which the bones lose minerals and strength with aging. This can result in bone fractures. If you are 43 years old or older, or if you are at risk for osteoporosis and fractures, ask your health care provider if you should: Be screened for bone loss. Take a calcium or vitamin D  supplement to lower your risk of fractures. Be given hormone replacement therapy (HRT) to treat symptoms of menopause. Follow these instructions at home: Alcohol use Do not drink alcohol if: Your health care provider tells you not to drink. You are pregnant, may be pregnant, or are planning to become pregnant. If you drink alcohol: Limit how much you have to: 0-1 drink a day. Know how much alcohol is in your drink. In the [place] one drink equals one 12 oz bottle of beer (355 mL), one 5 oz glass of wine (148 mL), or one 1 oz glass of hard liquor (44 mL). Lifestyle Do not use any products that contain nicotine or tobacco. These products include cigarettes, chewing tobacco, and vaping devices, such as e-cigarettes. If you need help quitting, ask your health care provider. Do not use street drugs. Do not share needles. Ask your health care provider for help if you need support or information about quitting drugs. General instructions Schedule regular health, dental, and eye exams. Stay current with your vaccines. Tell your health care provider if: You often feel depressed. You have ever been abused or do not feel safe at home. Summary Adopting a healthy lifestyle and getting preventive care are important in promoting  health and wellness. Follow your health care provider's instructions about healthy diet, exercising, and getting tested or screened for diseases. Follow your health care provider's instructions on monitoring your cholesterol and blood pressure. This information is not intended to replace advice given to you by your health care provider. Make sure you discuss any questions you have with your health care provider. Document Revised: 08/04/2020 Document Reviewed: 08/04/2020 Elsevier Patient Education  2024 [employer].
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
## 2023-05-09 ENCOUNTER — Ambulatory Visit (INDEPENDENT_AMBULATORY_CARE_PROVIDER_SITE_OTHER): Payer: PPO | Admitting: Internal Medicine

## 2023-05-09 VITALS — BP 126/80 | HR 64 | Temp 98.0°F | Ht 67.0 in | Wt 194.0 lb

## 2023-05-09 DIAGNOSIS — R7303 Prediabetes: Secondary | ICD-10-CM | POA: Diagnosis not present

## 2023-05-09 DIAGNOSIS — Z13820 Encounter for screening for osteoporosis: Secondary | ICD-10-CM

## 2023-05-09 DIAGNOSIS — B009 Herpesviral infection, unspecified: Secondary | ICD-10-CM | POA: Diagnosis not present

## 2023-05-09 DIAGNOSIS — E2839 Other primary ovarian failure: Secondary | ICD-10-CM

## 2023-05-09 DIAGNOSIS — Z0000 Encounter for general adult medical examination without abnormal findings: Secondary | ICD-10-CM | POA: Diagnosis not present

## 2023-05-09 DIAGNOSIS — I10 Essential (primary) hypertension: Secondary | ICD-10-CM | POA: Diagnosis not present

## 2023-05-09 DIAGNOSIS — E782 Mixed hyperlipidemia: Secondary | ICD-10-CM | POA: Diagnosis not present

## 2023-05-09 LAB — COMPREHENSIVE METABOLIC PANEL
ALT: 21 U/L (ref 0–35)
AST: 22 U/L (ref 0–37)
Albumin: 4.3 g/dL (ref 3.5–5.2)
Alkaline Phosphatase: 57 U/L (ref 39–117)
BUN: 20 mg/dL (ref 6–23)
CO2: 25 meq/L (ref 19–32)
Calcium: 9.5 mg/dL (ref 8.4–10.5)
Chloride: 100 meq/L (ref 96–112)
Creatinine, Ser: 0.69 mg/dL (ref 0.40–1.20)
GFR: 86.21 mL/min (ref >60.00)
Glucose, Bld: 93 mg/dL (ref 70–99)
Potassium: 3.7 meq/L (ref 3.5–5.1)
Sodium: 137 meq/L (ref 135–145)
Total Bilirubin: 0.5 mg/dL (ref 0.2–1.2)
Total Protein: 7.3 g/dL (ref 6.0–8.3)

## 2023-05-09 LAB — LIPID PANEL
Cholesterol: 200 mg/dL (ref 0–200)
HDL: 70.8 mg/dL (ref >39.00)
LDL Cholesterol: 101 mg/dL — ABNORMAL HIGH (ref 0–99)
NonHDL: 129.19
Total CHOL/HDL Ratio: 3
Triglycerides: 142 mg/dL (ref 0.0–149.0)
VLDL: 28.4 mg/dL (ref 0.0–40.0)

## 2023-05-09 NOTE — Assessment & Plan Note (Signed)
 Chronic Intermittent Continue Valtrex as needed

## 2023-05-09 NOTE — Assessment & Plan Note (Addendum)
 Chronic Blood pressure well controlled CMP, CBC Continue amlodipine 5 mg daily, hydrochlorothiazide 25 mg daily

## 2023-05-09 NOTE — Assessment & Plan Note (Signed)
Chronic Regular exercise and healthy diet encouraged Check lipid panel, CMP, TSH Continue simvastatin 40 mg daily

## 2023-05-09 NOTE — Assessment & Plan Note (Signed)
 Chronic Lab Results  Component Value Date   HGBA1C 5.7 03/15/2022   Check a1c Low sugar / carb diet Stressed regular exercise

## 2023-05-16 ENCOUNTER — Encounter: Payer: Self-pay | Admitting: Internal Medicine

## 2023-05-19 ENCOUNTER — Other Ambulatory Visit: Payer: Self-pay | Admitting: Family Medicine

## 2023-05-19 DIAGNOSIS — M1712 Unilateral primary osteoarthritis, left knee: Secondary | ICD-10-CM

## 2023-05-19 NOTE — Progress Notes (Signed)
Patient contacted today to check in, XR results reviewed. Options discussed for further management, PT referral placed, resources to access HEP also shared.   Pt shares that she may be interested in orthopedics referral for her shoulder to get surgery over the summer. Referred to Dr. Ave Filter at [facility] by Dr. Jordan Likes in 2024. Pt plans to reach out to clinic if repeat referral needed.   All questions answered. Return precautions discussed. Patient verbalized understanding and is in agreement with plan

## 2023-05-30 ENCOUNTER — Telehealth: Payer: Self-pay | Admitting: *Deleted

## 2023-05-30 NOTE — Telephone Encounter (Signed)
 Verified benefits for Orthovisc/Monovisc for left knee OA per pt's request.

## 2023-05-31 NOTE — Telephone Encounter (Signed)
 Left message for patient to call back to inform of VOB of below.

## 2023-06-01 DIAGNOSIS — Z780 Asymptomatic menopausal state: Secondary | ICD-10-CM | POA: Diagnosis not present

## 2023-06-01 DIAGNOSIS — B372 Candidiasis of skin and nail: Secondary | ICD-10-CM | POA: Diagnosis not present

## 2023-06-01 DIAGNOSIS — Z79890 Hormone replacement therapy (postmenopausal): Secondary | ICD-10-CM | POA: Diagnosis not present

## 2023-06-01 DIAGNOSIS — Z1231 Encounter for screening mammogram for malignant neoplasm of breast: Secondary | ICD-10-CM | POA: Diagnosis not present

## 2023-06-01 DIAGNOSIS — R5383 Other fatigue: Secondary | ICD-10-CM | POA: Diagnosis not present

## 2023-06-01 DIAGNOSIS — Z01411 Encounter for gynecological examination (general) (routine) with abnormal findings: Secondary | ICD-10-CM | POA: Diagnosis not present

## 2023-06-06 NOTE — Telephone Encounter (Signed)
Left message for patient to call back re: below.  

## 2023-06-07 ENCOUNTER — Ambulatory Visit: Payer: Self-pay | Admitting: Internal Medicine

## 2023-06-07 NOTE — Telephone Encounter (Signed)
 Copied from CRM [id number]. Topic: Clinical - Prescription Issue >> Jun 07, 2023 11:48 AM Florestine Avers wrote: Reason for CRM: Patient states that she thinks her prescriptions for hydrochlorothiazide (HYDRODIURIL) 25 MG tablet  was sent to the wrong pharmacy. Patient [facility] and [facility] and they both confirmed that they do not have the script. Patient is requesting that it be sent to the [facility] listed on her chart.

## 2023-06-07 NOTE — Telephone Encounter (Signed)
 Pt informed of below.  Orthovisc ordered with Arelia Longest, Print production planner.

## 2023-06-10 ENCOUNTER — Other Ambulatory Visit: Payer: Self-pay | Admitting: Internal Medicine

## 2023-06-10 NOTE — Telephone Encounter (Signed)
 Copied from CRM [id number]. Topic: Clinical - Medication Refill >> Jun 10, 2023 12:17 PM Theodis Sato wrote: Most Recent Primary Care Visit:  Provider: BURNS, Bobette Mo  Department: [facility]  Visit Type: PHYSICAL  Date: 05/09/2023  Medication: hydrochlorothiazide (HYDRODIURIL) 25 MG tablet  Has the patient contacted their pharmacy? Yes (Agent: If no, request that the patient contact the pharmacy for the refill. If patient does not wish to contact the pharmacy document the reason why and proceed with request.) (Agent: If yes, when and what did the pharmacy advise?)  Is this the correct pharmacy for this prescription? Yes If no, delete pharmacy and type the correct one.  This is the patient's preferred pharmacy:  [facility] [address] [place], [place] - [address] [place] [address] Phone: [phone] Fax: [phone]    Has the prescription been filled recently? Yes  Is the patient out of the medication? 3 left  Has the patient been seen for an appointment in the last year OR does the patient have an upcoming appointment? Yes  Can we respond through MyChart? Yes  Agent: Please be advised that Rx refills may take up to 3 business days. We ask that you follow-up with your pharmacy.

## 2023-06-10 NOTE — Telephone Encounter (Signed)
 Last Fill: 08/18/22  Last OV: 05/09/23 Next OV: 01/05/24 AWV  Routing to provider for review/authorization.

## 2023-06-13 ENCOUNTER — Other Ambulatory Visit: Payer: Self-pay

## 2023-06-13 ENCOUNTER — Ambulatory Visit: Admitting: Family Medicine

## 2023-06-13 ENCOUNTER — Encounter: Payer: Self-pay | Admitting: Family Medicine

## 2023-06-13 VITALS — BP 132/84 | Ht 67.0 in | Wt 194.0 lb

## 2023-06-13 DIAGNOSIS — M1712 Unilateral primary osteoarthritis, left knee: Secondary | ICD-10-CM

## 2023-06-13 MED ORDER — HYALURONAN 30 MG/2ML IX SOSY
30.0000 mg | PREFILLED_SYRINGE | Freq: Once | INTRA_ARTICULAR | Status: AC
  Administered 2023-06-13: 30 mg via INTRA_ARTICULAR

## 2023-06-13 MED ORDER — HYDROCHLOROTHIAZIDE 25 MG PO TABS: 25.0000 mg | ORAL_TABLET | Freq: Every day | ORAL | 1 refills | Status: DC

## 2023-06-13 NOTE — Progress Notes (Signed)
 PCP: Pincus Sanes, MD  Subjective:   HPI: Patient is a 73 y.o. female here for left knee orthovisc injection.  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     ALPRAZolam (XANAX) 1 MG tablet Take 1 mg by mouth 3 (three) times daily as needed for anxiety.     amLODipine (NORVASC) 5 MG tablet TAKE 1 TABLET (5 MG TOTAL) BY MOUTH DAILY. 90 tablet 1   aspirin EC (ASPIRIN EC ADULT LOW DOSE) 81 MG tablet Take 81 mg by mouth daily.     aspirin EC 81 MG tablet Take 81 mg by mouth daily.     buPROPion (WELLBUTRIN XL) 150 MG 24 hr tablet Take 300 mg by mouth daily.     Cholecalciferol (VITAMIN D3) 3000 UNITS TABS Take 1 capsule by mouth daily. Taking 1000 units daily     escitalopram (LEXAPRO) 20 MG tablet Take 20 mg by mouth daily.     estradiol (ESTRACE) 0.1 MG/GM vaginal cream Place 2 g vaginally. 1 GRAM 3x A WEEK      estradiol (EVAMIST) 1.53 MG/SPRAY transdermal spray Evamist 1.53 mg/spray (1.7 %) transdermal spray  APPLY 1 SPRAY DAILY AS DIRECTED     famotidine (PEPCID) 20 MG tablet Take 1 tablet by mouth 2 (two) times daily.     gabapentin (NEURONTIN) 300 MG capsule  Take 1 capsule (300 mg total) by mouth 3 (three) times daily. 180 capsule 3   hydrochlorothiazide (HYDRODIURIL) 25 MG tablet Take 1 tablet (25 mg total) by mouth daily. 90 tablet 1   ibuprofen (ADVIL) 200 MG tablet Take 400 mg by mouth 2 (two) times daily as needed.     Ibuprofen-diphenhydrAMINE HCl (ADVIL PM) 200-25 MG CAPS Take 1 tablet by mouth at bedtime as needed.     LORazepam (ATIVAN) 1 MG tablet Take 1 tablet by mouth daily.     Multiple Vitamin (MULTIVITAMIN) tablet Take 1 tablet by mouth daily.     Na Sulfate-K Sulfate-Mg Sulf 17.5-3.13-1.6 GM/177ML SOLN TAKE 1 KIT BY MOUTH ONCE.     Omega-3 Fatty Acids (FISH OIL OMEGA-3 PO) Take 1,000 mg by mouth daily.     Omega-3 Fatty Acids (FISH OIL) 1000 MG CAPS Take by mouth daily.     predniSONE (DELTASONE) 10 MG tablet Take 10 mg by mouth as directed.     Probiotic Product (PROBIOTIC PEARLS PO) Take 1 tablet by mouth daily.     simvastatin (ZOCOR) 40 MG tablet Take 1 tablet by mouth every  day at bedtime 90 tablet 2   SPORT SUNSCREEN SPF 30 EX apply topically to face and body daily for 30     Turmeric 500 MG CAPS Take 1,000 mg by mouth daily.     valACYclovir (VALTREX) 1000 MG tablet TAKE 1/2 TABLET BY MOUTH X 3-5 DAYS AS NEEDED 30 tablet 2   No current facility-administered medications on file prior to visit.    Past Surgical History:  Procedure Laterality Date   ABDOMINAL HYSTERECTOMY     & USO   ANAL FISTULA ECTOMY      X 2, Dr Earlene Plater   COLONOSCOPY  2004, 2015   [facility]   INJECTION KNEE Bilateral    [employer]   MYOMECTOMY     FOR FIBROIDS, Dr Nicholas Lose   steroid injection shoulder Right 05/11/2013   contusion/strain; Dr Rennis Chris    No Known Allergies  BP 132/84   Ht 5\' 7"  (1.702 m)   Wt 194 lb (88 kg)   BMI 30.38 kg/m       No data to display              No data to display              Objective:  Physical Exam:  Gen: NAD, comfortable in exam room  Knee exam not repeated today.   Assessment & Plan:  1.  Left knee arthritis - first orthovisc injection given today.  Follow up in 1 week.  After informed written consent timeout was performed, patient was lying supine on exam table. Left knee was prepped with alcohol swab and utilizing superolateral approach with ultrasound guidance, patient's left knee was injected intraarticularly with 3:1 lidocaine: depomedrol. Patient tolerated the procedure well without immediate complications.

## 2023-06-20 ENCOUNTER — Ambulatory Visit: Admitting: Family Medicine

## 2023-06-21 ENCOUNTER — Other Ambulatory Visit: Payer: Self-pay

## 2023-06-21 ENCOUNTER — Ambulatory Visit: Admitting: Family Medicine

## 2023-06-21 VITALS — BP 126/70 | Ht 67.0 in | Wt 194.0 lb

## 2023-06-21 DIAGNOSIS — M1712 Unilateral primary osteoarthritis, left knee: Secondary | ICD-10-CM | POA: Diagnosis not present

## 2023-06-21 MED ORDER — HYALURONAN 30 MG/2ML IX SOSY
30.0000 mg | PREFILLED_SYRINGE | Freq: Once | INTRA_ARTICULAR | Status: AC
  Administered 2023-06-21: 30 mg via INTRA_ARTICULAR

## 2023-06-22 NOTE — Progress Notes (Signed)
 PCP: Pincus Sanes, MD  Subjective:   HPI: Patient is a 73 y.o. female here for orthovisc injection.  Patient reports some improvement with first injection. No complaints.  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     ALPRAZolam (XANAX) 1 MG tablet Take 1 mg by mouth 3 (three) times daily as needed for anxiety.     amLODipine (NORVASC) 5 MG tablet TAKE 1 TABLET (5 MG TOTAL) BY MOUTH DAILY. 90 tablet 1   aspirin EC (ASPIRIN EC ADULT LOW DOSE) 81 MG tablet Take 81 mg by mouth daily.     aspirin EC 81 MG tablet Take 81 mg by mouth daily.     buPROPion (WELLBUTRIN XL) 150 MG 24 hr tablet Take 300 mg by mouth daily.     Cholecalciferol (VITAMIN D3) 3000 UNITS TABS Take 1 capsule by mouth daily. Taking 1000 units daily     escitalopram (LEXAPRO) 20 MG tablet Take 20 mg by mouth daily.     estradiol (ESTRACE) 0.1 MG/GM vaginal cream Place 2 g vaginally. 1 GRAM 3x A WEEK      estradiol (EVAMIST) 1.53 MG/SPRAY transdermal spray Evamist 1.53 mg/spray (1.7 %) transdermal spray  APPLY 1 SPRAY DAILY AS DIRECTED     famotidine (PEPCID) 20 MG tablet Take 1 tablet by mouth 2 (two) times daily.     gabapentin (NEURONTIN) 300 MG capsule Take 1 capsule (300 mg  total) by mouth 3 (three) times daily. 180 capsule 3   hydrochlorothiazide (HYDRODIURIL) 25 MG tablet Take 1 tablet (25 mg total) by mouth daily. 90 tablet 1   ibuprofen (ADVIL) 200 MG tablet Take 400 mg by mouth 2 (two) times daily as needed.     Ibuprofen-diphenhydrAMINE HCl (ADVIL PM) 200-25 MG CAPS Take 1 tablet by mouth at bedtime as needed.     LORazepam (ATIVAN) 1 MG tablet Take 1 tablet by mouth daily.     Multiple Vitamin (MULTIVITAMIN) tablet Take 1 tablet by mouth daily.     Na Sulfate-K Sulfate-Mg Sulf 17.5-3.13-1.6 GM/177ML SOLN TAKE 1 KIT BY MOUTH ONCE.     Omega-3 Fatty Acids (FISH OIL OMEGA-3 PO) Take 1,000 mg by mouth daily.     Omega-3 Fatty Acids (FISH OIL) 1000 MG CAPS Take by mouth daily.     predniSONE (DELTASONE) 10 MG tablet Take 10 mg by mouth as directed.     Probiotic Product (PROBIOTIC PEARLS PO) Take 1 tablet by mouth daily.     simvastatin (ZOCOR) 40 MG tablet Take 1 tablet by mouth every day at bedtime 90 tablet  2   SPORT SUNSCREEN SPF 30 EX apply topically to face and body daily for 30     Turmeric 500 MG CAPS Take 1,000 mg by mouth daily.     valACYclovir (VALTREX) 1000 MG tablet TAKE 1/2 TABLET BY MOUTH X 3-5 DAYS AS NEEDED 30 tablet 2   No current facility-administered medications on file prior to visit.    Past Surgical History:  Procedure Laterality Date   ABDOMINAL HYSTERECTOMY     & USO   ANAL FISTULA ECTOMY      X 2, Dr Earlene Plater   COLONOSCOPY  2004, 2015   [facility]   INJECTION KNEE Bilateral    [employer]   MYOMECTOMY     FOR FIBROIDS, Dr Nicholas Lose   steroid injection shoulder Right 05/11/2013   contusion/strain; Dr Rennis Chris    No Known Allergies  BP 126/70   Ht 5\' 7"  (1.702 m)   Wt 194 lb (88 kg)   BMI 30.38 kg/m       No data to display              No data to display              Objective:  Physical Exam:  Gen: NAD, comfortable in exam room    Assessment & Plan:  1. Left knee arthritis - second orthovisc injection given.   Follow up in 1 week for third injection.  After informed written consent timeout was performed, patient was lying supine on exam table. Left knee was prepped with alcohol swab and utilizing superolateral approach, patient's left knee was injected intraarticularly with 3mL lidocaine followed by orthovisc. Patient tolerated the procedure well without immediate complications.

## 2023-06-23 ENCOUNTER — Ambulatory Visit: Admitting: Family Medicine

## 2023-06-27 ENCOUNTER — Ambulatory Visit: Admitting: Family Medicine

## 2023-06-27 ENCOUNTER — Other Ambulatory Visit: Payer: Self-pay

## 2023-06-27 ENCOUNTER — Encounter: Payer: Self-pay | Admitting: Family Medicine

## 2023-06-27 VITALS — BP 132/74 | Ht 67.0 in | Wt 194.0 lb

## 2023-06-27 DIAGNOSIS — M1712 Unilateral primary osteoarthritis, left knee: Secondary | ICD-10-CM

## 2023-06-27 MED ORDER — HYALURONAN 30 MG/2ML IX SOSY
30.0000 mg | PREFILLED_SYRINGE | Freq: Once | INTRA_ARTICULAR | Status: AC
  Administered 2023-06-27: 30 mg via INTRA_ARTICULAR

## 2023-06-27 NOTE — Progress Notes (Signed)
 PCP: Pincus Sanes, MD  Subjective:   HPI: Jasmine is a 73 y.o. Bray here for orthovisc injection.  3/25: Jasmine reports some improvement with first injection. No complaints.  3/31: Jasmine reports she's doing well. 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     ALPRAZolam (XANAX) 1 MG tablet Take 1 mg by mouth 3 (three) times daily as needed for anxiety.     amLODipine (NORVASC) 5 MG tablet TAKE 1 TABLET (5 MG TOTAL) BY MOUTH DAILY. 90 tablet 1   aspirin EC (ASPIRIN EC ADULT LOW DOSE) 81 MG tablet Take 81 mg by mouth daily.     aspirin EC 81 MG tablet Take 81 mg by mouth daily.     buPROPion (WELLBUTRIN XL) 150 MG 24 hr tablet Take 300 mg by mouth daily.     Cholecalciferol (VITAMIN D3) 3000 UNITS TABS Take 1 capsule by mouth daily. Taking 1000 units daily     escitalopram (LEXAPRO) 20 MG tablet Take 20 mg by mouth daily.     estradiol (ESTRACE) 0.1 MG/GM vaginal cream Place 2 g vaginally. 1 GRAM 3x A WEEK      estradiol (EVAMIST) 1.53 MG/SPRAY transdermal spray Evamist 1.53 mg/spray (1.7 %) transdermal spray  APPLY 1 SPRAY DAILY AS DIRECTED     famotidine (PEPCID) 20 MG tablet Take 1 tablet by mouth 2 (two)  times daily.     gabapentin (NEURONTIN) 300 MG capsule Take 1 capsule (300 mg total) by mouth 3 (three) times daily. 180 capsule 3   hydrochlorothiazide (HYDRODIURIL) 25 MG tablet Take 1 tablet (25 mg total) by mouth daily. 90 tablet 1   ibuprofen (ADVIL) 200 MG tablet Take 400 mg by mouth 2 (two) times daily as needed.     Ibuprofen-diphenhydrAMINE HCl (ADVIL PM) 200-25 MG CAPS Take 1 tablet by mouth at bedtime as needed.     LORazepam (ATIVAN) 1 MG tablet Take 1 tablet by mouth daily.     Multiple Vitamin (MULTIVITAMIN) tablet Take 1 tablet by mouth daily.     Na Sulfate-K Sulfate-Mg Sulf 17.5-3.13-1.6 GM/177ML SOLN TAKE 1 KIT BY MOUTH ONCE.     Omega-3 Fatty Acids (FISH OIL OMEGA-3 PO) Take 1,000 mg by mouth daily.     Omega-3 Fatty Acids (FISH OIL) 1000 MG CAPS Take by mouth daily.     predniSONE (DELTASONE) 10 MG tablet Take 10 mg by mouth as directed.     Probiotic Product (PROBIOTIC PEARLS PO) Take 1 tablet by mouth daily.     simvastatin (ZOCOR) 40 MG  tablet Take 1 tablet by mouth every day at bedtime 90 tablet 2   SPORT SUNSCREEN SPF 30 EX apply topically to face and body daily for 30     Turmeric 500 MG CAPS Take 1,000 mg by mouth daily.     valACYclovir (VALTREX) 1000 MG tablet TAKE 1/2 TABLET BY MOUTH X 3-5 DAYS AS NEEDED 30 tablet 2   No current facility-administered medications on file prior to visit.    Past Surgical History:  Procedure Laterality Date   ABDOMINAL HYSTERECTOMY     & USO   ANAL FISTULA ECTOMY      X 2, Dr Earlene Plater   COLONOSCOPY  2004, 2015   [facility]   INJECTION KNEE Bilateral    [employer]   MYOMECTOMY     FOR FIBROIDS, Dr Nicholas Lose   steroid injection shoulder Right 05/11/2013   contusion/strain; Dr Rennis Chris    No Known Allergies  BP 132/74   Ht 5\' 7"  (1.702 m)   Wt 194 lb (88 kg)   BMI 30.38 kg/m       No data to display              No data to display              Objective:  Physical Exam:  Gen: NAD, comfortable in exam room  Left  knee exam not repeated today.    Assessment & Plan:  1. Left knee arthritis - third orthovisc injection given.  Follow up as needed.  After informed written consent timeout was performed, Jasmine was lying supine on exam table. Left knee was prepped with alcohol swab and utilizing superolateral approach with ultrasound guidance, Jasmine's left knee was injected intraarticularly with 3mL lidocaine followed by orthovisc. Jasmine tolerated the procedure well without immediate complications.

## 2023-06-30 ENCOUNTER — Inpatient Hospital Stay: Admission: RE | Admit: 2023-06-30 | Source: Ambulatory Visit

## 2023-07-08 ENCOUNTER — Ambulatory Visit (INDEPENDENT_AMBULATORY_CARE_PROVIDER_SITE_OTHER)
Admission: RE | Admit: 2023-07-08 | Discharge: 2023-07-08 | Disposition: A | Discharge status: Home / Self Care | Source: Ambulatory Visit | Attending: Internal Medicine | Admitting: Internal Medicine

## 2023-07-08 ENCOUNTER — Encounter: Payer: Self-pay | Admitting: Internal Medicine

## 2023-07-08 DIAGNOSIS — Z13820 Encounter for screening for osteoporosis: Secondary | ICD-10-CM

## 2023-07-08 DIAGNOSIS — E2839 Other primary ovarian failure: Secondary | ICD-10-CM | POA: Diagnosis not present

## 2023-07-22 DIAGNOSIS — L578 Other skin changes due to chronic exposure to nonionizing radiation: Secondary | ICD-10-CM | POA: Diagnosis not present

## 2023-07-22 DIAGNOSIS — L814 Other melanin hyperpigmentation: Secondary | ICD-10-CM | POA: Diagnosis not present

## 2023-07-22 DIAGNOSIS — D2221 Melanocytic nevi of right ear and external auricular canal: Secondary | ICD-10-CM | POA: Diagnosis not present

## 2023-07-22 DIAGNOSIS — Z85828 Personal history of other malignant neoplasm of skin: Secondary | ICD-10-CM | POA: Diagnosis not present

## 2023-07-22 DIAGNOSIS — L570 Actinic keratosis: Secondary | ICD-10-CM | POA: Diagnosis not present

## 2023-07-22 DIAGNOSIS — L821 Other seborrheic keratosis: Secondary | ICD-10-CM | POA: Diagnosis not present

## 2023-07-22 DIAGNOSIS — Z86006 Personal history of melanoma in-situ: Secondary | ICD-10-CM | POA: Diagnosis not present

## 2023-07-22 DIAGNOSIS — D485 Neoplasm of uncertain behavior of skin: Secondary | ICD-10-CM | POA: Diagnosis not present

## 2023-07-22 DIAGNOSIS — D2371 Other benign neoplasm of skin of right lower limb, including hip: Secondary | ICD-10-CM | POA: Diagnosis not present

## 2023-08-03 ENCOUNTER — Ambulatory Visit: Admitting: Family Medicine

## 2023-08-05 ENCOUNTER — Other Ambulatory Visit: Payer: Self-pay | Admitting: Internal Medicine

## 2023-08-10 ENCOUNTER — Ambulatory Visit: Admitting: Family Medicine

## 2023-08-17 ENCOUNTER — Other Ambulatory Visit: Payer: Self-pay

## 2023-08-17 ENCOUNTER — Ambulatory Visit: Admitting: Family Medicine

## 2023-08-17 VITALS — BP 122/76 | Ht 67.0 in | Wt 190.0 lb

## 2023-08-17 DIAGNOSIS — M1712 Unilateral primary osteoarthritis, left knee: Secondary | ICD-10-CM | POA: Diagnosis not present

## 2023-08-17 MED ORDER — HYALURONAN 30 MG/2ML IX SOSY
30.0000 mg | PREFILLED_SYRINGE | Freq: Once | INTRA_ARTICULAR | Status: AC
  Administered 2023-08-17: 30 mg via INTRA_ARTICULAR

## 2023-08-17 NOTE — Progress Notes (Signed)
 PCP: Colene Dauphin, MD  Subjective:   CC: LT knee pain  HPI: Jasmine Bray is a 73 y.o. female here for f/u Orthovisc injection.  Jasmine Bray reports she is doing well with pain off and on and some improvement with last injection. Here for 4th injection in last 6 months.  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     ALPRAZolam (XANAX) 1 MG tablet Take 1 mg by mouth 3 (three) times daily as needed for anxiety.     amLODipine  (NORVASC ) 5 MG tablet TAKE 1 TABLET (5 MG TOTAL) BY MOUTH DAILY. 90 tablet 1   aspirin EC (ASPIRIN EC ADULT LOW DOSE) 81 MG tablet Take 81 mg by mouth daily.     aspirin EC 81 MG tablet Take 81 mg by mouth daily.     buPROPion (WELLBUTRIN XL) 150 MG 24 hr tablet Take 300 mg by mouth daily.     Cholecalciferol (VITAMIN D3) 3000 UNITS TABS Take 1 capsule by mouth daily. Taking 1000 units daily     escitalopram (LEXAPRO) 20 MG tablet Take 20 mg by mouth daily.     estradiol (ESTRACE) 0.1 MG/GM vaginal cream Place 2 g vaginally. 1 GRAM 3x A WEEK      estradiol (EVAMIST) 1.53 MG/SPRAY transdermal spray Evamist 1.53 mg/spray (1.7 %) transdermal spray  APPLY 1 SPRAY DAILY AS DIRECTED     famotidine (PEPCID) 20 MG tablet Take 1 tablet by  mouth 2 (two) times daily.     gabapentin  (NEURONTIN ) 300 MG capsule Take 1 capsule (300 mg total) by mouth 3 (three) times daily. 180 capsule 3   hydrochlorothiazide  (HYDRODIURIL ) 25 MG tablet Take 1 tablet (25 mg total) by mouth daily. 90 tablet 1   ibuprofen (ADVIL) 200 MG tablet Take 400 mg by mouth 2 (two) times daily as needed.     Ibuprofen-diphenhydrAMINE HCl (ADVIL PM) 200-25 MG CAPS Take 1 tablet by mouth at bedtime as needed.     LORazepam (ATIVAN) 1 MG tablet Take 1 tablet by mouth daily.     Multiple Vitamin (MULTIVITAMIN) tablet Take 1 tablet by mouth daily.     Na Sulfate-K Sulfate-Mg Sulf 17.5-3.13-1.6 GM/177ML SOLN TAKE 1 KIT BY MOUTH ONCE.     Omega-3 Fatty Acids (FISH OIL OMEGA-3 PO) Take 1,000 mg by mouth daily.     Omega-3 Fatty Acids (FISH OIL) 1000 MG CAPS Take by mouth daily.     predniSONE  (DELTASONE ) 10 MG tablet Take 10 mg by mouth as directed.     Probiotic Product (PROBIOTIC PEARLS PO) Take 1 tablet by  mouth daily.     simvastatin  (ZOCOR ) 40 MG tablet TAKE 1 TABLET BY MOUTH ONCE DAILY AT BEDTIME 90 tablet 0   SPORT SUNSCREEN SPF 30 EX apply topically to face and body daily for 30     Turmeric 500 MG CAPS Take 1,000 mg by mouth daily.     valACYclovir (VALTREX) 1000 MG tablet TAKE 1/2 TABLET BY MOUTH X 3-5 DAYS AS NEEDED 30 tablet 2   No current facility-administered medications on file prior to visit.    Past Surgical History:  Procedure Laterality Date   ABDOMINAL HYSTERECTOMY     & USO   ANAL FISTULA ECTOMY      X 2, Dr Nolon Baxter   COLONOSCOPY  2004, 2015   [facility]   INJECTION KNEE Bilateral    [employer]   MYOMECTOMY     FOR FIBROIDS, Dr Ola Berger   steroid injection shoulder Right 05/11/2013   contusion/strain; Dr Alfredo Ano    No Known Allergies  BP 122/76   Ht 5\' 7"  (1.702 m)   Wt 190 lb (86.2 kg)   BMI 29.76 kg/m       No data to display              No data to display              Objective:  Physical Exam:  Gen: NAD, comfortable in  exam room  LT knee exam not repeated today.   Assessment & Plan:   1. LT knee arthritis -- fourth Orthovisc injection given today and Jasmine Bray tolerated the procedure well. Follow up as needed  After informed written consent timeout was performed, Jasmine Bray was lying supine on exam table. Left knee was prepped with alcohol swab and utilizing superolateral approach with ultrasound guidance, Jasmine Bray's left knee was injected intraarticularly with 3mL lidocaine  followed by orthovisc. Images saved. Jasmine Bray tolerated the procedure well without immediate complications.   Unknown Garbe, MS4 [employer]

## 2023-09-30 ENCOUNTER — Other Ambulatory Visit: Payer: Self-pay | Admitting: Internal Medicine

## 2023-10-18 ENCOUNTER — Encounter: Payer: Self-pay | Admitting: Emergency Medicine

## 2023-10-18 ENCOUNTER — Ambulatory Visit: Admission: EM | Admit: 2023-10-18 | Discharge: 2023-10-18 | Disposition: A | Discharge status: Home / Self Care

## 2023-10-18 DIAGNOSIS — M7989 Other specified soft tissue disorders: Secondary | ICD-10-CM

## 2023-10-18 DIAGNOSIS — T63461A Toxic effect of venom of wasps, accidental (unintentional), initial encounter: Secondary | ICD-10-CM

## 2023-10-18 DIAGNOSIS — L299 Pruritus, unspecified: Secondary | ICD-10-CM | POA: Diagnosis not present

## 2023-10-18 MED ORDER — TRIAMCINOLONE ACETONIDE 40 MG/ML IJ SUSP
40.0000 mg | Freq: Once | INTRAMUSCULAR | Status: AC
  Administered 2023-10-18: 40 mg via INTRAMUSCULAR

## 2023-10-18 NOTE — ED Triage Notes (Signed)
 Pt presents c/o swelling in right hand due to being stung by a yellow jacket. Pt was also stung on left upper thigh. The swelling the thigh has reduced but pt says it still itches severely.

## 2023-10-18 NOTE — ED Provider Notes (Signed)
 [facility]    CSN: [id number] Arrival date & time: 10/18/23  1201      History   Chief Complaint Chief Complaint  Patient presents with   Insect Bite    HPI Jasmine Bray is a 73 y.o. female.  Presents with multiple wasp stings that occurred yesterday She has 3 on her right hand and wrist, 1 on the left outer thigh Yesterday the thigh was very swollen but has improved today Today the right hand and wrist are swollen, painful, itching.  She took a Benadryl last night  Denies swelling of lips, tongue, throat.  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   Riding accident; horse had to be put down 2011  Dr Vincente, Psychiatry        Sciatica of left side 10/10/2017    Patient Active Problem List   Diagnosis Date Noted   Vaginitis 02/28/2023   Menopausal problem 02/28/2023   Herpes simplex 02/28/2023   Candidiasis of vagina 02/28/2023   Anxiety 03/14/2022   Rotator cuff tear, right 03/03/2021   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   Depression 09/06/2019   History of skin cancer 02/20/2019   Osteoarthritis 02/20/2019   Primary osteoarthritis of right knee 07/18/2018   Chronic knee pain 07/25/2017   Hepatitis C antibody test positive - cleared 01/29/2016   Acute pain of right shoulder 04/24/2013   Prediabetes 01/10/2012   Hyperlipidemia 11/11/2009   Essential hypertension 11/11/2009    Past Surgical History:  Procedure Laterality Date    ABDOMINAL HYSTERECTOMY     & USO   ANAL FISTULA ECTOMY      X 2, Dr Nicholaus   COLONOSCOPY  2004, 2015   [facility]   INJECTION KNEE Bilateral    [facility]   MYOMECTOMY     FOR FIBROIDS, Dr Lomax   steroid injection shoulder Right 05/11/2013   contusion/strain; Dr Melita    OB History   No obstetric history on file.      Home Medications    Prior to Admission medications   Medication Sig Start Date End Date Taking? Authorizing Provider  nystatin cream (MYCOSTATIN) APPLY TO AFFECTED AREA BY TOPICAL ROUTE 2 TIMES PER DAY AS NEEDED 06/01/23  Yes [provider]  [facility] injection  07/08/23  Yes [provider]  Vilazodone HCl (VIIBRYD) 40 MG TABS  08/24/23  Yes [provider]  Vilazodone HCl 20 MG TABS  08/24/23  Yes [provider]  albuterol  (VENTOLIN  HFA) 108 (90 Base) MCG/ACT inhaler Inhale 2 puffs into the lungs every 4 (four) hours as needed for wheezing or shortness of breath.    [provider]  ALPRAZolam (XANAX) 1 MG tablet Take 1 mg by mouth 3 (three) times daily as needed for anxiety. 03/05/21   [provider]  amLODipine  (NORVASC ) 5 MG tablet TAKE 1 TABLET (5  MG TOTAL) BY MOUTH DAILY. 10/03/23   Geofm Glade PARAS, MD  aspirin EC (ASPIRIN EC ADULT LOW DOSE) 81 MG tablet Take 81 mg by mouth daily.    [provider]  aspirin EC 81 MG tablet Take 81 mg by mouth daily.    [provider]  buPROPion (WELLBUTRIN XL) 150 MG 24 hr tablet Take 300 mg by mouth daily.    [provider]  Cholecalciferol (VITAMIN D3) 3000 UNITS TABS Take 1 capsule by mouth daily. Taking 1000 units daily    [provider]  escitalopram (LEXAPRO) 20 MG tablet Take 20 mg by mouth daily.    [provider]  estradiol (ESTRACE) 0.1 MG/GM vaginal cream Place 2 g vaginally. 1 GRAM 3x A WEEK     [provider]  estradiol (EVAMIST) 1.53 MG/SPRAY transdermal spray Evamist 1.53 mg/spray (1.7 %) transdermal spray   APPLY 1 SPRAY DAILY AS DIRECTED    [provider]  famotidine (PEPCID) 20 MG tablet Take 1 tablet by mouth 2 (two) times daily.    [provider]  gabapentin  (NEURONTIN ) 300 MG capsule Take 1 capsule (300 mg total) by mouth 3 (three) times daily. 03/03/21   Chick Venetia BRAVO, MD  hydrochlorothiazide  (HYDRODIURIL ) 25 MG tablet Take 1 tablet (25 mg total) by mouth daily. 06/13/23   Geofm Glade PARAS, MD  ibuprofen (ADVIL) 200 MG tablet Take 400 mg by mouth 2 (two) times daily as needed.    [provider]  Ibuprofen-diphenhydrAMINE HCl (ADVIL PM) 200-25 MG CAPS Take 1 tablet by mouth at bedtime as needed.    [provider]  LORazepam (ATIVAN) 1 MG tablet Take 1 tablet by mouth daily.    [provider]  Multiple Vitamin (MULTIVITAMIN) tablet Take 1 tablet by mouth daily.    [provider]  Na Sulfate-K Sulfate-Mg Sulf 17.5-3.13-1.6 GM/177ML SOLN TAKE 1 KIT BY MOUTH ONCE.    [provider]  Omega-3 Fatty Acids (FISH OIL OMEGA-3 PO) Take 1,000 mg by mouth daily.    [provider]  Omega-3 Fatty Acids (FISH OIL) 1000 MG CAPS Take by mouth daily.    [provider]  predniSONE  (DELTASONE ) 10 MG tablet Take 10 mg by mouth as directed.    [provider]  Probiotic Product (PROBIOTIC PEARLS PO) Take 1 tablet by mouth daily.    [provider]  simvastatin  (ZOCOR ) 40 MG tablet TAKE 1 TABLET BY MOUTH ONCE DAILY AT BEDTIME 08/05/23   Burns, Glade PARAS, MD  SPORT SUNSCREEN SPF 30 EX apply topically to face and body daily for 30    [provider]  Turmeric 500 MG CAPS Take 1,000 mg by mouth daily.    [provider]  valACYclovir (VALTREX) 1000 MG tablet TAKE 1/2 TABLET BY MOUTH X 3-5 DAYS AS NEEDED 11/30/22   Geofm Glade PARAS, MD    Family History Family History  Problem Relation Age of Onset   Arthritis Mother    Stroke Mother 49   Alcohol abuse Father    Hyperlipidemia Sister    Hypertension  Sister    Atrial fibrillation Sister    Breast cancer Paternal Aunt    Diabetes Maternal Grandmother    Heart disease Paternal Grandmother        CAD; no MI   Stroke Paternal Grandmother        in 109s   Stroke Paternal Grandfather        in late 57s  Colon cancer Neg Hx     Social History Social History   Tobacco Use   Smoking status: Never    Passive exposure: Never   Smokeless tobacco: Never  Vaping Use   Vaping status: Never Used  Substance Use Topics   Alcohol use: Yes    Comment: decreasing wine intake   Drug use: No     Allergies   Patient has no known allergies.   Review of Systems Review of Systems As per HPI   Physical Exam Triage Vital Signs ED Triage Vitals  Encounter Vitals Group     BP 10/18/23 1227 129/81     Girls Systolic BP Percentile --      Girls Diastolic BP Percentile --      Boys Systolic BP Percentile --      Boys Diastolic BP Percentile --      Pulse Rate 10/18/23 1227 71     Resp 10/18/23 1227 18     Temp 10/18/23 1227 98 F (36.7 C)     Temp Source 10/18/23 1227 Oral     SpO2 10/18/23 1227 98 %     Weight 10/18/23 1226 190 lb 0.6 oz (86.2 kg)     Height --      Head Circumference --      Peak Flow --      Pain Score 10/18/23 1224 5     Pain Loc --      Pain Education --      Exclude from Growth Chart --    No data found.  Updated Vital Signs BP 129/81 (BP Location: Left Arm)   Pulse 71   Temp 98 F (36.7 C) (Oral)   Resp 18   Wt 190 lb 0.6 oz (86.2 kg)   SpO2 98%   BMI 29.76 kg/m   Physical Exam Vitals and nursing note reviewed.  Constitutional:      General: She is not in acute distress.    Appearance: Normal appearance.  HENT:     Mouth/Throat:     Pharynx: Oropharynx is clear.  Cardiovascular:     Rate and Rhythm: Normal rate and regular rhythm.     Pulses: Normal pulses.     Heart sounds: Normal heart sounds.  Pulmonary:     Effort: Pulmonary effort is normal.     Breath sounds: Normal breath  sounds.  Musculoskeletal:     Comments: 2+ swelling of right dorsal hand, 1+ swelling area of right forearm. Areas are warm and erythematous. No stinger/foreign body noted. The left outer thigh has small punctate lesion, again no foreign body. This area is not swollen or red.   Neurological:     Mental Status: She is alert and oriented to person, place, and time.     UC Treatments / Results  Labs (all labs ordered are listed, but only abnormal results are displayed) Labs Reviewed - No data to display  EKG  Radiology No results found.  Procedures Procedures   Medications Ordered in UC Medications  triamcinolone  acetonide (KENALOG -40) injection 40 mg (40 mg Intramuscular Given 10/18/23 1259)    Initial Impression / Assessment and Plan / UC Course  I have reviewed the triage vital signs and the nursing notes.  Pertinent labs & imaging results that were available during my care of the patient were reviewed by me and considered in my medical decision making (see chart for details).  Wasp stings No red flags; no respiratory distress, no oral swelling. Stable vitals.  IM steroid given in clinic today for acute relief of itching and swelling Advised elevate at home, ice, hydrocortisone topically BID prn Return precautions Agrees to plan, no questions   Final Clinical Impressions(s) / UC Diagnoses   Final diagnoses:  Swelling of right hand  Wasp sting, accidental or unintentional, initial encounter  Itching     Discharge Instructions      The steroid injection can start to work in about 30 minutes to reduce itching and swelling.   At home, continue to ice and elevate the hand Apply ice for 20 minutes at a time (wrap in a towel first) Hydrocortisone cream can be used topically twice daily for further itch relief      ED Prescriptions   None    PDMP not reviewed this encounter.   Jeryl Stabs, PA-C 10/18/23 1307

## 2023-10-18 NOTE — Discharge Instructions (Addendum)
 The steroid injection can start to work in about 30 minutes to reduce itching and swelling.   At home, continue to ice and elevate the hand Apply ice for 20 minutes at a time (wrap in a towel first) Hydrocortisone cream can be used topically twice daily for further itch relief

## 2023-10-29 ENCOUNTER — Encounter: Payer: Self-pay | Admitting: Internal Medicine

## 2023-11-01 ENCOUNTER — Other Ambulatory Visit: Payer: Self-pay | Admitting: Internal Medicine

## 2023-11-15 ENCOUNTER — Encounter: Payer: Self-pay | Admitting: Internal Medicine

## 2023-11-17 ENCOUNTER — Encounter: Payer: Self-pay | Admitting: Internal Medicine

## 2023-11-17 NOTE — Progress Notes (Unsigned)
 Subjective:    Patient ID: Jasmine Bray, female    DOB: May 16, 1950, 73 y.o.   MRN: [id number]      HPI Jasmine Bray is here for  Chief Complaint  Patient presents with   Fatigue         Medications and allergies reviewed with patient and updated if appropriate.  Current Outpatient Medications on File Prior to Visit  Medication Sig Dispense Refill   albuterol  (VENTOLIN  HFA) 108 (90 Base) MCG/ACT inhaler Inhale 2 puffs into the lungs every 4 (four) hours as needed for wheezing or shortness of breath.     ALPRAZolam (XANAX) 1 MG tablet Take 1 mg by mouth 3 (three) times daily as needed for anxiety.     amLODipine  (NORVASC ) 5 MG tablet TAKE 1 TABLET (5 MG TOTAL) BY MOUTH DAILY. 90 tablet 1   aspirin EC (ASPIRIN EC ADULT LOW DOSE) 81 MG tablet Take 81 mg by mouth daily.     aspirin EC 81 MG tablet Take 81 mg by mouth daily.     buPROPion (WELLBUTRIN XL) 150 MG 24 hr tablet Take 300 mg by mouth daily.     Cholecalciferol (VITAMIN D3) 3000 UNITS TABS Take 1 capsule by mouth daily. Taking 1000 units daily     escitalopram (LEXAPRO) 20 MG tablet Take 20 mg by mouth daily.     estradiol (ESTRACE) 0.1 MG/GM vaginal cream Place 2 g vaginally. 1 GRAM 3x A WEEK      estradiol (EVAMIST) 1.53 MG/SPRAY transdermal spray Evamist 1.53 mg/spray (1.7 %) transdermal spray  APPLY 1 SPRAY DAILY AS DIRECTED     famotidine (PEPCID) 20 MG tablet Take 1 tablet by mouth 2 (two) times daily.     gabapentin  (NEURONTIN ) 300 MG capsule Take 1 capsule (300 mg total) by mouth 3 (three) times daily. 180 capsule 3   hydrochlorothiazide  (HYDRODIURIL ) 25 MG tablet Take 1 tablet (25 mg total) by mouth daily. 90 tablet 1   ibuprofen (ADVIL) 200 MG tablet Take 400 mg by mouth 2 (two) times daily as needed.     Ibuprofen-diphenhydrAMINE HCl (ADVIL PM) 200-25 MG CAPS Take 1 tablet by mouth at bedtime as needed.     LORazepam (ATIVAN) 1 MG tablet Take 1 tablet by mouth daily.     Multiple Vitamin (MULTIVITAMIN)  tablet Take 1 tablet by mouth daily.     Na Sulfate-K Sulfate-Mg Sulf 17.5-3.13-1.6 GM/177ML SOLN TAKE 1 KIT BY MOUTH ONCE.     nystatin cream (MYCOSTATIN) APPLY TO AFFECTED AREA BY TOPICAL ROUTE 2 TIMES PER DAY AS NEEDED     Omega-3 Fatty Acids (FISH OIL OMEGA-3 PO) Take 1,000 mg by mouth daily.     Omega-3 Fatty Acids (FISH OIL) 1000 MG CAPS Take by mouth daily.     predniSONE  (DELTASONE ) 10 MG tablet Take 10 mg by mouth as directed.     Probiotic Product (PROBIOTIC PEARLS PO) Take 1 tablet by mouth daily.     SHINGRIX injection      simvastatin  (ZOCOR ) 40 MG tablet TAKE 1 TABLET BY MOUTH ONCE DAILY AT BEDTIME 90 tablet 1   SPORT SUNSCREEN SPF 30 EX apply topically to face and body daily for 30     Turmeric 500 MG CAPS Take 1,000 mg by mouth daily.     valACYclovir (VALTREX) 1000 MG tablet TAKE 1/2 TABLET BY MOUTH X 3-5 DAYS AS NEEDED 30 tablet 2   Vilazodone HCl (VIIBRYD) 40 MG TABS      Vilazodone  HCl 20 MG TABS      No current facility-administered medications on file prior to visit.    Review of Systems     Objective:  There were no vitals filed for this visit. BP Readings from Last 3 Encounters:  10/18/23 129/81  08/17/23 122/76  06/27/23 132/74   Wt Readings from Last 3 Encounters:  10/18/23 190 lb 0.6 oz (86.2 kg)  08/17/23 190 lb (86.2 kg)  06/27/23 194 lb (88 kg)   There is no height or weight on file to calculate BMI.    Physical Exam         Assessment & Plan:    See Problem List for Assessment and Plan of chronic medical problems.

## 2023-11-18 ENCOUNTER — Ambulatory Visit: Admitting: Internal Medicine

## 2023-11-18 VITALS — BP 150/60 | HR 59 | Temp 98.1°F

## 2023-11-18 DIAGNOSIS — R5383 Other fatigue: Secondary | ICD-10-CM

## 2023-11-18 DIAGNOSIS — F1010 Alcohol abuse, uncomplicated: Secondary | ICD-10-CM | POA: Diagnosis not present

## 2023-11-18 DIAGNOSIS — R7303 Prediabetes: Secondary | ICD-10-CM | POA: Diagnosis not present

## 2023-11-18 DIAGNOSIS — I10 Essential (primary) hypertension: Secondary | ICD-10-CM | POA: Diagnosis not present

## 2023-11-18 DIAGNOSIS — F3289 Other specified depressive episodes: Secondary | ICD-10-CM | POA: Diagnosis not present

## 2023-11-18 LAB — COMPREHENSIVE METABOLIC PANEL WITH GFR
ALT: 19 U/L (ref 0–35)
AST: 17 U/L (ref 0–37)
Albumin: 4.3 g/dL (ref 3.5–5.2)
Alkaline Phosphatase: 54 U/L (ref 39–117)
BUN: 20 mg/dL (ref 6–23)
CO2: 26 meq/L (ref 19–32)
Calcium: 9.2 mg/dL (ref 8.4–10.5)
Chloride: 102 meq/L (ref 96–112)
Creatinine, Ser: 0.65 mg/dL (ref 0.40–1.20)
GFR: 87.14 mL/min (ref >60.00)
Glucose, Bld: 102 mg/dL — ABNORMAL HIGH (ref 70–99)
Potassium: 3.7 meq/L (ref 3.5–5.1)
Sodium: 138 meq/L (ref 135–145)
Total Bilirubin: 0.4 mg/dL (ref 0.2–1.2)
Total Protein: 7.1 g/dL (ref 6.0–8.3)

## 2023-11-18 LAB — CBC WITH DIFFERENTIAL/PLATELET
Basophils Absolute: 0.1 K/uL (ref 0.0–0.1)
Basophils Relative: 0.9 % (ref 0.0–3.0)
Eosinophils Absolute: 0.2 K/uL (ref 0.0–0.7)
Eosinophils Relative: 2.1 % (ref 0.0–5.0)
HCT: 39.3 % (ref 36.0–46.0)
Hemoglobin: 13.3 g/dL (ref 12.0–15.0)
Lymphocytes Relative: 30.1 % (ref 12.0–46.0)
Lymphs Abs: 2.5 K/uL (ref 0.7–4.0)
MCHC: 33.9 g/dL (ref 30.0–36.0)
MCV: 93.5 fl (ref 78.0–100.0)
Monocytes Absolute: 0.6 K/uL (ref 0.1–1.0)
Monocytes Relative: 7.8 % (ref 3.0–12.0)
Neutro Abs: 4.9 K/uL (ref 1.4–7.7)
Neutrophils Relative %: 59.1 % (ref 43.0–77.0)
Platelets: 231 K/uL (ref 150.0–400.0)
RBC: 4.21 Mil/uL (ref 3.87–5.11)
RDW: 13.7 % (ref 11.5–15.5)
WBC: 8.3 K/uL (ref 4.0–10.5)

## 2023-11-18 LAB — TSH: TSH: 1.46 u[IU]/mL (ref 0.35–5.50)

## 2023-11-18 LAB — HEMOGLOBIN A1C: Hgb A1c MFr Bld: 6.1 % (ref 4.6–6.5)

## 2023-11-18 LAB — VITAMIN B12: Vitamin B-12: 334 pg/mL (ref 211–911)

## 2023-11-18 MED ORDER — NALTREXONE HCL 50 MG PO TABS: ORAL_TABLET | ORAL | 1 refills | Status: DC

## 2023-11-18 NOTE — Assessment & Plan Note (Signed)
 Acute Experiencing increased fatigue Likely multifactorial related to depression, alcoholism, weight gain and being more sedentary Check CBC, CMP, TSH, B12 level, thiamine level He recently has not been eating as well and encouraged her to eat better Will work on decreasing alcohol Wants to get back to regular exercise Should follow-up with psychiatry to work on depression

## 2023-11-18 NOTE — Assessment & Plan Note (Signed)
 Chronic Blood pressure is elevated here today No change in medication today Will recheck in a month when she comes back Continue amlodipine  5 mg daily, hydrochlorothiazide  25 mg daily

## 2023-11-18 NOTE — Assessment & Plan Note (Signed)
 Chronic Following with psychiatry Not ideally controlled Currently taking Wellbutrin XL 150 mg daily and Lexapro 20 mg daily Her psychiatrist did try to put her on a different medication but it was too expensive and she was not able to afford it-encouraged her to reach out to her psychiatrist to discuss other options Depression is not well-controlled which is promoting the alcohol abuse and the alcohol use is likely making her current medications less effective

## 2023-11-18 NOTE — Patient Instructions (Addendum)
      Blood work was ordered.       Medications changes include :   naltrexone  25 mg daily x 2 weeks then increase to 50 mg if tolaterated      Return in about 4 weeks (around 12/16/2023) for follow up.

## 2023-11-18 NOTE — Assessment & Plan Note (Signed)
 Chronic For a while she has been drinking too much alcohol Some nights she will drink a whole month warning or a few shots of tequila She has tried stopping but has not been successful She does not want to discuss with her psychiatrist She is interested in trying something that may help with some of the cravings Trial of naltrexone  25 mg daily for 2 weeks then increase to 50 mg daily if tolerated Follow-up in 1 month Discussed that she would benefit from seeing a therapist or getting some other help to AA/online therapy

## 2023-11-18 NOTE — Assessment & Plan Note (Signed)
 Chronic Lab Results  Component Value Date   HGBA1C 6.1 11/18/2023   Check a1c Low sugar / carb diet Stressed regular exercise

## 2023-11-19 ENCOUNTER — Ambulatory Visit: Payer: Self-pay | Admitting: Internal Medicine

## 2023-11-22 LAB — VITAMIN B1: Vitamin B1 (Thiamine): 17 nmol/L (ref 8–30)

## 2023-12-15 ENCOUNTER — Encounter: Payer: Self-pay | Admitting: Internal Medicine

## 2023-12-15 NOTE — Progress Notes (Deleted)
 Subjective:    Patient ID: Jasmine Bray, female    DOB: [birth year], 73 y.o.   MRN: [id number]     HPI Jasmine Bray is here for follow up of her chronic medical problems - depression, fatigue, alcohol abuse.  Started naltrexone  4 weeks ago.    Medications and allergies reviewed with patient and updated if appropriate.  Current Outpatient Medications on File Prior to Visit  Medication Sig Dispense Refill   albuterol  (VENTOLIN  HFA) 108 (90 Base) MCG/ACT inhaler Inhale 2 puffs into the lungs every 4 (four) hours as needed for wheezing or shortness of breath.     ALPRAZolam (XANAX) 1 MG tablet Take 1 mg by mouth 3 (three) times daily as needed for anxiety.     amLODipine  (NORVASC ) 5 MG tablet TAKE 1 TABLET (5 MG TOTAL) BY MOUTH DAILY. 90 tablet 1   aspirin EC (ASPIRIN EC ADULT LOW DOSE) 81 MG tablet Take 81 mg by mouth daily.     aspirin EC 81 MG tablet Take 81 mg by mouth daily.     buPROPion (WELLBUTRIN XL) 150 MG 24 hr tablet Take 300 mg by mouth daily.     Cholecalciferol (VITAMIN D3) 3000 UNITS TABS Take 1 capsule by mouth daily. Taking 1000 units daily     Coenzyme Q10-Vitamin E (QUNOL ULTRA COQ10 PO) Take by mouth.     escitalopram (LEXAPRO) 20 MG tablet Take 20 mg by mouth daily.     estradiol (ESTRACE) 0.1 MG/GM vaginal cream Place 2 g vaginally. 1 GRAM 3x A WEEK      estradiol (EVAMIST) 1.53 MG/SPRAY transdermal spray Evamist 1.53 mg/spray (1.7 %) transdermal spray  APPLY 1 SPRAY DAILY AS DIRECTED     famotidine (PEPCID) 20 MG tablet Take 1 tablet by mouth 2 (two) times daily.     hydrochlorothiazide  (HYDRODIURIL ) 25 MG tablet Take 1 tablet (25 mg total) by mouth daily. 90 tablet 1   Ibuprofen-diphenhydrAMINE HCl (ADVIL PM) 200-25 MG CAPS Take 1 tablet by mouth at bedtime as needed.     Multiple Vitamin (MULTIVITAMIN) tablet Take 1 tablet by mouth daily.     Na Sulfate-K Sulfate-Mg Sulf 17.5-3.13-1.6 GM/177ML SOLN TAKE 1 KIT BY MOUTH ONCE.     naltrexone  (DEPADE) 50 MG  tablet Take 25 mg daily for 2 weeks then increase to 50 mg daily 30 tablet 1   nystatin cream (MYCOSTATIN) APPLY TO AFFECTED AREA BY TOPICAL ROUTE 2 TIMES PER DAY AS NEEDED     Omega-3 Fatty Acids (FISH OIL OMEGA-3 PO) Take 1,000 mg by mouth daily.     Omega-3 Fatty Acids (FISH OIL) 1000 MG CAPS Take by mouth daily.     Probiotic Product (PROBIOTIC PEARLS PO) Take 1 tablet by mouth daily.     simvastatin  (ZOCOR ) 40 MG tablet TAKE 1 TABLET BY MOUTH ONCE DAILY AT BEDTIME 90 tablet 1   SPORT SUNSCREEN SPF 30 EX apply topically to face and body daily for 30     Turmeric 500 MG CAPS Take 1,000 mg by mouth daily.     valACYclovir (VALTREX) 1000 MG tablet TAKE 1/2 TABLET BY MOUTH X 3-5 DAYS AS NEEDED 30 tablet 2   No current facility-administered medications on file prior to visit.     Review of Systems     Objective:  There were no vitals filed for this visit. BP Readings from Last 3 Encounters:  11/18/23 (!) 150/60  10/18/23 129/81  08/17/23 122/76   Wt Readings from  Last 3 Encounters:  10/18/23 190 lb 0.6 oz (86.2 kg)  08/17/23 190 lb (86.2 kg)  06/27/23 194 lb (88 kg)   There is no height or weight on file to calculate BMI.    Physical Exam     Lab Results  Component Value Date   WBC 8.3 11/18/2023   HGB 13.3 11/18/2023   HCT 39.3 11/18/2023   PLT 231.0 11/18/2023   GLUCOSE 102 (H) 11/18/2023   CHOL 200 05/09/2023   TRIG 142.0 05/09/2023   HDL 70.80 05/09/2023   LDLDIRECT 113.0 01/20/2015   LDLCALC 101 (H) 05/09/2023   ALT 19 11/18/2023   AST 17 11/18/2023   NA 138 11/18/2023   K 3.7 11/18/2023   CL 102 11/18/2023   CREATININE 0.65 11/18/2023   BUN 20 11/18/2023   CO2 26 11/18/2023   TSH 1.46 11/18/2023   HGBA1C 6.1 11/18/2023     Assessment & Plan:    See Problem List for Assessment and Plan of chronic medical problems.

## 2023-12-15 NOTE — Patient Instructions (Incomplete)
      Blood work was ordered.       Medications changes include :   None    A referral was ordered and someone will call you to schedule an appointment.     No follow-ups on file.

## 2023-12-16 ENCOUNTER — Ambulatory Visit: Admitting: Internal Medicine

## 2023-12-16 DIAGNOSIS — R5383 Other fatigue: Secondary | ICD-10-CM

## 2023-12-16 DIAGNOSIS — F3289 Other specified depressive episodes: Secondary | ICD-10-CM

## 2023-12-16 DIAGNOSIS — F1010 Alcohol abuse, uncomplicated: Secondary | ICD-10-CM

## 2023-12-21 ENCOUNTER — Other Ambulatory Visit: Payer: Self-pay | Admitting: Internal Medicine

## 2023-12-26 ENCOUNTER — Encounter: Payer: Self-pay | Admitting: Internal Medicine

## 2023-12-26 NOTE — Progress Notes (Deleted)
 Subjective:    Patient ID: Jasmine Bray, female    DOB: [birth year], 73 y.o.   MRN: [id number]     HPI Jasmine Bray is here for follow up of her chronic medical problems - depression, fatigue, alcohol abuse.  She see Dr Vincente for her depression.   Started naltrexone  4 weeks ago.       Medications and allergies reviewed with patient and updated if appropriate.  Current Outpatient Medications on File Prior to Visit  Medication Sig Dispense Refill   albuterol  (VENTOLIN  HFA) 108 (90 Base) MCG/ACT inhaler Inhale 2 puffs into the lungs every 4 (four) hours as needed for wheezing or shortness of breath.     ALPRAZolam (XANAX) 1 MG tablet Take 1 mg by mouth 3 (three) times daily as needed for anxiety.     amLODipine  (NORVASC ) 5 MG tablet TAKE 1 TABLET (5 MG TOTAL) BY MOUTH DAILY. 90 tablet 1   aspirin EC (ASPIRIN EC ADULT LOW DOSE) 81 MG tablet Take 81 mg by mouth daily.     aspirin EC 81 MG tablet Take 81 mg by mouth daily.     buPROPion (WELLBUTRIN XL) 150 MG 24 hr tablet Take 300 mg by mouth daily.     Cholecalciferol (VITAMIN D3) 3000 UNITS TABS Take 1 capsule by mouth daily. Taking 1000 units daily     Coenzyme Q10-Vitamin E (QUNOL ULTRA COQ10 PO) Take by mouth.     escitalopram (LEXAPRO) 20 MG tablet Take 20 mg by mouth daily.     estradiol (ESTRACE) 0.1 MG/GM vaginal cream Place 2 g vaginally. 1 GRAM 3x A WEEK      estradiol (EVAMIST) 1.53 MG/SPRAY transdermal spray Evamist 1.53 mg/spray (1.7 %) transdermal spray  APPLY 1 SPRAY DAILY AS DIRECTED     famotidine (PEPCID) 20 MG tablet Take 1 tablet by mouth 2 (two) times daily.     hydrochlorothiazide  (HYDRODIURIL ) 25 MG tablet Take 1 tablet by mouth once daily 90 tablet 0   Ibuprofen-diphenhydrAMINE HCl (ADVIL PM) 200-25 MG CAPS Take 1 tablet by mouth at bedtime as needed.     Multiple Vitamin (MULTIVITAMIN) tablet Take 1 tablet by mouth daily.     Na Sulfate-K Sulfate-Mg Sulf 17.5-3.13-1.6 GM/177ML SOLN TAKE 1 KIT BY MOUTH  ONCE.     naltrexone  (DEPADE) 50 MG tablet Take 25 mg daily for 2 weeks then increase to 50 mg daily 30 tablet 1   nystatin cream (MYCOSTATIN) APPLY TO AFFECTED AREA BY TOPICAL ROUTE 2 TIMES PER DAY AS NEEDED     Omega-3 Fatty Acids (FISH OIL OMEGA-3 PO) Take 1,000 mg by mouth daily.     Omega-3 Fatty Acids (FISH OIL) 1000 MG CAPS Take by mouth daily.     Probiotic Product (PROBIOTIC PEARLS PO) Take 1 tablet by mouth daily.     simvastatin  (ZOCOR ) 40 MG tablet TAKE 1 TABLET BY MOUTH ONCE DAILY AT BEDTIME 90 tablet 1   SPORT SUNSCREEN SPF 30 EX apply topically to face and body daily for 30     Turmeric 500 MG CAPS Take 1,000 mg by mouth daily.     valACYclovir (VALTREX) 1000 MG tablet TAKE 1/2 TABLET BY MOUTH X 3-5 DAYS AS NEEDED 30 tablet 2   No current facility-administered medications on file prior to visit.     Review of Systems     Objective:  There were no vitals filed for this visit. BP Readings from Last 3 Encounters:  11/18/23 (!) 150/60  10/18/23 129/81  08/17/23 122/76   Wt Readings from Last 3 Encounters:  10/18/23 190 lb 0.6 oz (86.2 kg)  08/17/23 190 lb (86.2 kg)  06/27/23 194 lb (88 kg)   There is no height or weight on file to calculate BMI.    Physical Exam     Lab Results  Component Value Date   WBC 8.3 11/18/2023   HGB 13.3 11/18/2023   HCT 39.3 11/18/2023   PLT 231.0 11/18/2023   GLUCOSE 102 (H) 11/18/2023   CHOL 200 05/09/2023   TRIG 142.0 05/09/2023   HDL 70.80 05/09/2023   LDLDIRECT 113.0 01/20/2015   LDLCALC 101 (H) 05/09/2023   ALT 19 11/18/2023   AST 17 11/18/2023   NA 138 11/18/2023   K 3.7 11/18/2023   CL 102 11/18/2023   CREATININE 0.65 11/18/2023   BUN 20 11/18/2023   CO2 26 11/18/2023   TSH 1.46 11/18/2023   HGBA1C 6.1 11/18/2023     Assessment & Plan:    See Problem List for Assessment and Plan of chronic medical problems.

## 2023-12-27 ENCOUNTER — Ambulatory Visit: Admitting: Internal Medicine

## 2023-12-27 DIAGNOSIS — R5383 Other fatigue: Secondary | ICD-10-CM

## 2023-12-27 DIAGNOSIS — F3289 Other specified depressive episodes: Secondary | ICD-10-CM

## 2023-12-27 DIAGNOSIS — I10 Essential (primary) hypertension: Secondary | ICD-10-CM

## 2023-12-27 DIAGNOSIS — F1010 Alcohol abuse, uncomplicated: Secondary | ICD-10-CM

## 2024-01-03 NOTE — Progress Notes (Unsigned)
 Subjective:    Patient ID: Jasmine Bray, female    DOB: [birth year], 73 y.o.   MRN: [id number]     HPI Jasmine Bray is here for follow up of her chronic medical problems - depression, fatigue, alcohol abuse.  She see Jasmine Bray for her depression.   Started naltrexone  4 weeks ago.       Medications and allergies reviewed with patient and updated if appropriate.  Current Outpatient Medications on File Prior to Visit  Medication Sig Dispense Refill   albuterol  (VENTOLIN  HFA) 108 (90 Base) MCG/ACT inhaler Inhale 2 puffs into the lungs every 4 (four) hours as needed for wheezing or shortness of breath.     ALPRAZolam (XANAX) 1 MG tablet Take 1 mg by mouth 3 (three) times daily as needed for anxiety.     amLODipine  (NORVASC ) 5 MG tablet TAKE 1 TABLET (5 MG TOTAL) BY MOUTH DAILY. 90 tablet 1   aspirin EC (ASPIRIN EC ADULT LOW DOSE) 81 MG tablet Take 81 mg by mouth daily.     aspirin EC 81 MG tablet Take 81 mg by mouth daily.     buPROPion (WELLBUTRIN XL) 150 MG 24 hr tablet Take 300 mg by mouth daily.     Cholecalciferol (VITAMIN D3) 3000 UNITS TABS Take 1 capsule by mouth daily. Taking 1000 units daily     Coenzyme Q10-Vitamin E (QUNOL ULTRA COQ10 PO) Take by mouth.     escitalopram (LEXAPRO) 20 MG tablet Take 20 mg by mouth daily.     estradiol (ESTRACE) 0.1 MG/GM vaginal cream Place 2 g vaginally. 1 GRAM 3x A WEEK      estradiol (EVAMIST) 1.53 MG/SPRAY transdermal spray Evamist 1.53 mg/spray (1.7 %) transdermal spray  APPLY 1 SPRAY DAILY AS DIRECTED     famotidine (PEPCID) 20 MG tablet Take 1 tablet by mouth 2 (two) times daily.     hydrochlorothiazide  (HYDRODIURIL ) 25 MG tablet Take 1 tablet by mouth once daily 90 tablet 0   Ibuprofen-diphenhydrAMINE HCl (ADVIL PM) 200-25 MG CAPS Take 1 tablet by mouth at bedtime as needed.     Multiple Vitamin (MULTIVITAMIN) tablet Take 1 tablet by mouth daily.     Na Sulfate-K Sulfate-Mg Sulf 17.5-3.13-1.6 GM/177ML SOLN TAKE 1 KIT BY MOUTH  ONCE.     naltrexone  (DEPADE) 50 MG tablet Take 25 mg daily for 2 weeks then increase to 50 mg daily 30 tablet 1   nystatin cream (MYCOSTATIN) APPLY TO AFFECTED AREA BY TOPICAL ROUTE 2 TIMES PER DAY AS NEEDED     Omega-3 Fatty Acids (FISH OIL OMEGA-3 PO) Take 1,000 mg by mouth daily.     Omega-3 Fatty Acids (FISH OIL) 1000 MG CAPS Take by mouth daily.     Probiotic Product (PROBIOTIC PEARLS PO) Take 1 tablet by mouth daily.     simvastatin  (ZOCOR ) 40 MG tablet TAKE 1 TABLET BY MOUTH ONCE DAILY AT BEDTIME 90 tablet 1   SPORT SUNSCREEN SPF 30 EX apply topically to face and body daily for 30     Turmeric 500 MG CAPS Take 1,000 mg by mouth daily.     valACYclovir (VALTREX) 1000 MG tablet TAKE 1/2 TABLET BY MOUTH X 3-5 DAYS AS NEEDED 30 tablet 2   No current facility-administered medications on file prior to visit.     Review of Systems     Objective:  There were no vitals filed for this visit. BP Readings from Last 3 Encounters:  11/18/23 (!) 150/60  10/18/23 129/81  08/17/23 122/76   Wt Readings from Last 3 Encounters:  10/18/23 190 lb 0.6 oz (86.2 kg)  08/17/23 190 lb (86.2 kg)  06/27/23 194 lb (88 kg)   There is no height or weight on file to calculate BMI.    Physical Exam     Lab Results  Component Value Date   WBC 8.3 11/18/2023   HGB 13.3 11/18/2023   HCT 39.3 11/18/2023   PLT 231.0 11/18/2023   GLUCOSE 102 (H) 11/18/2023   CHOL 200 05/09/2023   TRIG 142.0 05/09/2023   HDL 70.80 05/09/2023   LDLDIRECT 113.0 01/20/2015   LDLCALC 101 (H) 05/09/2023   ALT 19 11/18/2023   AST 17 11/18/2023   NA 138 11/18/2023   K 3.7 11/18/2023   CL 102 11/18/2023   CREATININE 0.65 11/18/2023   BUN 20 11/18/2023   CO2 26 11/18/2023   TSH 1.46 11/18/2023   HGBA1C 6.1 11/18/2023     Assessment & Plan:    See Problem List for Assessment and Plan of chronic medical problems.

## 2024-01-04 ENCOUNTER — Ambulatory Visit (INDEPENDENT_AMBULATORY_CARE_PROVIDER_SITE_OTHER): Admitting: Internal Medicine

## 2024-01-04 ENCOUNTER — Encounter: Payer: Self-pay | Admitting: Internal Medicine

## 2024-01-04 VITALS — BP 126/80 | HR 78 | Temp 98.3°F | Ht 67.0 in | Wt 190.0 lb

## 2024-01-04 DIAGNOSIS — M25562 Pain in left knee: Secondary | ICD-10-CM

## 2024-01-04 DIAGNOSIS — F3289 Other specified depressive episodes: Secondary | ICD-10-CM | POA: Diagnosis not present

## 2024-01-04 DIAGNOSIS — I10 Essential (primary) hypertension: Secondary | ICD-10-CM

## 2024-01-04 DIAGNOSIS — G8929 Other chronic pain: Secondary | ICD-10-CM

## 2024-01-04 DIAGNOSIS — F1010 Alcohol abuse, uncomplicated: Secondary | ICD-10-CM

## 2024-01-04 MED ORDER — NALTREXONE HCL 50 MG PO TABS: 50.0000 mg | ORAL_TABLET | Freq: Every day | ORAL | 1 refills | Status: AC

## 2024-01-04 NOTE — Assessment & Plan Note (Signed)
 Chronic Blood pressure controlled Continue amlodipine  5 mg daily, hydrochlorothiazide  25 mg daily

## 2024-01-04 NOTE — Assessment & Plan Note (Signed)
 Chronic Recently stopped drinking, but has had 2 relapses Continue naltrexone  25 mg daily-discussed that she can increase this to 50 mg daily to see if that helps with some of her cravings-prescription refilled She does feel better and I think she is motivated to continue her abstinence Will start working on healthy lifestyle

## 2024-01-04 NOTE — Patient Instructions (Addendum)
    Medications changes include :   None    A referral was ordered for Dr Claudene - sports medicine and someone will call you to schedule an appointment.     Return in about 5 months (around 06/03/2024).

## 2024-01-04 NOTE — Assessment & Plan Note (Signed)
 Chronic Has had injections in the past and still having pain She feels like there may be some fluid on it.  She would like to see someone different and has a lot of friends to see Dr. Claudene and would like to see him Referral order for [facility]-Dr. Claudene

## 2024-01-04 NOTE — Assessment & Plan Note (Signed)
 Chronic Improved, controlled Management per psychiatrist-Dr. Vincente

## 2024-01-05 ENCOUNTER — Ambulatory Visit: Payer: PPO

## 2024-01-10 DIAGNOSIS — H25041 Posterior subcapsular polar age-related cataract, right eye: Secondary | ICD-10-CM | POA: Diagnosis not present

## 2024-01-10 DIAGNOSIS — H5203 Hypermetropia, bilateral: Secondary | ICD-10-CM | POA: Diagnosis not present

## 2024-01-10 DIAGNOSIS — H2513 Age-related nuclear cataract, bilateral: Secondary | ICD-10-CM | POA: Diagnosis not present

## 2024-01-13 ENCOUNTER — Ambulatory Visit (INDEPENDENT_AMBULATORY_CARE_PROVIDER_SITE_OTHER)

## 2024-01-13 VITALS — Ht 62.0 in | Wt 190.0 lb

## 2024-01-13 DIAGNOSIS — Z0000 Encounter for general adult medical examination without abnormal findings: Secondary | ICD-10-CM

## 2024-01-13 DIAGNOSIS — Z1231 Encounter for screening mammogram for malignant neoplasm of breast: Secondary | ICD-10-CM

## 2024-01-13 DIAGNOSIS — Z1211 Encounter for screening for malignant neoplasm of colon: Secondary | ICD-10-CM

## 2024-01-13 NOTE — Patient Instructions (Addendum)
 Ms. Salvas,  Thank you for taking the time for your Medicare Wellness Visit. I appreciate your continued commitment to your health goals. Please review the care plan we discussed, and feel free to reach out if I can assist you further.  Medicare recommends these wellness visits once per year to help you and your care team stay ahead of potential health issues. These visits are designed to focus on prevention, allowing your provider to concentrate on managing your acute and chronic conditions during your regular appointments.  Please note that Annual Wellness Visits do not include a physical exam. Some assessments may be limited, especially if the visit was conducted virtually. If needed, we may recommend a separate in-person follow-up with your provider.  Ongoing Care Seeing your primary care provider every 3 to 6 months helps us  monitor your health and provide consistent, personalized care.   Referrals If a referral was made during today's visit and you haven't received any updates within two weeks, please contact the referred provider directly to check on the status.  Recommended Screenings:  Health Maintenance  Topic Date Due   Zoster (Shingles) Vaccine (1 of 2) 02/12/1969   Breast Cancer Screening  04/01/2023   Colon Cancer Screening  09/26/2023   Flu Shot  10/28/2023   COVID-19 Vaccine (4 - 2025-26 season) 11/28/2023   Medicare Annual Wellness Visit  01/12/2025   DEXA scan (bone density measurement)  07/07/2028   DTaP/Tdap/Td vaccine (3 - Td or Tdap) 07/03/2030   Pneumococcal Vaccine for age over 108  Completed   Hepatitis C Screening  Completed   Meningitis B Vaccine  Aged Out       01/13/2024   11:00 AM  Advanced Directives  Does Patient Have a Medical Advance Directive? No  Would patient like information on creating a medical advance directive? Yes (MAU/Ambulatory/Procedural Areas - Information given)   Advance Care Planning is important because it: Ensures you receive  medical care that aligns with your values, goals, and preferences. Provides guidance to your family and loved ones, reducing the emotional burden of decision-making during critical moments.  Vision: Annual vision screenings are recommended for early detection of glaucoma, cataracts, and diabetic retinopathy. These exams can also reveal signs of chronic conditions such as diabetes and high blood pressure.  Dental: Annual dental screenings help detect early signs of oral cancer, gum disease, and other conditions linked to overall health, including heart disease and diabetes.

## 2024-01-13 NOTE — Progress Notes (Addendum)
 Subjective:  Please attest and cosign this visit due to patients primary care provider not being in the office at the time the visit was completed.  (Pt of Dr Glade Hope)   Jasmine Bray is a 73 y.o. who presents for a Medicare Wellness preventive visit.  As a reminder, Annual Wellness Visits don't include a physical exam, and some assessments may be limited, especially if this visit is performed virtually. We may recommend an in-person follow-up visit with your provider if needed.  Visit Complete: Virtual I connected with  Jasmine Bray on 01/13/24 by a audio enabled telemedicine application and verified that I am speaking with the correct person using two identifiers.  Patient Location: Home  Provider Location: Office/Clinic  I discussed the limitations of evaluation and management by telemedicine. The patient expressed understanding and agreed to proceed.  Vital Signs: Because this visit was a virtual/telehealth visit, some criteria may be missing or patient reported. Any vitals not documented were not able to be obtained and vitals that have been documented are patient reported.  VideoDeclined- This patient declined Librarian, academic. Therefore the visit was completed with audio only.  Persons Participating in Visit: Patient.  AWV Questionnaire: No: Patient Medicare AWV questionnaire was not completed prior to this visit.  Cardiac Risk Factors include: advanced age (>44men, >42 women);hypertension;dyslipidemia     Objective:    Today's Vitals   01/13/24 1057  Weight: 190 lb (86.2 kg)  Height: 5' 2 (1.575 m)   Body mass index is 34.75 kg/m.     01/13/2024   11:00 AM 01/04/2023   11:15 AM 01/25/2022    1:05 PM 01/21/2021    4:43 PM 01/21/2020    3:15 PM 12/13/2018    1:29 PM 11/01/2017    4:31 PM  Advanced Directives  Does Patient Have a Medical Advance Directive? No No No No No No No   Does patient want to make changes to medical  advance directive?    Yes (MAU/Ambulatory/Procedural Areas - Information given)  No - Patient declined   Would patient like information on creating a medical advance directive? Yes (MAU/Ambulatory/Procedural Areas - Information given) No - Patient declined No - Patient declined  No - Patient declined  Yes (ED - Information included in AVS)      Data saved with a previous flowsheet row definition    Current Medications (verified) Outpatient Encounter Medications as of 01/13/2024  Medication Sig   albuterol  (VENTOLIN  HFA) 108 (90 Base) MCG/ACT inhaler Inhale 2 puffs into the lungs every 4 (four) hours as needed for wheezing or shortness of breath.   ALPRAZolam (XANAX) 1 MG tablet Take 1 mg by mouth 3 (three) times daily as needed for anxiety.   amLODipine  (NORVASC ) 5 MG tablet TAKE 1 TABLET (5 MG TOTAL) BY MOUTH DAILY.   aspirin EC (ASPIRIN EC ADULT LOW DOSE) 81 MG tablet Take 81 mg by mouth daily.   aspirin EC 81 MG tablet Take 81 mg by mouth daily.   buPROPion (WELLBUTRIN XL) 150 MG 24 hr tablet Take 300 mg by mouth daily.   Cholecalciferol (VITAMIN D3) 3000 UNITS TABS Take 1 capsule by mouth daily. Taking 1000 units daily   Coenzyme Q10-Vitamin E (QUNOL ULTRA COQ10 PO) Take by mouth.   escitalopram (LEXAPRO) 20 MG tablet Take 20 mg by mouth daily.   estradiol (ESTRACE) 0.1 MG/GM vaginal cream Place 2 g vaginally. 1 GRAM 3x A WEEK    estradiol (EVAMIST) 1.53 MG/SPRAY  transdermal spray Evamist 1.53 mg/spray (1.7 %) transdermal spray  APPLY 1 SPRAY DAILY AS DIRECTED   famotidine (PEPCID) 20 MG tablet Take 1 tablet by mouth 2 (two) times daily.   hydrochlorothiazide  (HYDRODIURIL ) 25 MG tablet Take 1 tablet by mouth once daily   Ibuprofen-diphenhydrAMINE HCl (ADVIL PM) 200-25 MG CAPS Take 1 tablet by mouth at bedtime as needed.   Multiple Vitamin (MULTIVITAMIN) tablet Take 1 tablet by mouth daily.   Na Sulfate-K Sulfate-Mg Sulf 17.5-3.13-1.6 GM/177ML SOLN TAKE 1 KIT BY MOUTH ONCE.   naltrexone   (DEPADE) 50 MG tablet Take 1 tablet (50 mg total) by mouth daily.   nystatin cream (MYCOSTATIN) APPLY TO AFFECTED AREA BY TOPICAL ROUTE 2 TIMES PER DAY AS NEEDED   Omega-3 Fatty Acids (FISH OIL OMEGA-3 PO) Take 1,000 mg by mouth daily.   Omega-3 Fatty Acids (FISH OIL) 1000 MG CAPS Take by mouth daily.   Probiotic Product (PROBIOTIC PEARLS PO) Take 1 tablet by mouth daily.   simvastatin  (ZOCOR ) 40 MG tablet TAKE 1 TABLET BY MOUTH ONCE DAILY AT BEDTIME   SPORT SUNSCREEN SPF 30 EX apply topically to face and body daily for 30   Turmeric 500 MG CAPS Take 1,000 mg by mouth daily.   valACYclovir (VALTREX) 1000 MG tablet TAKE 1/2 TABLET BY MOUTH X 3-5 DAYS AS NEEDED   No facility-administered encounter medications on file as of 01/13/2024.    Allergies (verified) Patient has no known allergies.   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   Riding accident; horse had to be put down 2011  Dr Vincente, Psychiatry        Sciatica of left side 10/10/2017   Past Surgical History:  Procedure Laterality Date   ABDOMINAL HYSTERECTOMY     & USO   ANAL FISTULA ECTOMY      X 2, Dr Nicholaus   COLONOSCOPY  2004, 2015   [facility]   INJECTION KNEE Bilateral    [facility]   MYOMECTOMY     FOR FIBROIDS, Dr Cary   steroid injection shoulder Right 05/11/2013   contusion/strain; Dr Melita   Family History  Problem Relation Age of Onset   Arthritis Mother    Stroke Mother 30   Alcohol abuse Father    Hyperlipidemia Sister    Hypertension Sister    Atrial fibrillation Sister    Breast cancer Paternal Aunt     Diabetes Maternal Grandmother    Heart disease Paternal Grandmother        CAD; no MI   Stroke Paternal Grandmother        in 51s   Stroke Paternal Grandfather        in late 17s   Colon cancer Neg Hx    Social History   Socioeconomic History   Marital status: Divorced    Spouse name: Not on file   Number of children: Not on file   Years of education: Not on file   Highest education level: Not  on file  Occupational History   Occupation: SALES  Tobacco Use   Smoking status: Never    Passive exposure: Never   Smokeless tobacco: Never  Vaping Use   Vaping status: Never Used  Substance and Sexual Activity   Alcohol use: Yes    Comment: decreasing wine intake   Drug use: No   Sexual activity: Yes  Other Topics Concern   Not on file  Social History Narrative   GETS REG EXERCISE         Social Drivers of Health   Financial Resource Strain: Low Risk  (01/13/2024)   Overall Financial Resource Strain (CARDIA)    Difficulty of Paying Living Expenses: Not hard at all  Food Insecurity: No Food Insecurity (01/13/2024)   Hunger Vital Sign    Worried About Running Out of Food in the Last Year: Never true    Ran Out of Food in the Last Year: Never true  Transportation Needs: No Transportation Needs (01/13/2024)   PRAPARE - Administrator, Civil Service (Medical): No    Lack of Transportation (Non-Medical): No  Physical Activity: Sufficiently Active (01/13/2024)   Exercise Vital Sign    Days of Exercise per Week: 7 days    Minutes of Exercise per Session: 30 min  Stress: No Stress Concern Present (01/13/2024)   [employer] of Occupational Health - Occupational Stress Questionnaire    Feeling of Stress: Not at all  Social Connections: Moderately Integrated (01/13/2024)   Social Connection and Isolation Panel    Frequency of Communication with Friends and Family: More than three times a week    Frequency of Social Gatherings with Friends and Family: Once a  week    Attends Religious Services: More than 4 times per year    Active Member of [employer] or Organizations: Yes    Attends Banker Meetings: 1 to 4 times per year    Marital Status: Divorced    Tobacco Counseling Counseling given: Not Answered    Clinical Intake:  Pre-visit preparation completed: Yes  Pain : No/denies pain     BMI - recorded: 34.75 Nutritional Status: BMI > 30  Obese Nutritional Risks: None Diabetes: No  Lab Results  Component Value Date   HGBA1C 6.1 11/18/2023   HGBA1C 5.7 03/15/2022   HGBA1C 5.9 03/10/2021     How often do you need to have someone help you when you read instructions, pamphlets, or other written materials from your doctor or pharmacy?: 1 - Never  Interpreter Needed?: No  Information entered by :: Jasmine Bray, CMA   Activities of Daily Living     01/13/2024   11:03 AM  In your present state of health, do you have any difficulty performing the following activities:  Hearing? 0  Vision? 0  Difficulty concentrating or making decisions? 0  Walking or climbing stairs? 0  Dressing or bathing? 0  Doing errands, shopping? 0  Preparing Food and eating ? N  Using the Toilet? N  In the past six months, have you accidently leaked urine? N  Do you have problems with loss of bowel control? N  Managing your Medications? N  Managing your Finances? N  Housekeeping or managing your Housekeeping? N    Patient Care Team: Geofm Glade PARAS, MD as PCP - General (Internal Medicine) Chick Venetia BRAVO, MD as Consulting Physician (Family Medicine) Vincente Grip, MD as Consulting Physician (Psychiatry) Fate Morna SAILOR, [facility] (Inactive) as Pharmacist (Pharmacist) Pa, [facility] (Neurosurgery) Waylan Cain, MD as Consulting Physician (Ophthalmology)  I have updated your Care Teams any recent Medical Services you may have received from other providers in the past year.     Assessment:   This is a  routine wellness examination for Jasmine Bray.  Hearing/Vision screen Hearing Screening - Comments:: Denies hearing difficulties   Vision Screening - Comments:: Wears rx glasses - up to date with routine eye exams with  Cain Waylan   Goals Addressed               This Visit's Progress     Patient Stated (pt-stated)        Patient stated she plans to continue exercising more and manage left knee       Depression Screen     01/13/2024   11:06 AM 01/04/2024    2:18 PM 01/04/2024    2:17 PM 01/04/2023   11:13 AM 03/15/2022    3:16 PM 03/15/2022    3:15 PM 01/25/2022    1:09 PM  PHQ 2/9 Scores  PHQ - 2 Score 0 0 0 0 0 0 0  PHQ- 9 Score 0 0  0 0      Fall Risk     01/13/2024   11:03 AM 01/04/2024    2:17 PM 01/04/2023   11:16 AM 01/03/2023   11:33 AM 03/15/2022    3:15 PM  Fall Risk   Falls in the past year? 0 0 0 0 0  Number falls in past yr: 0 0 0 0 0  Injury with Fall? 0 0 0 0 0  Risk for fall due to : No Fall Risks No Fall Risks No Fall Risks  No Fall Risks  Follow up Falls evaluation completed;Falls prevention discussed Falls evaluation completed Falls prevention discussed  Falls evaluation completed      Data saved with a previous flowsheet row definition    MEDICARE RISK AT HOME:  Medicare Risk at Home Any stairs in or around the home?: No If so, are there any without handrails?: No Home free of loose throw rugs in walkways, pet beds, electrical cords, etc?: Yes Adequate lighting in your home to reduce risk of falls?: Yes Life alert?: No Use of a cane, walker or w/c?: No Grab bars in the bathroom?: Yes Shower chair or bench in shower?: No Elevated toilet seat or a handicapped toilet?: Yes  TIMED UP AND GO:  Was the test performed?  No  Cognitive Function: 6CIT completed    01/04/2023   11:17 AM  MMSE - Mini Mental State Exam  Not completed: Unable to complete        01/13/2024   11:14 AM 01/04/2023   11:17 AM 01/25/2022    1:18 PM  6CIT Screen   What Year? 0 points 0 points 0 points  What month? 0 points 0 points 0 points  What time? 0 points 0 points 0 points  Count back from 20 0 points 0 points 0 points  Months in reverse 0 points 0 points 0 points  Repeat phrase 0 points 0 points 0 points  Total Score 0 points 0 points 0 points    Immunizations Immunization History  Administered Date(s) Administered   Fluad Quad(high Dose 65+) 03/15/2022   INFLUENZA, HIGH DOSE SEASONAL PF 01/22/2016, 01/24/2017, 01/25/2018, 01/25/2019, 01/08/2021   Influenza Inj Mdck Quad Pf 01/05/2023   Influenza Split 02/02/2011, 01/10/2012, 01/11/2020   Influenza Whole 12/22/2009   Influenza,inj,Quad PF,6+ Mos 01/17/2014   PFIZER(Purple  Top)SARS-COV-2 Vaccination 05/13/2019, 06/05/2019, 01/11/2020   Pneumococcal Conjugate-13 03/08/2016   Pneumococcal Polysaccharide-23 02/20/2019   Td 11/11/2009   Tdap 07/02/2020   Zoster, Live 01/20/2015    Screening Tests Health Maintenance  Topic Date Due   Zoster Vaccines- Shingrix (1 of 2) 02/12/1969   Mammogram  04/01/2023   Colonoscopy  09/26/2023   Influenza Vaccine  10/28/2023   COVID-19 Vaccine (4 - 2025-26 season) 11/28/2023   Medicare Annual Wellness (AWV)  01/12/2025   DEXA SCAN  07/07/2028   DTaP/Tdap/Td (3 - Td or Tdap) 07/03/2030   Pneumococcal Vaccine: 50+ Years  Completed   Hepatitis C Screening  Completed   Meningococcal B Vaccine  Aged Out    Health Maintenance Items Addressed: Mammogram ordered, Referral sent to GI for colonoscopy  Additional Screening:  Vision Screening: Recommended annual ophthalmology exams for early detection of glaucoma and other disorders of the eye. Is the patient up to date with their annual eye exam?  Yes  Who is the provider or what is the name of the office in which the patient attends annual eye exams? [facility]  Dental Screening: Recommended annual dental exams for proper oral hygiene  Community Resource Referral / Chronic Care  Management: CRR required this visit?  No   CCM required this visit?  No   Plan:    I have personally reviewed and noted the following in the patient's chart:   Medical and social history Use of alcohol, tobacco or illicit drugs  Current medications and supplements including opioid prescriptions. Patient is not currently taking opioid prescriptions. Functional ability and status Nutritional status Physical activity Advanced directives List of other physicians Hospitalizations, surgeries, and ER visits in previous 12 months Vitals Screenings to include cognitive, depression, and falls Referrals and appointments  In addition, I have reviewed and discussed with patient certain preventive protocols, quality metrics, and best practice recommendations. A written personalized care plan for preventive services as well as general preventive health recommendations were provided to patient.   Jasmine Bray, CMA   01/13/2024   After Visit Summary: (MyChart) Due to this being a telephonic visit, the after visit summary with patients personalized plan was offered to patient via MyChart   Notes: Nothing significant to report at this time.

## 2024-01-23 DIAGNOSIS — H2513 Age-related nuclear cataract, bilateral: Secondary | ICD-10-CM | POA: Diagnosis not present

## 2024-02-13 NOTE — Progress Notes (Unsigned)
 [facility] [address] [place] [address] Phone: [phone] Subjective:   Jasmine Bray, am serving as a scribe for Dr. Arthea Claudene.  I'm seeing this patient by the request  of:  Geofm Glade PARAS, MD  CC: left knee pain   Jasmine Bray  LODIE Jasmine Bray is a 73 y.o. female coming in with complaint of chronic L knee pain. Patient states that she has had steroid injections by another provider. Orthovisc series completed in May 2025. Pain is medial. Worse with ascending and descending stairs. Did land on knee last week after tripping over a bucket in her barn. Using biofreeze and aspercreme.   L knee xray 04/27/2023 IMPRESSION: 1. Tricompartmental degenerative changes with progression. 2. Small effusion.    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   Riding accident; horse had to be put down 2011  Dr Vincente, Psychiatry        Sciatica of left side 10/10/2017   Past Surgical History:  Procedure Laterality Date   ABDOMINAL HYSTERECTOMY     & USO   ANAL FISTULA ECTOMY      X 2, Dr Nicholaus   COLONOSCOPY  2004, 2015   Gessner   INJECTION KNEE Bilateral    Bioflex   MYOMECTOMY     FOR FIBROIDS, Dr Cary   steroid injection shoulder Right 05/11/2013   contusion/strain; Dr Melita   Social History   Socioeconomic History   Marital status: Divorced    Spouse name: Not on file   Number of children: Not on file   Years of education: Not on file   Highest education level: Not on file  Occupational History   Occupation: SALES  Tobacco Use   Smoking  status: Never    Passive exposure: Never   Smokeless tobacco: Never  Vaping Use   Vaping status: Never Used  Substance and Sexual Activity   Alcohol use: Yes    Comment: decreasing wine intake   Drug use: No   Sexual activity: Yes  Other Topics Concern   Not on file  Social History Narrative   GETS REG EXERCISE         Social Drivers of Health   Financial Resource Strain: Low Risk  (01/13/2024)   Overall Financial Resource Strain (CARDIA)    Difficulty of Paying Living Expenses: Not hard at all  Food Insecurity: No Food Insecurity (01/13/2024)   Hunger Vital Sign    Worried About Running Out of Food in the Last Year: Never true    Ran Out of Food in the Last Year: Never true  Transportation Needs: No Transportation Needs (01/13/2024)   PRAPARE - Administrator, Civil Service (Medical): No    Lack of Transportation (Non-Medical): No  Physical Activity: Sufficiently Active (01/13/2024)   Exercise Vital Sign    Days of Exercise per Week: 7 days    Minutes of Exercise per Session: 30 min  Stress: No Stress Concern  Present (01/13/2024)   [employer] of Occupational Health - Occupational Stress Questionnaire    Feeling of Stress: Not at all  Social Connections: Moderately Integrated (01/13/2024)   Social Connection and Isolation Panel    Frequency of Communication with Friends and Family: More than three times a week    Frequency of Social Gatherings with Friends and Family: Once a week    Attends Religious Services: More than 4 times per year    Active Member of [employer] or Organizations: Yes    Attends Banker Meetings: 1 to 4 times per year    Marital Status: Divorced   No Known Allergies Family History  Problem Relation Age of Onset   Arthritis Mother    Stroke Mother 57   Alcohol abuse Father    Hyperlipidemia Sister    Hypertension Sister    Atrial fibrillation Sister    Breast cancer Paternal Aunt    Diabetes Maternal Grandmother     Heart disease Paternal Grandmother        CAD; no MI   Stroke Paternal Grandmother        in 53s   Stroke Paternal Grandfather        in late 45s   Colon cancer Neg Hx     Current Outpatient Medications (Endocrine & Metabolic):    estradiol (EVAMIST) 1.53 MG/SPRAY transdermal spray, Evamist 1.53 mg/spray (1.7 %) transdermal spray  APPLY 1 SPRAY DAILY AS DIRECTED  Current Outpatient Medications (Cardiovascular):    amLODipine  (NORVASC ) 5 MG tablet, TAKE 1 TABLET (5 MG TOTAL) BY MOUTH DAILY.   hydrochlorothiazide  (HYDRODIURIL ) 25 MG tablet, Take 1 tablet by mouth once daily   simvastatin  (ZOCOR ) 40 MG tablet, TAKE 1 TABLET BY MOUTH ONCE DAILY AT BEDTIME  Current Outpatient Medications (Respiratory):    albuterol  (VENTOLIN  HFA) 108 (90 Base) MCG/ACT inhaler, Inhale 2 puffs into the lungs every 4 (four) hours as needed for wheezing or shortness of breath.  Current Outpatient Medications (Analgesics):    aspirin EC (ASPIRIN EC ADULT LOW DOSE) 81 MG tablet, Take 81 mg by mouth daily.   aspirin EC 81 MG tablet, Take 81 mg by mouth daily.   Current Outpatient Medications (Other):    ALPRAZolam (XANAX) 1 MG tablet, Take 1 mg by mouth 3 (three) times daily as needed for anxiety.   buPROPion (WELLBUTRIN XL) 150 MG 24 hr tablet, Take 300 mg by mouth daily.   Cholecalciferol (VITAMIN D3) 3000 UNITS TABS, Take 1 capsule by mouth daily. Taking 1000 units daily   Coenzyme Q10-Vitamin E (QUNOL ULTRA COQ10 PO), Take by mouth.   escitalopram (LEXAPRO) 20 MG tablet, Take 20 mg by mouth daily.   estradiol (ESTRACE) 0.1 MG/GM vaginal cream, Place 2 g vaginally. 1 GRAM 3x A WEEK    famotidine (PEPCID) 20 MG tablet, Take 1 tablet by mouth 2 (two) times daily.   Ibuprofen-diphenhydrAMINE HCl (ADVIL PM) 200-25 MG CAPS, Take 1 tablet by mouth at bedtime as needed.   Multiple Vitamin (MULTIVITAMIN) tablet, Take 1 tablet by mouth daily.   Na Sulfate-K Sulfate-Mg Sulf 17.5-3.13-1.6 GM/177ML SOLN, TAKE 1 KIT BY  MOUTH ONCE.   naltrexone  (DEPADE) 50 MG tablet, Take 1 tablet (50 mg total) by mouth daily.   nystatin cream (MYCOSTATIN), APPLY TO AFFECTED AREA BY TOPICAL ROUTE 2 TIMES PER DAY AS NEEDED   Omega-3 Fatty Acids (FISH OIL OMEGA-3 PO), Take 1,000 mg by mouth daily.   Omega-3 Fatty Acids (FISH OIL) 1000 MG CAPS, Take by  mouth daily.   Probiotic Product (PROBIOTIC PEARLS PO), Take 1 tablet by mouth daily.   SPORT SUNSCREEN SPF 30 EX, apply topically to face and body daily for 30   Turmeric 500 MG CAPS, Take 1,000 mg by mouth daily.   valACYclovir (VALTREX) 1000 MG tablet, TAKE 1/2 TABLET BY MOUTH X 3-5 DAYS AS NEEDED   Reviewed prior external information including notes and imaging from  primary care provider As well as notes that were available from care everywhere and other healthcare systems.  Past medical history, social, surgical and family history all reviewed in electronic medical record.  No pertanent information unless stated regarding to the chief complaint.   Review of Systems:  No headache, visual changes, nausea, vomiting, diarrhea, constipation, dizziness, abdominal pain, skin rash, fevers, chills, night sweats, weight loss, swollen lymph nodes, body aches, joint swelling, chest pain, shortness of breath, mood changes. POSITIVE muscle aches  Objective  Blood pressure 112/78, pulse (!) 127, height 5' 2 (1.575 m), weight 190 lb (86.2 kg), SpO2 93%.   General: No apparent distress alert and oriented x3 mood and affect normal, dressed appropriately.  HEENT: Pupils equal, extraocular movements intact  Respiratory: Patient's speak in full sentences and does not appear short of breath  Cardiovascular: No lower extremity edema, non tender, no erythema  Left knee exam shows significant abnormal thigh to calf ratio.  Patient is ambulatory.  Patient does have instability with the varus and valgus force.  Crepitus noted.  Limited muscular skeletal ultrasound was performed and interpreted  by CLAUDENE HUSSAR, M  Limited ultrasound shows hypoechoic changes in the patellofemoral joint with significant narrowing of the patellofemoral as well as the medial joint space.  Significant degenerative changes of the meniscus noted. Impression: Knee arthritis  After informed written and verbal consent, patient was seated on exam table. Left knee was prepped with alcohol swab and utilizing anterolateral approach, patient's left knee space was injected with 4:1  marcaine 0.5%: Kenalog  40mg /dL. Patient tolerated the procedure well without immediate complications.    Impression and Recommendations:     The above documentation has been reviewed and is accurate and complete Angelena Sand M Rynn Markiewicz, DO

## 2024-02-14 ENCOUNTER — Ambulatory Visit: Admitting: Family Medicine

## 2024-02-14 ENCOUNTER — Telehealth: Payer: Self-pay

## 2024-02-14 ENCOUNTER — Other Ambulatory Visit: Payer: Self-pay

## 2024-02-14 ENCOUNTER — Encounter: Payer: Self-pay | Admitting: Family Medicine

## 2024-02-14 VITALS — BP 112/78 | HR 127 | Ht 62.0 in | Wt 190.0 lb

## 2024-02-14 DIAGNOSIS — M1712 Unilateral primary osteoarthritis, left knee: Secondary | ICD-10-CM

## 2024-02-14 DIAGNOSIS — M25562 Pain in left knee: Secondary | ICD-10-CM

## 2024-02-14 NOTE — Telephone Encounter (Signed)
 Ran for Monovisc for left knee on 02/14/24. Case [id number]. Pending approval.

## 2024-02-14 NOTE — Patient Instructions (Addendum)
 Injection in knee today OA Stability Brace We'll get gel approval See you again in 2 months

## 2024-02-14 NOTE — Assessment & Plan Note (Addendum)
 Patient has severe arthritis noted.  Discussed with patient about icing regimen and home exercises, discussed which activities to do and which ones to avoid.  Increase activity slowly.  Discussed icing regimen.  Follow-up again in 6 to 12 weeks.  Could be a candidate for viscosupplementation.  Patient is ambulatory but does have instability noted.  Abnormal thigh to calf ratio noted.  Instability with valgus and varus force.  I do feel an OA stability brace with a Tru pull lite could be significantly beneficial for this individual and will get patient fitted very soon

## 2024-02-17 NOTE — Telephone Encounter (Signed)
 PA Required through insurance. PA faxed on 02/17/24. Pending approval.

## 2024-02-21 NOTE — Telephone Encounter (Signed)
 noted

## 2024-02-21 NOTE — Telephone Encounter (Signed)
 Patient scheduled for 04/17/24  MONOVISC authorized for left knee PRE CERT REQUIRED Copay $20 Deductible does not apply Once the OOP has been met coverage goes to 100% Patient responsible for 20% coinsurance Authorization [id number] 02/17/24-05/17/2024

## 2024-02-29 DIAGNOSIS — H25011 Cortical age-related cataract, right eye: Secondary | ICD-10-CM | POA: Diagnosis not present

## 2024-02-29 DIAGNOSIS — H25811 Combined forms of age-related cataract, right eye: Secondary | ICD-10-CM | POA: Diagnosis not present

## 2024-03-05 DIAGNOSIS — L578 Other skin changes due to chronic exposure to nonionizing radiation: Secondary | ICD-10-CM | POA: Diagnosis not present

## 2024-03-05 DIAGNOSIS — L814 Other melanin hyperpigmentation: Secondary | ICD-10-CM | POA: Diagnosis not present

## 2024-03-05 DIAGNOSIS — L821 Other seborrheic keratosis: Secondary | ICD-10-CM | POA: Diagnosis not present

## 2024-03-05 DIAGNOSIS — Z86006 Personal history of melanoma in-situ: Secondary | ICD-10-CM | POA: Diagnosis not present

## 2024-03-05 DIAGNOSIS — D485 Neoplasm of uncertain behavior of skin: Secondary | ICD-10-CM | POA: Diagnosis not present

## 2024-03-05 DIAGNOSIS — Z85828 Personal history of other malignant neoplasm of skin: Secondary | ICD-10-CM | POA: Diagnosis not present

## 2024-03-05 DIAGNOSIS — D2221 Melanocytic nevi of right ear and external auricular canal: Secondary | ICD-10-CM | POA: Diagnosis not present

## 2024-03-09 ENCOUNTER — Encounter: Payer: Self-pay | Admitting: Internal Medicine

## 2024-03-16 ENCOUNTER — Encounter

## 2024-03-18 ENCOUNTER — Other Ambulatory Visit: Payer: Self-pay | Admitting: Internal Medicine

## 2024-03-30 ENCOUNTER — Encounter: Admitting: Internal Medicine

## 2024-04-06 ENCOUNTER — Other Ambulatory Visit: Payer: Self-pay | Admitting: Internal Medicine

## 2024-04-12 NOTE — Progress Notes (Unsigned)
 " [facility] [address] [place] [address] Phone: [phone] Subjective:    I'm seeing this patient by the request  of:  Geofm Glade PARAS, MD  CC:   [name]  02/14/2024 Patient has severe arthritis noted.  Discussed with patient about icing regimen and home exercises, discussed which activities to do and which ones to avoid.  Increase activity slowly.  Discussed icing regimen.  Follow-up again in 6 to 12 weeks.  Could be a candidate for viscosupplementation.   Patient is ambulatory but does have instability noted.  Abnormal thigh to calf ratio noted.  Instability with valgus and varus force.  I do feel an OA stability brace with a Tru pull lite could be significantly beneficial for this individual and will get patient fitted very soon     Updated 04/17/2024 Jasmine Bray is a 74 y.o. 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   Riding accident; horse had to be put down 2011  Dr Vincente, Psychiatry        Sciatica of left side 10/10/2017   Past Surgical History:  Procedure Laterality Date   ABDOMINAL HYSTERECTOMY     & USO   ANAL FISTULA ECTOMY      X 2, Dr Nicholaus   COLONOSCOPY  2004, 2015   Gessner   INJECTION KNEE Bilateral    Bioflex   MYOMECTOMY     FOR FIBROIDS, Dr Cary   steroid injection shoulder Right 05/11/2013   contusion/strain; Dr Melita   Social History   Socioeconomic History   Marital status: Divorced    Spouse name: Not on file   Number of children: Not on file    Years of education: Not on file   Highest education level: Not on file  Occupational History   Occupation: SALES  Tobacco Use   Smoking status: Never    Passive exposure: Never   Smokeless tobacco: Never  Vaping Use   Vaping status: Never Used  Substance and Sexual Activity   Alcohol use: Yes    Comment: decreasing wine intake   Drug use: No   Sexual activity: Yes  Other Topics Concern   Not on file  Social History Narrative   GETS REG EXERCISE         Social Drivers of Health   Tobacco Use: Low Risk (02/14/2024)   Patient History    Smoking Tobacco Use: Never    Smokeless Tobacco Use: Never    Passive Exposure: Never  Financial Resource Strain: Low Risk (01/13/2024)   Overall Financial Resource Strain (CARDIA)    Difficulty of Paying Living Expenses: Not hard at all  Food Insecurity: No Food Insecurity (01/13/2024)   Epic    Worried About Programme Researcher, Broadcasting/film/video in the Last Year: Never true    Ran Out of Food in the Last Year: Never true  Transportation Needs:  No Transportation Needs (01/13/2024)   Epic    Lack of Transportation (Medical): No    Lack of Transportation (Non-Medical): No  Physical Activity: Sufficiently Active (01/13/2024)   Exercise Vital Sign    Days of Exercise per Week: 7 days    Minutes of Exercise per Session: 30 min  Stress: No Stress Concern Present (01/13/2024)   [employer] of Occupational Health - Occupational Stress Questionnaire    Feeling of Stress: Not at all  Social Connections: Moderately Integrated (01/13/2024)   Social Connection and Isolation Panel    Frequency of Communication with Friends and Family: More than three times a week    Frequency of Social Gatherings with Friends and Family: Once a week    Attends Religious Services: More than 4 times per year    Active Member of Clubs or Organizations: Yes    Attends Banker Meetings: 1 to 4 times per year    Marital Status: Divorced  Depression (PHQ2-9): Low Risk  (01/13/2024)   Depression (PHQ2-9)    PHQ-2 Score: 0  Alcohol Screen: Low Risk (01/13/2024)   Alcohol Screen    Last Alcohol Screening Score (AUDIT): 0  Housing: Unknown (01/13/2024)   Epic    Unable to Pay for Housing in the Last Year: No    Number of Times Moved in the Last Year: Not on file    Homeless in the Last Year: No  Utilities: Not At Risk (01/13/2024)   Epic    Threatened with loss of utilities: No  Health Literacy: Adequate Health Literacy (01/13/2024)   B1300 Health Literacy    Frequency of need for help with medical instructions: Never   Allergies[1] Family History  Problem Relation Age of Onset   Arthritis Mother    Stroke Mother 52   Alcohol abuse Father    Hyperlipidemia Sister    Hypertension Sister    Atrial fibrillation Sister    Breast cancer Paternal Aunt    Diabetes Maternal Grandmother    Heart disease Paternal Grandmother        CAD; no MI   Stroke Paternal Grandmother        in 59s   Stroke Paternal Grandfather        in late 72s   Colon cancer Neg Hx     Current Outpatient Medications (Endocrine & Metabolic):    estradiol (EVAMIST) 1.53 MG/SPRAY transdermal spray, Evamist 1.53 mg/spray (1.7 %) transdermal spray  APPLY 1 SPRAY DAILY AS DIRECTED  Current Outpatient Medications (Cardiovascular):    amLODipine  (NORVASC ) 5 MG tablet, TAKE 1 TABLET (5 MG TOTAL) BY MOUTH DAILY.   hydrochlorothiazide  (HYDRODIURIL ) 25 MG tablet, Take 1 tablet by mouth once daily   simvastatin  (ZOCOR ) 40 MG tablet, TAKE 1 TABLET BY MOUTH ONCE DAILY AT BEDTIME  Current Outpatient Medications (Respiratory):    albuterol  (VENTOLIN  HFA) 108 (90 Base) MCG/ACT inhaler, Inhale 2 puffs into the lungs every 4 (four) hours as needed for wheezing or shortness of breath.  Current Outpatient Medications (Analgesics):    aspirin EC (ASPIRIN EC ADULT LOW DOSE) 81 MG tablet, Take 81 mg by mouth daily.   aspirin EC 81 MG tablet, Take 81 mg by mouth daily.  Current Outpatient  Medications (Other):    ALPRAZolam (XANAX) 1 MG tablet, Take 1 mg by mouth 3 (three) times daily as needed for anxiety.   buPROPion (WELLBUTRIN XL) 150 MG 24 hr tablet, Take 300 mg by mouth daily.   Cholecalciferol (VITAMIN D3) 3000 UNITS  TABS, Take 1 capsule by mouth daily. Taking 1000 units daily   Coenzyme Q10-Vitamin E (QUNOL ULTRA COQ10 PO), Take by mouth.   escitalopram (LEXAPRO) 20 MG tablet, Take 20 mg by mouth daily.   estradiol (ESTRACE) 0.1 MG/GM vaginal cream, Place 2 g vaginally. 1 GRAM 3x A WEEK    famotidine (PEPCID) 20 MG tablet, Take 1 tablet by mouth 2 (two) times daily.   Ibuprofen-diphenhydrAMINE HCl (ADVIL PM) 200-25 MG CAPS, Take 1 tablet by mouth at bedtime as needed.   Multiple Vitamin (MULTIVITAMIN) tablet, Take 1 tablet by mouth daily.   Na Sulfate-K Sulfate-Mg Sulf 17.5-3.13-1.6 GM/177ML SOLN, TAKE 1 KIT BY MOUTH ONCE.   naltrexone  (DEPADE) 50 MG tablet, Take 1 tablet (50 mg total) by mouth daily.   nystatin cream (MYCOSTATIN), APPLY TO AFFECTED AREA BY TOPICAL ROUTE 2 TIMES PER DAY AS NEEDED   Omega-3 Fatty Acids (FISH OIL OMEGA-3 PO), Take 1,000 mg by mouth daily.   Omega-3 Fatty Acids (FISH OIL) 1000 MG CAPS, Take by mouth daily.   Probiotic Product (PROBIOTIC PEARLS PO), Take 1 tablet by mouth daily.   SPORT SUNSCREEN SPF 30 EX, apply topically to face and body daily for 30   Turmeric 500 MG CAPS, Take 1,000 mg by mouth daily.   valACYclovir (VALTREX) 1000 MG tablet, TAKE 1/2 TABLET BY MOUTH X 3-5 DAYS AS NEEDED   Reviewed prior external information including notes and imaging from  primary care provider As well as notes that were available from care everywhere and other healthcare systems.  Past medical history, social, surgical and family history all reviewed in electronic medical record.  No pertanent information unless stated regarding to the chief complaint.   Review of Systems:  No headache, visual changes, nausea, vomiting, diarrhea, constipation,  dizziness, abdominal pain, skin rash, fevers, chills, night sweats, weight loss, swollen lymph nodes, body aches, joint swelling, chest pain, shortness of breath, mood changes. POSITIVE muscle aches  Objective  There were no vitals taken for this visit.   General: No apparent distress alert and oriented x3 mood and affect normal, dressed appropriately.  HEENT: Pupils equal, extraocular movements intact  Respiratory: Patient's speak in full sentences and does not appear short of breath  Cardiovascular: No lower extremity edema, non tender, no erythema      Impression and Recommendations:           [1] No Known Allergies  "

## 2024-04-17 ENCOUNTER — Ambulatory Visit: Admitting: Family Medicine

## 2024-04-24 ENCOUNTER — Ambulatory Visit

## 2024-04-24 ENCOUNTER — Encounter: Payer: Self-pay | Admitting: Internal Medicine

## 2024-04-24 VITALS — Ht 62.0 in | Wt 185.0 lb

## 2024-04-24 DIAGNOSIS — Z1211 Encounter for screening for malignant neoplasm of colon: Secondary | ICD-10-CM

## 2024-04-24 MED ORDER — NA SULFATE-K SULFATE-MG SULF 17.5-3.13-1.6 GM/177ML PO SOLN: 1.0000 | Freq: Once | ORAL | 0 refills | Status: AC

## 2024-04-24 NOTE — Progress Notes (Signed)
 PCP MD at time of PV: Glade Hope, MD  __________________________________________________________________________________________________________________________________________  No egg allergy known to patient  No soy allergy known to patient No issues known to pt with past sedation with any surgeries or procedures Patient denies ever being told they had issues or difficulty with intubation  No FH of Malignant Hyperthermia Pt is not on diet pills Pt is not on  home 02  Pt is not on blood thinners  No A fib or A flutter Have any cardiac testing pending-- no  LOA: independent  No Chew or Snuff tobacco __________________________________________________________________________________________________________________________________________  Constipation: no  Prep: suprep  __________________________________________________________________________________________________________________________________________  PV completed with patient. Prep instructions reviewed and provided during apt. Rx sent to preferred pharmacy.  __________________________________________________________________________________________________________________________________________  Patient's chart reviewed by Norleen Schillings CNRA prior to previsit and patient appropriate for the [facility].  Previsit completed and red dot placed by patient's name on their procedure day (on provider's schedule).

## 2024-05-04 ENCOUNTER — Telehealth: Payer: Self-pay | Admitting: *Deleted

## 2024-05-04 ENCOUNTER — Telehealth: Payer: Self-pay | Admitting: Internal Medicine

## 2024-05-04 ENCOUNTER — Other Ambulatory Visit: Payer: Self-pay | Admitting: *Deleted

## 2024-05-04 NOTE — Telephone Encounter (Signed)
 Called patient and LM making her aware that a new set on instructions with new date and times  have been sent to her My Chart. Reviewed how to locate  them in My chart on messag as well. Left call back # for any questions.

## 2024-05-04 NOTE — Telephone Encounter (Signed)
 Called patient and LM informing her that a new set of instructions with new dates and times have been sent to her My Chart. Reviewed how to find in my chart and left Call back # for any future quesrtions.

## 2024-05-04 NOTE — Telephone Encounter (Signed)
 That is fine.  If she has been rescheduled for a different time she will need to adjust her prep instructions.  Nursing staff may need to help.

## 2024-05-04 NOTE — Telephone Encounter (Signed)
 Good Morning Dr.Gessner,  Patient calling needing to reschedule upcoming procedure on 05/09/24 due to having a family emergency. Patient has been rescheduled for 06/19/24. Please advise

## 2024-05-09 ENCOUNTER — Encounter: Admitting: Internal Medicine

## 2024-05-23 ENCOUNTER — Ambulatory Visit: Admitting: Family Medicine

## 2024-06-04 ENCOUNTER — Ambulatory Visit: Admitting: Internal Medicine

## 2024-06-19 ENCOUNTER — Encounter: Admitting: Internal Medicine

## 2025-01-17 ENCOUNTER — Ambulatory Visit

## 2025-01-17 ENCOUNTER — Encounter: Admitting: Internal Medicine
